# Patient Record
Sex: Female | Born: 1990 | Race: White | Hispanic: No | Marital: Single | Smoking: Never smoker
Health system: Southern US, Community
[De-identification: ages and names within clinical notes are randomized; demographics above are authoritative.]

## PROBLEM LIST (undated history)

## (undated) DIAGNOSIS — K219 Gastro-esophageal reflux disease without esophagitis: Secondary | ICD-10-CM

## (undated) DIAGNOSIS — R519 Headache, unspecified: Secondary | ICD-10-CM

## (undated) DIAGNOSIS — Z789 Other specified health status: Secondary | ICD-10-CM

## (undated) DIAGNOSIS — F32A Depression, unspecified: Secondary | ICD-10-CM

## (undated) DIAGNOSIS — F419 Anxiety disorder, unspecified: Secondary | ICD-10-CM

## (undated) DIAGNOSIS — M1990 Unspecified osteoarthritis, unspecified site: Secondary | ICD-10-CM

## (undated) HISTORY — PX: NO PAST SURGERIES: SHX2092

## (undated) HISTORY — PX: TUBAL LIGATION: SHX77

---

## 2013-09-23 ENCOUNTER — Telehealth: Payer: Self-pay | Admitting: Nurse Practitioner

## 2013-09-23 NOTE — Telephone Encounter (Signed)
appt made

## 2013-09-29 ENCOUNTER — Ambulatory Visit (INDEPENDENT_AMBULATORY_CARE_PROVIDER_SITE_OTHER): Payer: Self-pay | Admitting: Family Medicine

## 2013-09-29 NOTE — Progress Notes (Signed)
   Subjective:    Patient ID: Kristin SheenJillian Haueter, female    DOB: 1991/04/30, 23 y.o.   MRN: [id number]  HPI   Review of Systems     Objective:   Physical Exam      Assessment & Plan:

## 2014-02-16 ENCOUNTER — Telehealth: Payer: Self-pay | Admitting: Family Medicine

## 2014-02-16 NOTE — Telephone Encounter (Signed)
appt given for tomorrow with mmm 

## 2014-02-17 ENCOUNTER — Ambulatory Visit: Payer: Self-pay | Admitting: Nurse Practitioner

## 2014-02-19 ENCOUNTER — Encounter (INDEPENDENT_AMBULATORY_CARE_PROVIDER_SITE_OTHER): Payer: Self-pay

## 2014-02-19 ENCOUNTER — Ambulatory Visit: Payer: Medicaid Other | Admitting: Family

## 2014-04-08 ENCOUNTER — Encounter (INDEPENDENT_AMBULATORY_CARE_PROVIDER_SITE_OTHER): Payer: Self-pay

## 2014-04-08 ENCOUNTER — Ambulatory Visit (INDEPENDENT_AMBULATORY_CARE_PROVIDER_SITE_OTHER): Payer: Medicaid Other | Admitting: Family

## 2014-04-08 ENCOUNTER — Encounter: Payer: Self-pay | Admitting: Family

## 2014-04-08 VITALS — BP 106/66 | HR 79 | Temp 99.1°F | Ht 64.0 in | Wt 212.0 lb

## 2014-04-08 DIAGNOSIS — N6320 Unspecified lump in the left breast, unspecified quadrant: Principal | ICD-10-CM

## 2014-04-08 DIAGNOSIS — N6310 Unspecified lump in the right breast, unspecified quadrant: Secondary | ICD-10-CM

## 2014-04-08 DIAGNOSIS — N63 Unspecified lump in unspecified breast: Secondary | ICD-10-CM

## 2014-04-08 NOTE — Patient Instructions (Signed)

## 2014-04-08 NOTE — Progress Notes (Signed)
   Subjective:    Patient ID: Kristin Saunders, female    DOB: October 28, 1990, 23 y.o.   MRN: [id number]  HPI Pt presents to the office for bilateral lumps in breasts. Pt states she noticed them about a month ago. Pt states she gets a "werid feeling and then sharp intermittent pain" in her breast. Pt denies any known trauma to the area except a "really bad sunburn". Pt denies any family hx of breast cancer.    Review of Systems  Constitutional: Negative.   HENT: Negative.   Eyes: Negative.   Respiratory: Negative.  Negative for shortness of breath.   Cardiovascular: Negative.  Negative for palpitations.  Gastrointestinal: Negative.   Endocrine: Negative.   Genitourinary: Negative.   Musculoskeletal: Negative.   Neurological: Negative.  Negative for headaches.  Hematological: Negative.   Psychiatric/Behavioral: Negative.   All other systems reviewed and are negative.      Objective:   Physical Exam  Vitals reviewed. Constitutional: She is oriented to person, place, and time. She appears well-developed and well-nourished. No distress.  Cardiovascular: Normal rate, regular rhythm, normal heart sounds and intact distal pulses.   No murmur heard. Pulmonary/Chest: Effort normal and breath sounds normal. No respiratory distress. She has no wheezes. Right breast exhibits tenderness. Right breast exhibits no inverted nipple, no mass (small nodule about nippled), no nipple discharge and no skin change. Left breast exhibits tenderness. Left breast exhibits no inverted nipple, no mass (small nodule above nipple), no nipple discharge and no skin change. Breasts are symmetrical.  Abdominal: Soft. Bowel sounds are normal. She exhibits no distension. There is no tenderness.  Musculoskeletal: Normal range of motion. She exhibits no edema and no tenderness.  Neurological: She is alert and oriented to person, place, and time. She has normal reflexes. No cranial nerve deficit.  Skin: Skin is warm and dry.    Psychiatric: She has a normal mood and affect. Her behavior is normal. Judgment and thought content normal.    BP 106/66  Pulse 79  Temp(Src) 99.1 F (37.3 C) (Oral)  Ht 5\' 4"  (1.626 m)  Wt 212 lb (96.163 kg)  BMI 36.37 kg/m2  LMP 03/04/2014      Assessment & Plan:  1. Bilateral breast lump -Mammogram scheduled- I do not believe that it is cancerous, and is more hormonal, but will do mammogram to rule out anything -Self breast exams monthly -RTO prn  Jannifer Rodneyhristy Hawks, FNP

## 2014-05-18 ENCOUNTER — Telehealth: Payer: Self-pay | Admitting: Family

## 2014-05-18 NOTE — Telephone Encounter (Signed)
Patient advised to call her OBGYN

## 2014-08-03 LAB — US OB COMP + 14 WK

## 2014-09-28 LAB — US OB FOLLOW UP

## 2014-09-30 ENCOUNTER — Encounter (HOSPITAL_COMMUNITY): Payer: Self-pay | Admitting: Unknown Physician Specialty

## 2014-09-30 ENCOUNTER — Other Ambulatory Visit (HOSPITAL_COMMUNITY): Payer: Self-pay | Admitting: Unknown Physician Specialty

## 2014-09-30 DIAGNOSIS — Z3689 Encounter for other specified antenatal screening: Secondary | ICD-10-CM

## 2014-09-30 DIAGNOSIS — O35EXX1 Maternal care for other (suspected) fetal abnormality and damage, fetal genitourinary anomalies, fetus 1: Secondary | ICD-10-CM

## 2014-09-30 DIAGNOSIS — O358XX1 Maternal care for other (suspected) fetal abnormality and damage, fetus 1: Secondary | ICD-10-CM

## 2014-09-30 DIAGNOSIS — O283 Abnormal ultrasonic finding on antenatal screening of mother: Secondary | ICD-10-CM

## 2014-10-08 ENCOUNTER — Ambulatory Visit (HOSPITAL_COMMUNITY): Admission: RE | Admit: 2014-10-08 | Payer: Medicaid Other | Source: Ambulatory Visit

## 2014-10-15 ENCOUNTER — Ambulatory Visit (HOSPITAL_COMMUNITY): Admission: RE | Admit: 2014-10-15 | Payer: Medicaid Other | Source: Ambulatory Visit

## 2014-11-04 ENCOUNTER — Other Ambulatory Visit (HOSPITAL_COMMUNITY): Payer: Self-pay | Admitting: Unknown Physician Specialty

## 2014-11-04 DIAGNOSIS — IMO0002 Reserved for concepts with insufficient information to code with codable children: Secondary | ICD-10-CM

## 2014-11-04 DIAGNOSIS — Z0489 Encounter for examination and observation for other specified reasons: Secondary | ICD-10-CM

## 2014-11-10 ENCOUNTER — Ambulatory Visit (HOSPITAL_COMMUNITY)
Admission: RE | Admit: 2014-11-10 | Discharge: 2014-11-10 | Disposition: A | Discharge status: Home / Self Care | Payer: Medicaid Other | Source: Ambulatory Visit | Attending: Unknown Physician Specialty | Admitting: Unknown Physician Specialty

## 2014-11-10 ENCOUNTER — Encounter (HOSPITAL_COMMUNITY): Payer: Self-pay

## 2014-11-10 DIAGNOSIS — O359XX0 Maternal care for (suspected) fetal abnormality and damage, unspecified, not applicable or unspecified: Secondary | ICD-10-CM | POA: Insufficient documentation

## 2014-11-10 DIAGNOSIS — IMO0002 Reserved for concepts with insufficient information to code with codable children: Secondary | ICD-10-CM

## 2014-11-10 DIAGNOSIS — Z3A35 35 weeks gestation of pregnancy: Secondary | ICD-10-CM | POA: Diagnosis not present

## 2014-11-10 DIAGNOSIS — Z3689 Encounter for other specified antenatal screening: Secondary | ICD-10-CM | POA: Insufficient documentation

## 2014-11-10 DIAGNOSIS — O283 Abnormal ultrasonic finding on antenatal screening of mother: Secondary | ICD-10-CM | POA: Insufficient documentation

## 2014-11-10 DIAGNOSIS — Z0489 Encounter for examination and observation for other specified reasons: Secondary | ICD-10-CM

## 2014-11-10 DIAGNOSIS — O358XX0 Maternal care for other (suspected) fetal abnormality and damage, not applicable or unspecified: Secondary | ICD-10-CM | POA: Insufficient documentation

## 2014-11-10 DIAGNOSIS — Z36 Encounter for antenatal screening of mother: Secondary | ICD-10-CM | POA: Diagnosis not present

## 2014-11-10 DIAGNOSIS — O35EXX0 Maternal care for other (suspected) fetal abnormality and damage, fetal genitourinary anomalies, not applicable or unspecified: Secondary | ICD-10-CM | POA: Insufficient documentation

## 2014-11-10 HISTORY — DX: Other specified health status: Z78.9

## 2014-11-10 IMAGING — US US OB DETAIL+14 WK
1 series · 12 of 28 positions shown · non-contrast
Comparison: none

[Series 1: us ob detail+14 wk · 0.26mm/px · 79 acquisitions, 12 frames shown]
[im 3/79]
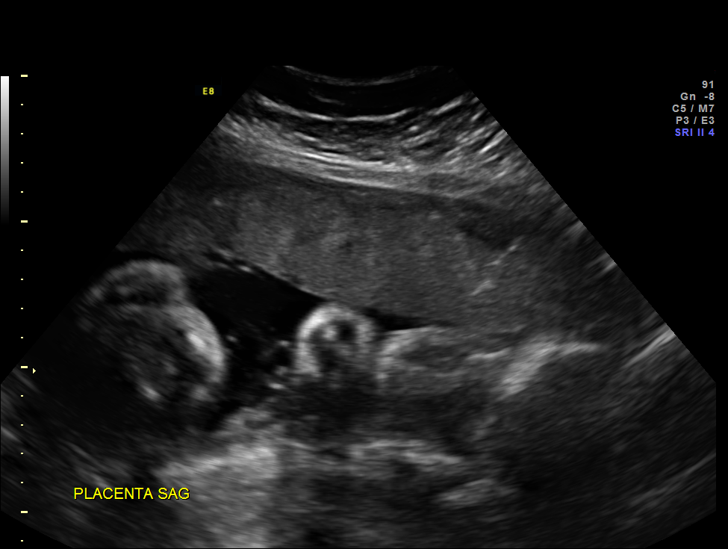
[im 9/79]
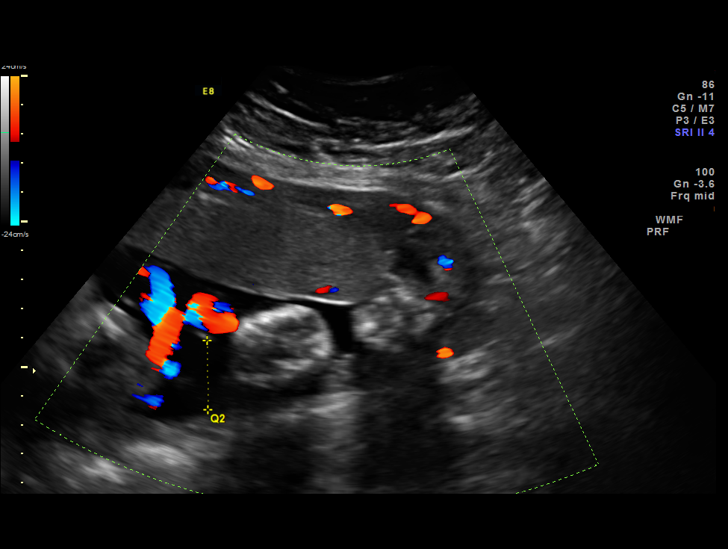
[im 15/79]
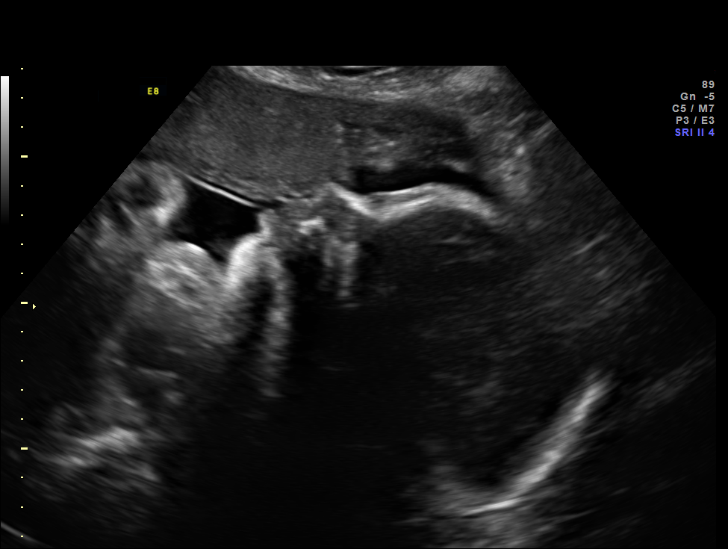
[im 24/79]
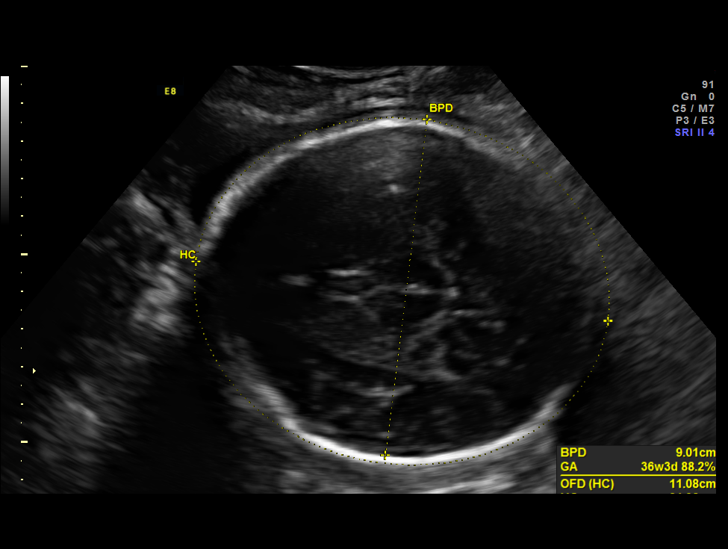
[im 29/79]
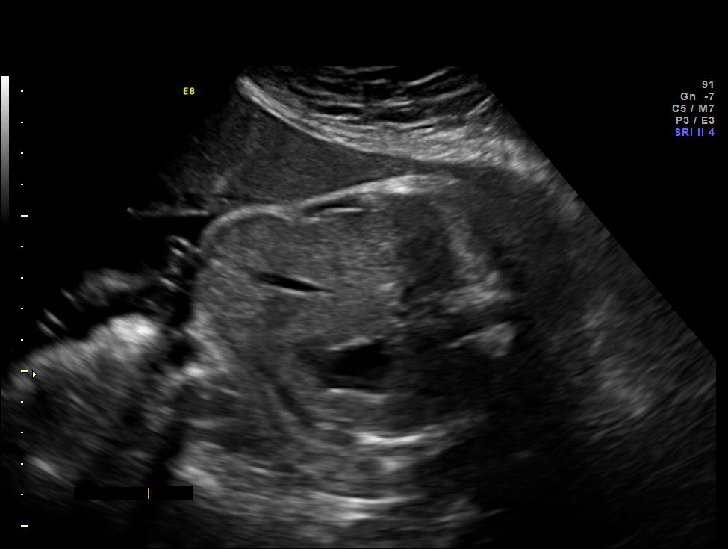
[im 35/79]
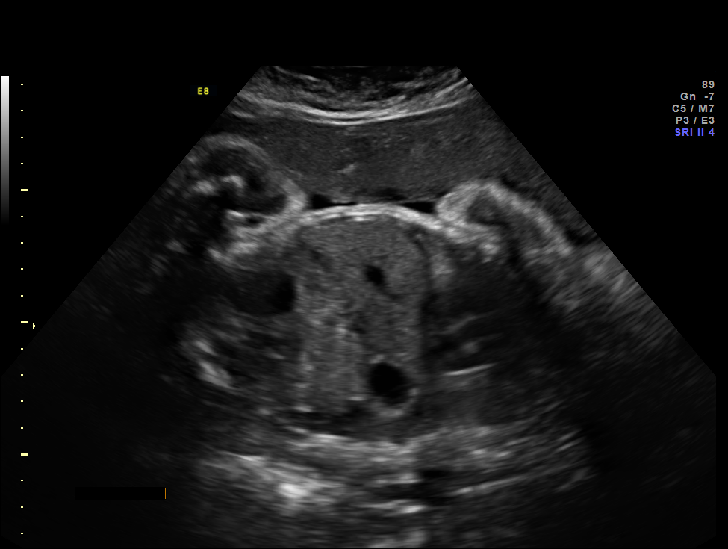
[im 44/79]
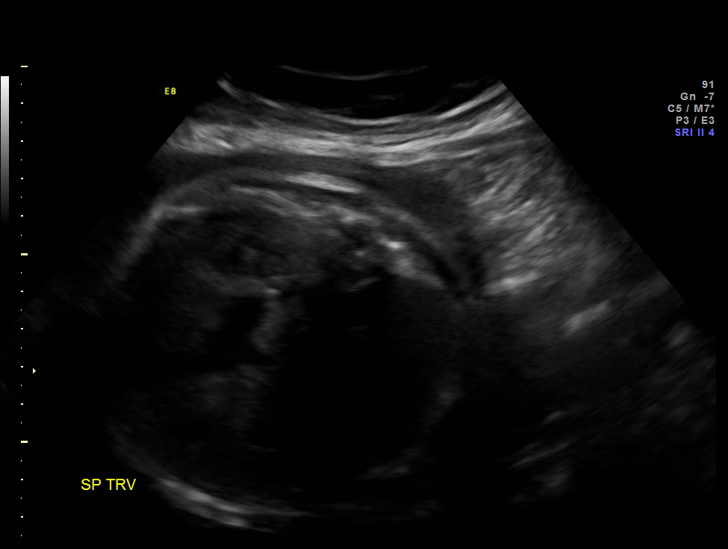
[im 50/79]
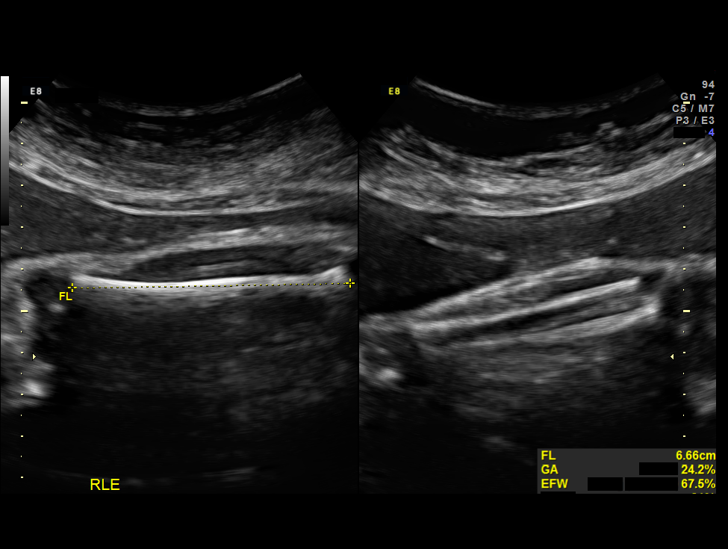
[im 55/79]
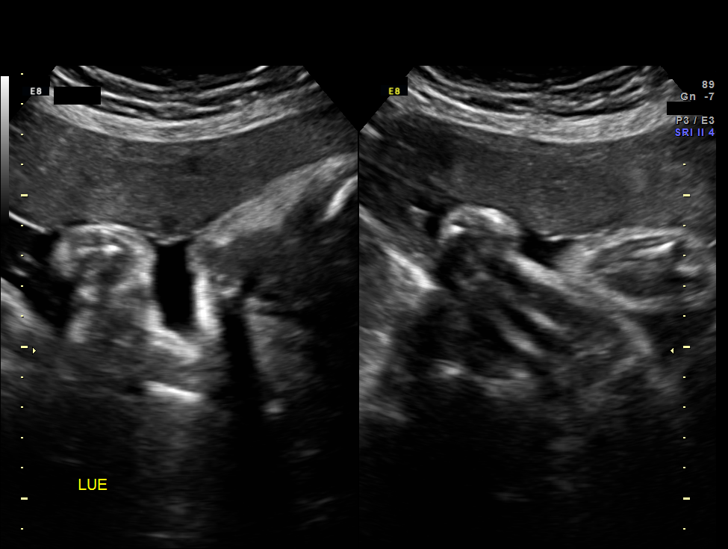
[im 64/79]
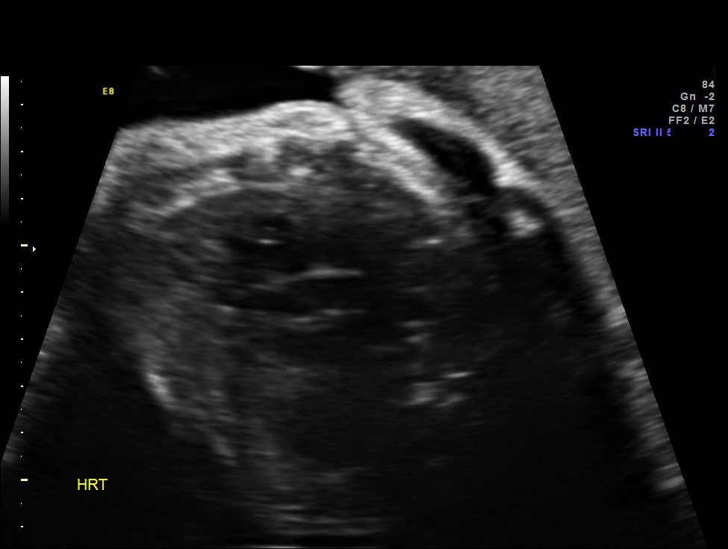
[im 70/79]
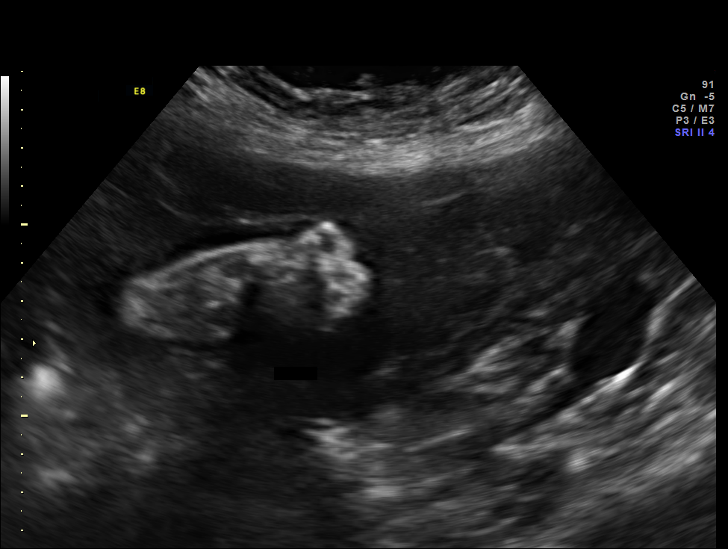
[im 76/79]
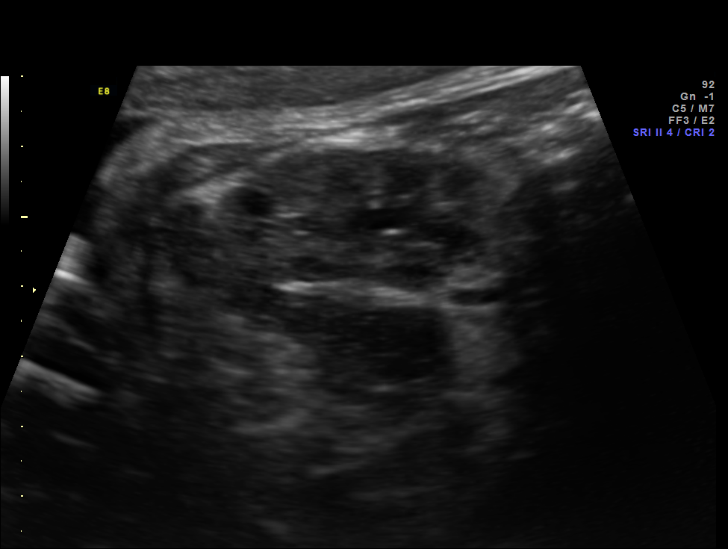

[12 of 28 positions shown; findings below may reference images not displayed]

OBSTETRICS REPORT
                      (Signed Final [DATE] [DATE])

Service(s) Provided

 US OB DETAIL + 14 WK                                  76811.0
Indications

 35 weeks gestation of pregnancy
 Detailed fetal anatomic survey                        Z36
 Pyelectasis of fetus on prenatal ultrasound           [IU]
Fetal Evaluation

 Num Of Fetuses:    1
 Fetal Heart Rate:  148                          bpm
 Cardiac Activity:  Observed
 Presentation:      Cephalic
 Placenta:          Anterior, above cervical os
 P. Cord            Visualized, central
 Insertion:

 Amniotic Fluid
 AFI FV:      Subjectively within normal limits
 AFI Sum:     11.9    cm       35  %Tile     Larg Pckt:    5.94  cm
 RUQ:   5.94    cm   LUQ:    2.38   cm    LLQ:   3.58    cm
Biometry

 BPD:     89.5  mm     G. Age:  36w 2d                CI:         83.0   70 - 86
 OFD:    107.8  mm                                    FL/HC:      21.1   20.1 -

 HC:     314.7  mm     G. Age:  35w 2d       24  %    HC/AC:      0.99   0.93 -

 AC:     318.6  mm     G. Age:  35w 5d       77  %    FL/BPD:     74.2   71 - 87
 FL:      66.4  mm     G. Age:  34w 1d       23  %    FL/AC:      20.8   20 - 24
 HUM:     58.7  mm     G. Age:  34w 0d       47  %
 CER:     45.2  mm     G. Age:  N/A          88  %

 Est. FW:    [IU]  gm    5 lb 14 oz      69  %
Gestational Age

 LMP:           36w 0d        Date:  [DATE]                 EDD:   [DATE]
 U/S Today:     35w 3d                                        EDD:   [DATE]
 Best:          35w 0d     Det. By:  Early Ultrasound         EDD:   [DATE]
                                     ([DATE])
Anatomy

 Cranium:          Appears normal         Aortic Arch:      Appears normal
 Fetal Cavum:      Appears normal         Ductal Arch:      Appears normal
                                                            (color)
 Ventricles:       Appears normal         Diaphragm:        Appears normal
 Choroid Plexus:   Appears normal         Stomach:          Appears normal, left
                                                            sided
 Cerebellum:       Appears normal         Abdomen:          Appears normal
 Posterior Fossa:  Appears normal         Abdominal Wall:   Appears nml (cord
                                                            insert, abd wall)
 Nuchal Fold:      Not applicable (>20    Cord Vessels:     Appears normal (3
                   wks GA)                                  vessel cord)
 Face:             Appears normal         Kidneys:          Appear normal
                   (orbits and profile)
 Lips:             Appears normal         Bladder:          Appears normal
 Heart:            Appears normal         Spine:            Appears normal
                   (4CH, axis, and
                   situs)
 RVOT:             Appears normal         Lower             Visualized
                                          Extremities:
 LVOT:             Appears normal         Upper             Visualized
                                          Extremities:

 Other:  Fetus appears to be a female. Nasal bone visualized. Technically
         difficult due to advanced GA and fetal position.
Targeted Anatomy

 Fetal Central Nervous System
 Cisterna Magna:
Cervix Uterus Adnexa

 Cervix:       Not visualized (advanced GA >[IU])

 Adnexa:     No abnormality visualized.
Impression

 SIUP at 35+0 weeks
 Normal detailed fetal anatomy; no pyelectasis identified
 Normal amniotic fluid volume
 Measurements consistent with early US; EFW at the 69th
 %tile
Recommendations

 Follow-up as clinically indicated

 questions or concerns.

## 2014-11-13 ENCOUNTER — Other Ambulatory Visit (HOSPITAL_COMMUNITY): Payer: Self-pay | Admitting: Unknown Physician Specialty

## 2015-04-06 ENCOUNTER — Encounter: Payer: Self-pay | Admitting: Family Medicine

## 2015-04-06 ENCOUNTER — Ambulatory Visit (INDEPENDENT_AMBULATORY_CARE_PROVIDER_SITE_OTHER): Payer: Medicaid Other

## 2015-04-06 ENCOUNTER — Ambulatory Visit (INDEPENDENT_AMBULATORY_CARE_PROVIDER_SITE_OTHER): Payer: Medicaid Other | Admitting: Family Medicine

## 2015-04-06 VITALS — BP 108/69 | HR 83 | Temp 97.6°F | Ht 64.0 in | Wt 206.6 lb

## 2015-04-06 DIAGNOSIS — M25572 Pain in left ankle and joints of left foot: Secondary | ICD-10-CM | POA: Diagnosis not present

## 2015-04-06 IMAGING — CR DG ANKLE COMPLETE 3+V*L*
3 series · 3 of 3 positions shown · non-contrast
Comparison: None.

CLINICAL DATA: Missed the last step twisting ankle with pain and
swelling

EXAM:
LEFT ANKLE COMPLETE - 3+ VIEW

[view not recorded (1 of 3)]
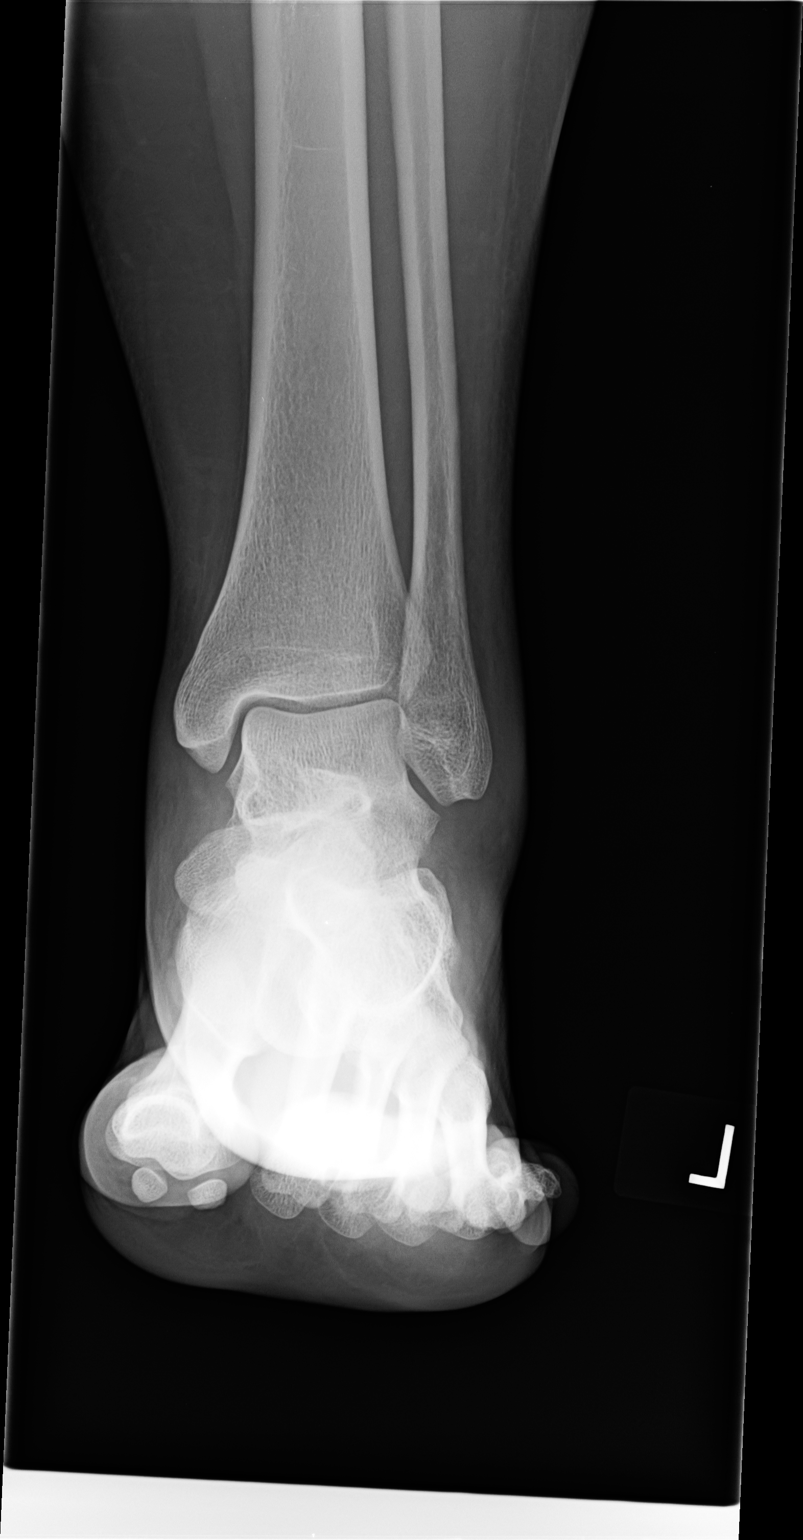

[view not recorded (2 of 3)]
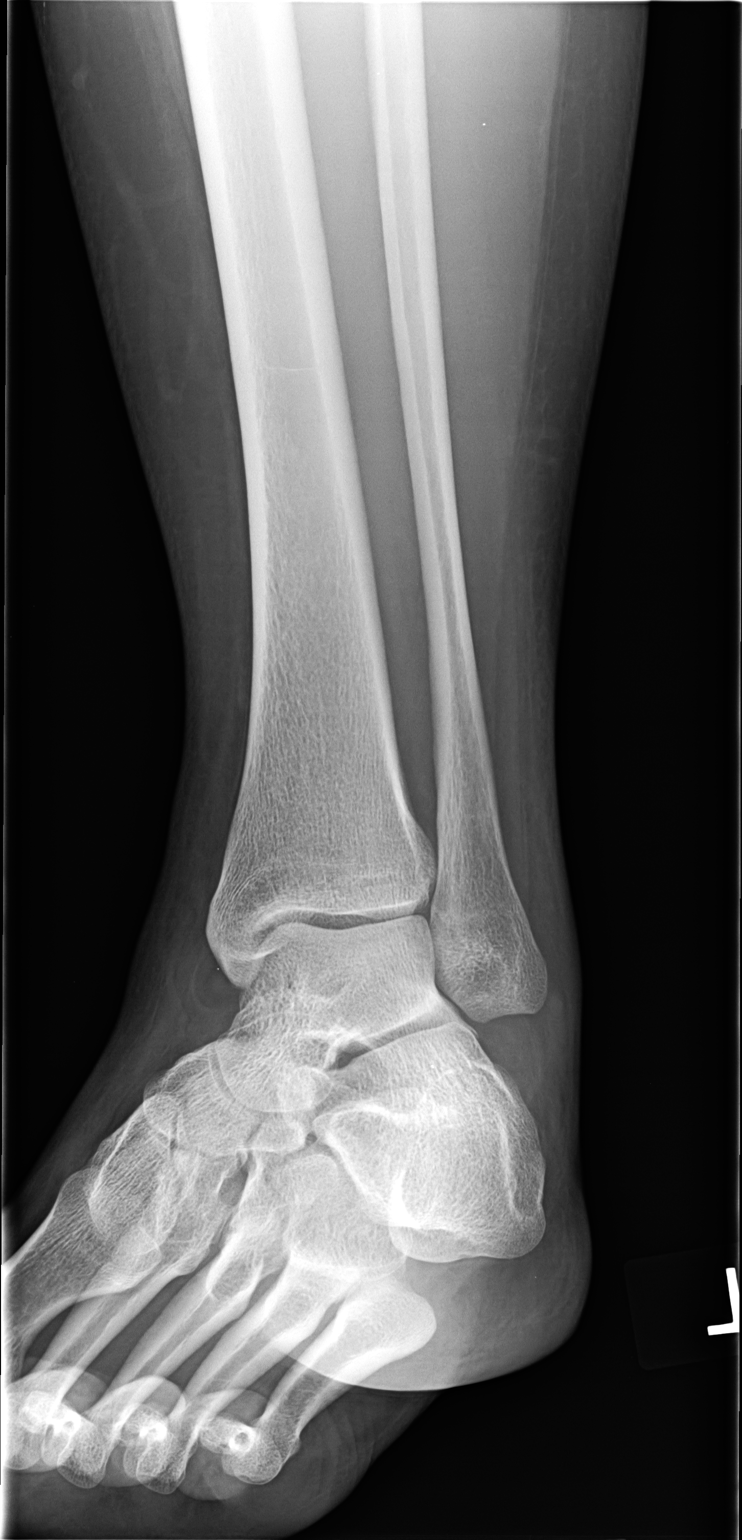

[view not recorded (3 of 3)]
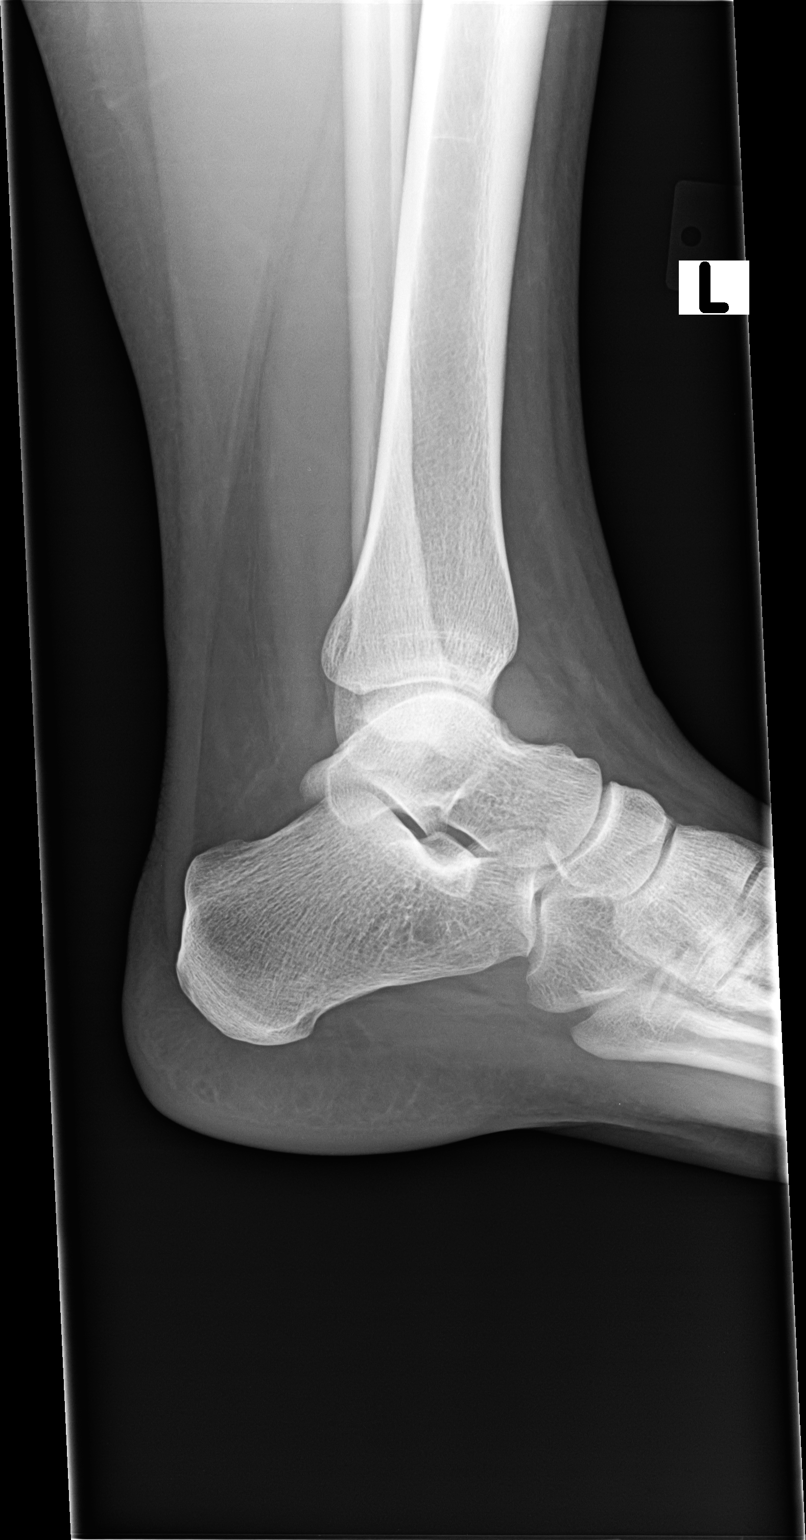

[3 of 3 positions shown; findings below may reference images not displayed]

FINDINGS: The ankle joint appears normal. No fracture is seen. There may be a
small ankle joint effusion on the lateral view. Alignment is normal.
IMPRESSION: No fracture.  Cannot exclude a small left ankle joint effusion.

## 2015-04-06 MED ORDER — ANKLE STIRRUP BRACE/LEFT MISC: Status: DC

## 2015-04-06 MED ORDER — DICLOFENAC SODIUM 75 MG PO TBEC: 75.0000 mg | DELAYED_RELEASE_TABLET | Freq: Two times a day (BID) | ORAL | Status: DC

## 2015-04-06 NOTE — Progress Notes (Signed)
Subjective:  Patient ID: Kristin Saunders, female    DOB: [birth year]  Age: 24 y.o. MRN: [id number]  CC: Ankle Pain   HPI [name] presents for patient to twisted her ankle ambulating on uneven ground about 10 days ago. Since that time it has been painful. She does not do anything in particular to take care of it. She has applied ice on several occasions. Pain is rated at 4/10. It just doesn't seem to be getting better like she thought it would.  History [name] has a past medical history of Medical history non-contributory.   She has past surgical history that includes No past surgeries.   Her family history includes Cancer in her father.She reports that she has never smoked. She does not have any smokeless tobacco history on file. She reports that she drinks alcohol. She reports that she does not use illicit drugs.  Outpatient Prescriptions Prior to Visit  Medication Sig Dispense Refill  . Prenatal Multivit-Min-Fe-FA (PRENATAL VITAMINS PO) Take by mouth.     No facility-administered medications prior to visit.    ROS Review of Systems  Constitutional: Negative for fever, chills, diaphoresis, appetite change and fatigue.  HENT: Negative for congestion, ear pain, hearing loss, postnasal drip, rhinorrhea, sore throat and trouble swallowing.   Respiratory: Negative for cough, chest tightness and shortness of breath.   Cardiovascular: Negative for chest pain and palpitations.  Gastrointestinal: Negative for abdominal pain.  Musculoskeletal: Negative for arthralgias (except as noted in istory of present illne).  Skin: Negative for rash.    Objective:  BP 108/69 mmHg  Pulse 83  Temp(Src) 97.6 F (36.4 C) (Oral)  Ht  (1.626 m)  Wt 206 lb 9.6 oz (93.713 kg)  BMI 35.45 kg/m2  LMP 02/23/2014 (Approximate)  Breastfeeding? Unknown  BP Readings from Last 3 Encounters:  04/06/15 108/69  11/10/14 99/61  04/08/14 106/66    Wt Readings from Last 3 Encounters:  04/06/15  206 lb 9.6 oz (93.713 kg)  11/10/14 214 lb (97.07 kg)  04/08/14 212 lb (96.163 kg)     Physical Exam  Constitutional: She appears well-developed and well-nourished. No distress.  HENT:  Head: Normocephalic.  Cardiovascular: Normal rate and regular rhythm.   No murmur heard. Pulmonary/Chest: Effort normal and breath sounds normal.  Musculoskeletal: She exhibits edema and tenderness (both at right ankle. The lateral malleolus has 2+ edema and moderate tenderness).    No results found for: HGBA1C  No results found for: WBC, HGB, HCT, PLT, GLUCOSE, CHOL, TRIG, HDL, LDLDIRECT, LDLCALC, ALT, AST, NA, K, CL, CREATININE, BUN, CO2, TSH, PSA, INR, GLUF, HGBA1C, MICROALBUR  US Ob Detail + 14 Wk  11/10/2014   OBSTETRICAL ULTRASOUND: This exam was performed within a [facility]. The OB US report was generated in the [facility] system, and faxed to the ordering physician.   This report is available in the [employer]. See the [facility] Obstetric US report via the Image Link.   Assessment & Plan:   Athea was seen today for ankle pain.  Diagnoses and all orders for this visit:  Left lateral ankle pain -     DG Ankle Complete Left; Future  Other orders -     Elastic Bandages & Supports (ANKLE STIRRUP BRACE/LEFT) MISC; Wear daily until pain, swelling and weakness resolve -     diclofenac (VOLTAREN) 75 MG EC tablet; Take 1 tablet (75 mg total) by mouth 2 (two) times daily.  I have discontinued [name]'s Prenatal Multivit-Min-Fe-FA (PRENATAL  VITAMINS PO). I am also having her start on Ankle Stirrup Brace/Left and diclofenac.  Meds ordered this encounter  Medications  . Elastic Bandages & Supports (ANKLE STIRRUP BRACE/LEFT) MISC    Sig: Wear daily until pain, swelling and weakness resolve    Dispense:  1 each    Refill:  0  . diclofenac (VOLTAREN) 75 MG EC tablet    Sig: Take 1 tablet (75 mg total) by mouth 2 (two) times daily.    Dispense:  60 tablet    Refill:  2     Preliminary reading x-ray shows no apparent fracture. Perhaps minimal soft tissue swelling. Follow-up: Return in about 2 weeks (around 04/20/2015).  [name], M.D.

## 2015-04-06 NOTE — Patient Instructions (Signed)
Ankle Sprain °An ankle sprain is an injury to the strong, fibrous tissues (ligaments) that hold the bones of your ankle joint together.  °CAUSES °An ankle sprain is usually caused by a fall or by twisting your ankle. Ankle sprains most commonly occur when you step on the outer edge of your foot, and your ankle turns inward. People who participate in sports are more prone to these types of injuries.  °SYMPTOMS  °· Pain in your ankle. The pain may be present at rest or only when you are trying to stand or walk. °· Swelling. °· Bruising. Bruising may develop immediately or within 1 to 2 days after your injury. °· Difficulty standing or walking, particularly when turning corners or changing directions. °DIAGNOSIS  °Your caregiver will ask you details about your injury and perform a physical exam of your ankle to determine if you have an ankle sprain. During the physical exam, your caregiver will press on and apply pressure to specific areas of your foot and ankle. Your caregiver will try to move your ankle in certain ways. An X-ray exam may be done to be sure a bone was not broken or a ligament did not separate from one of the bones in your ankle (avulsion fracture).  °TREATMENT  °Certain types of braces can help stabilize your ankle. Your caregiver can make a recommendation for this. Your caregiver may recommend the use of medicine for pain. If your sprain is severe, your caregiver may refer you to a surgeon who helps to restore function to parts of your skeletal system (orthopedist) or a physical therapist. °HOME CARE INSTRUCTIONS  °· Apply ice to your injury for 1-2 days or as directed by your caregiver. Applying ice helps to reduce inflammation and pain. °¨ Put ice in a plastic bag. °¨ Place a towel between your skin and the bag. °¨ Leave the ice on for 15-20 minutes at a time, every 2 hours while you are awake. °· Only take over-the-counter or prescription medicines for pain, discomfort, or fever as directed by  your caregiver. °· Elevate your injured ankle above the level of your heart as much as possible for 2-3 days. °· If your caregiver recommends crutches, use them as instructed. Gradually put weight on the affected ankle. Continue to use crutches or a cane until you can walk without feeling pain in your ankle. °· If you have a plaster splint, wear the splint as directed by your caregiver. Do not rest it on anything harder than a pillow for the first 24 hours. Do not put weight on it. Do not get it wet. You may take it off to take a shower or bath. °· You may have been given an elastic bandage to wear around your ankle to provide support. If the elastic bandage is too tight (you have numbness or tingling in your foot or your foot becomes cold and blue), adjust the bandage to make it comfortable. °· If you have an air splint, you may blow more air into it or let air out to make it more comfortable. You may take your splint off at night and before taking a shower or bath. Wiggle your toes in the splint several times per day to decrease swelling. °SEEK MEDICAL CARE IF:  °· You have rapidly increasing bruising or swelling. °· Your toes feel extremely cold or you lose feeling in your foot. °· Your pain is not relieved with medicine. °SEEK IMMEDIATE MEDICAL CARE IF: °· Your toes are numb or blue. °·   You have severe pain that is increasing. °MAKE SURE YOU:  °· Understand these instructions. °· Will watch your condition. °· Will get help right away if you are not doing well or get worse. °Document Released: 08/07/2005 Document Revised: 05/01/2012 Document Reviewed: 08/19/2011 °ExitCare® Patient Information ©2015 [employer]. This information is not intended to replace advice given to you by your health care provider. Make sure you discuss any questions you have with your health care provider. ° ° °Ankle Exercises for Rehabilitation °Following ankle injuries, it is as important to follow your caregiver's instructions for  regaining full use of your ankle as it was to follow the initial treatment plan following the injury. The following are some suggestions for exercises and treatment, which can be done to help you regain full use of your ankle as soon as possible. °· Follow all instructions regarding physical therapy. °· Before exercising, it may be helpful to use heat on the muscles or joint being exercised. This loosens up the muscles and tendons (cordlike structure) and decreases chances of injury during your exercises. If this is not possible, just begin your exercises slowly to gradually warm up. °· Stand on your toes several times per day to strengthen the calf muscles. These are the muscles in the back of your leg between the knee and the heel. The cord you can feel just above the heel is the Achilles tendon. Rise up on your toes several times repeating this three to four times per day. Do not exercise to the point of pain. If pain starts to develop, decrease the exercise until you are comfortable again. °· Do range of motion exercises. This means moving the ankle in all directions. Practice writing the alphabet with your toes in the air. Do not increase beyond a range that is comfortable. °· Increase the strength of the muscles in the front of your leg by raising your toes and foot straight up in the air. Repeat this exercise as you did the calf exercise with the same warnings. This also help to stretch your muscles. °· Stretch your calf muscles also by leaning against a wall with your hands in front of you. Put your feet a few feet from the wall and bend your knees until you feel the muscles in your calves become tight. °· After exercising it may be helpful to put ice on the ankle to prevent swelling and improve rehabilitation. This may be done for 15 to 20 minutes following your exercises. If exercising is being done in the workplace, this may not always be possible. °· Taping an ankle injury may be helpful to give added  support following an injury. It also may help prevent reinjury. This may be true if you are in training or in a conditioning program. You and your caregiver can decide on the best course of action to follow. °Document Released: 08/04/2000 Document Revised: 12/22/2013 Document Reviewed: 08/01/2008 °ExitCare® Patient Information ©2015 [employer]. This information is not intended to replace advice given to you by your health care provider. Make sure you discuss any questions you have with your health care provider. ° ° °

## 2015-04-08 ENCOUNTER — Encounter: Payer: Self-pay | Admitting: Family Medicine

## 2015-07-23 ENCOUNTER — Encounter: Payer: Self-pay | Admitting: Family

## 2015-07-23 ENCOUNTER — Ambulatory Visit (INDEPENDENT_AMBULATORY_CARE_PROVIDER_SITE_OTHER): Payer: Medicaid Other | Admitting: Family

## 2015-07-23 VITALS — BP 106/72 | HR 89 | Temp 97.9°F | Ht 64.0 in | Wt 203.6 lb

## 2015-07-23 DIAGNOSIS — M722 Plantar fascial fibromatosis: Secondary | ICD-10-CM | POA: Diagnosis not present

## 2015-07-23 DIAGNOSIS — Z23 Encounter for immunization: Secondary | ICD-10-CM | POA: Diagnosis not present

## 2015-07-23 MED ORDER — PREDNISONE 10 MG (21) PO TBPK: 10.0000 mg | ORAL_TABLET | Freq: Every day | ORAL | Status: DC

## 2015-07-23 MED ORDER — NAPROXEN 500 MG PO TABS: 500.0000 mg | ORAL_TABLET | Freq: Two times a day (BID) | ORAL | Status: DC

## 2015-07-23 NOTE — Patient Instructions (Signed)
Plantar Fasciitis Plantar fasciitis is a painful foot condition that affects the heel. It occurs when the band of tissue that connects the toes to the heel bone (plantar fascia) becomes irritated. This can happen after exercising too much or doing other repetitive activities (overuse injury). The pain from plantar fasciitis can range from mild irritation to severe pain that makes it difficult for you to walk or move. The pain is usually worse in the morning or after you have been sitting or lying down for a while. CAUSES This condition may be caused by:  Standing for long periods of time.  Wearing shoes that do not fit.  Doing high-impact activities, including running, aerobics, and ballet.  Being overweight.  Having an abnormal way of walking (gait).  Having tight calf muscles.  Having high arches in your feet.  Starting a new athletic activity. SYMPTOMS The main symptom of this condition is heel pain. Other symptoms include:  Pain that gets worse after activity or exercise.  Pain that is worse in the morning or after resting.  Pain that goes away after you walk for a few minutes. DIAGNOSIS This condition may be diagnosed based on your signs and symptoms. Your health care provider will also do a physical exam to check for:  A tender area on the bottom of your foot.  A high arch in your foot.  Pain when you move your foot.  Difficulty moving your foot. You may also need to have imaging studies to confirm the diagnosis. These can include:  X-rays.  Ultrasound.  MRI. TREATMENT  Treatment for plantar fasciitis depends on the severity of the condition. Your treatment may include:  Rest, ice, and over-the-counter pain medicines to manage your pain.  Exercises to stretch your calves and your plantar fascia.  A splint that holds your foot in a stretched, upward position while you sleep (night splint).  Physical therapy to relieve symptoms and prevent problems in the  future.  Cortisone injections to relieve severe pain.  Extracorporeal shock wave therapy (ESWT) to stimulate damaged plantar fascia with electrical impulses. It is often used as a last resort before surgery.  Surgery, if other treatments have not worked after 12 months. HOME CARE INSTRUCTIONS  Take medicines only as directed by your health care provider.  Avoid activities that cause pain.  Roll the bottom of your foot over a bag of ice or a bottle of cold water. Do this for 20 minutes, 3-4 times a day.  Perform simple stretches as directed by your health care provider.  Try wearing athletic shoes with air-sole or gel-sole cushions or soft shoe inserts.  Wear a night splint while sleeping, if directed by your health care provider.  Keep all follow-up appointments with your health care provider. PREVENTION   Do not perform exercises or activities that cause heel pain.  Consider finding low-impact activities if you continue to have problems.  Lose weight if you need to. The best way to prevent plantar fasciitis is to avoid the activities that aggravate your plantar fascia. SEEK MEDICAL CARE IF:  Your symptoms do not go away after treatment with home care measures.  Your pain gets worse.  Your pain affects your ability to move or do your daily activities.   This information is not intended to replace advice given to you by your health care provider. Make sure you discuss any questions you have with your health care provider.   Document Released: 05/02/2001 Document Revised: 04/28/2015 Document Reviewed: 06/17/2014 Elsevier   Interactive Patient Education 2016 [employer]  

## 2015-07-23 NOTE — Progress Notes (Signed)
   Subjective:    Patient ID: Kristin Saunders, female    DOB: 01-Aug-1991, 24 y.o.   MRN: [id number]  Foot Pain This is a new problem. The current episode started more than 1 month ago. The problem occurs intermittently. The problem has been waxing and waning. Pertinent negatives include no arthralgias, congestion, headaches, neck pain or swollen glands. The symptoms are aggravated by walking and standing. She has tried rest and acetaminophen for the symptoms. The treatment provided mild relief.      Review of Systems  Constitutional: Negative.   HENT: Negative.  Negative for congestion.   Eyes: Negative.   Respiratory: Negative.  Negative for shortness of breath.   Cardiovascular: Negative.  Negative for palpitations.  Gastrointestinal: Negative.   Endocrine: Negative.   Genitourinary: Negative.   Musculoskeletal: Negative.  Negative for arthralgias and neck pain.  Neurological: Negative.  Negative for headaches.  Hematological: Negative.   Psychiatric/Behavioral: Negative.   All other systems reviewed and are negative.      Objective:   Physical Exam  Constitutional: She is oriented to person, place, and time. She appears well-developed and well-nourished. No distress.  HENT:  Head: Normocephalic and atraumatic.  Eyes: Pupils are equal, round, and reactive to light.  Neck: Normal range of motion. Neck supple. No thyromegaly present.  Cardiovascular: Normal rate, regular rhythm, normal heart sounds and intact distal pulses.   No murmur heard. Pulmonary/Chest: Effort normal and breath sounds normal. No respiratory distress. She has no wheezes.  Abdominal: Soft. Bowel sounds are normal. She exhibits no distension. There is no tenderness.  Musculoskeletal: Normal range of motion. She exhibits no edema or tenderness.  Neurological: She is alert and oriented to person, place, and time. She has normal reflexes. No cranial nerve deficit.  Skin: Skin is warm and dry.  Psychiatric: She  has a normal mood and affect. Her behavior is normal. Judgment and thought content normal.  Vitals reviewed.   BP 106/72 mmHg  Pulse 89  Temp(Src) 97.9 F (36.6 C) (Oral)  Ht 5\' 4"  (1.626 m)  Wt 203 lb 9.6 oz (92.352 kg)  BMI 34.93 kg/m2  LMP 03/04/2014       Assessment & Plan:  1. Plantar fasciitis of left foot -Encouraged patient to lose weight -Take naproxen with food -Freeze a bottle of water and roll from heel to toe 20 min 2-3 times a day -Good support in shoes discussed -RTO prn  - naproxen (NAPROSYN) 500 MG tablet; Take 1 tablet (500 mg total) by mouth 2 (two) times daily with a meal.  Dispense: 30 tablet; Refill: 0 - predniSONE (STERAPRED UNI-PAK 21 TAB) 10 MG (21) TBPK tablet; Take 1 tablet (10 mg total) by mouth daily. As directed x 6 days  Dispense: 21 tablet; Refill: 0  Kristin Rodneyhristy Devine Klingel, FNP

## 2015-10-29 ENCOUNTER — Encounter: Payer: Self-pay | Admitting: Family

## 2015-10-29 ENCOUNTER — Ambulatory Visit (INDEPENDENT_AMBULATORY_CARE_PROVIDER_SITE_OTHER): Payer: Medicaid Other | Admitting: Family

## 2015-10-29 VITALS — BP 99/67 | HR 85 | Temp 99.4°F | Ht 64.0 in | Wt 202.0 lb

## 2015-10-29 DIAGNOSIS — L089 Local infection of the skin and subcutaneous tissue, unspecified: Secondary | ICD-10-CM

## 2015-10-29 DIAGNOSIS — S60451A Superficial foreign body of left index finger, initial encounter: Secondary | ICD-10-CM

## 2015-10-29 DIAGNOSIS — S60459A Superficial foreign body of unspecified finger, initial encounter: Principal | ICD-10-CM

## 2015-10-29 MED ORDER — SULFAMETHOXAZOLE-TRIMETHOPRIM 800-160 MG PO TABS: 1.0000 | ORAL_TABLET | Freq: Two times a day (BID) | ORAL | Status: DC

## 2015-10-29 MED ORDER — MUPIROCIN 2 % EX OINT: 1.0000 "application " | TOPICAL_OINTMENT | Freq: Two times a day (BID) | CUTANEOUS | Status: DC

## 2015-10-29 NOTE — Patient Instructions (Addendum)
Wound Care °Taking care of your wound properly can help to prevent pain and infection. It can also help your wound to heal more quickly.  °HOW TO CARE FOR YOUR WOUND  °· Take or apply over-the-counter and prescription medicines only as told by your health care provider. °· If you were prescribed antibiotic medicine, take or apply it as told by your health care provider. Do not stop using the antibiotic even if your condition improves. °· Clean the wound each day or as told by your health care provider. °¨ Wash the wound with mild soap and water. °¨ Rinse the wound with water to remove all soap. °¨ Pat the wound dry with a clean towel. Do not rub it. °· There are many different ways to close and cover a wound. For example, a wound can be covered with stitches (sutures), skin glue, or adhesive strips. Follow instructions from your health care provider about: °¨ How to take care of your wound. °¨ When and how you should change your bandage (dressing). °¨ When you should remove your dressing. °¨ Removing whatever was used to close your wound. °· Check your wound every day for signs of infection. Watch for: °¨ Redness, swelling, or pain. °¨ Fluid, blood, or pus. °· Keep the dressing dry until your health care provider says it can be removed. Do not take baths, swim, use a hot tub, or do anything that would put your wound underwater until your health care provider approves. °· Raise (elevate) the injured area above the level of your heart while you are sitting or lying down. °· Do not scratch or pick at the wound. °· Keep all follow-up visits as told by your health care provider. This is important. °SEEK MEDICAL CARE IF: °· You received a tetanus shot and you have swelling, severe pain, redness, or bleeding at the injection site. °· You have a fever. °· Your pain is not controlled with medicine. °· You have increased redness, swelling, or pain at the site of your wound. °· You have fluid, blood, or pus coming from your  wound. °· You notice a bad smell coming from your wound or your dressing. °SEEK IMMEDIATE MEDICAL CARE IF: °· You have a red streak going away from your wound. °  °This information is not intended to replace advice given to you by your health care provider. Make sure you discuss any questions you have with your health care provider. °  °Document Released: 05/16/2008 Document Revised: 12/22/2014 Document Reviewed: 08/03/2014 °Elsevier Interactive Patient Education ©2016 Else[employer] Inc[employer] ° °

## 2015-10-29 NOTE — Progress Notes (Signed)
   Subjective:    Patient ID: Kristin SheenJillian Saunders, female    DOB: 1991/01/25, 25 y.o.   MRN: [id number]  HPI PT presents to the officce today for a splinter in her right index finger. Pt states she was "redoing her wooden floors and hit my finger on a piece of wood". Pt states the area has since become red and swollen and is having constant pain of 10 out 10. PT states she has soaked it in ice water and peroxide with no relief.    Review of Systems  Constitutional: Negative.   HENT: Negative.   Eyes: Negative.   Respiratory: Negative.  Negative for shortness of breath.   Cardiovascular: Negative.  Negative for palpitations.  Gastrointestinal: Negative.   Endocrine: Negative.   Genitourinary: Negative.   Musculoskeletal: Negative.   Neurological: Negative.  Negative for headaches.  Hematological: Negative.   Psychiatric/Behavioral: Negative.   All other systems reviewed and are negative.      Objective:   Physical Exam  Constitutional: She is oriented to person, place, and time. She appears well-developed and well-nourished. No distress.  HENT:  Mouth/Throat: Oropharynx is clear and moist.  Cardiovascular: Normal rate, regular rhythm, normal heart sounds and intact distal pulses.   No murmur heard. Pulmonary/Chest: Effort normal and breath sounds normal. No respiratory distress. She has no wheezes.  Musculoskeletal: Normal range of motion. She exhibits no edema.  Neurological: She is alert and oriented to person, place, and time.  Skin: Skin is warm and dry.  Psychiatric: She has a normal mood and affect. Her behavior is normal. Judgment and thought content normal.  Vitals reviewed.   Removed small splinter of right index finger. Mupirocin ointment applied      Assessment & Plan:  1. Foreign body in finger-infected, initial encounter - sulfamethoxazole-trimethoprim (BACTRIM DS) 800-160 MG tablet; Take 1 tablet by mouth 2 (two) times daily.  Dispense: 14 tablet; Refill: 0 -  mupirocin ointment (BACTROBAN) 2 %; Place 1 application into the nose 2 (two) times daily.  Dispense: 22 g; Refill: 0  2. Infection of index finger - sulfamethoxazole-trimethoprim (BACTRIM DS) 800-160 MG tablet; Take 1 tablet by mouth 2 (two) times daily.  Dispense: 14 tablet; Refill: 0 - mupirocin ointment (BACTROBAN) 2 %; Place 1 application into the nose 2 (two) times daily.  Dispense: 22 g; Refill: 0  Keep clean and dry Do not squeeze or pick at RTO prn  Jannifer Rodneyhristy Catricia Scheerer, FNP

## 2016-10-23 DIAGNOSIS — R079 Chest pain, unspecified: Secondary | ICD-10-CM | POA: Diagnosis not present

## 2016-10-23 DIAGNOSIS — R0789 Other chest pain: Secondary | ICD-10-CM | POA: Diagnosis not present

## 2018-01-07 ENCOUNTER — Encounter: Payer: Self-pay | Admitting: Pediatrics

## 2018-01-07 ENCOUNTER — Ambulatory Visit (INDEPENDENT_AMBULATORY_CARE_PROVIDER_SITE_OTHER): Payer: Medicaid Other | Admitting: Pediatrics

## 2018-01-07 VITALS — BP 92/65 | HR 116 | Temp 99.5°F | Resp 22 | Ht 64.0 in | Wt 190.0 lb

## 2018-01-07 DIAGNOSIS — R0981 Nasal congestion: Secondary | ICD-10-CM

## 2018-01-07 DIAGNOSIS — H6501 Acute serous otitis media, right ear: Secondary | ICD-10-CM

## 2018-01-07 MED ORDER — CETIRIZINE HCL 10 MG PO TABS: 10.0000 mg | ORAL_TABLET | Freq: Every day | ORAL | 11 refills | Status: DC

## 2018-01-07 MED ORDER — AMOXICILLIN 875 MG PO TABS: 875.0000 mg | ORAL_TABLET | Freq: Two times a day (BID) | ORAL | 0 refills | Status: DC

## 2018-01-07 MED ORDER — FLUTICASONE PROPIONATE 50 MCG/ACT NA SUSP: 2.0000 | Freq: Every day | NASAL | 6 refills | Status: DC

## 2018-01-07 NOTE — Patient Instructions (Signed)

## 2018-01-07 NOTE — Progress Notes (Signed)
  Subjective:   Patient ID: Kristin Saunders, female    DOB: 12-25-1990, 27 y.o.   MRN: [id number] CC: Cough and Nasal Congestion  HPI: Kristin Saunders is a 27 y.o. female   Symptoms for past week. Taking mucinex in the day, nyquil at night.  Having some pressure in her forehead.  Some headache off and on.  No shortness of breath.  Feels hot and cold at home, does not have a temperature when she checks it.  Appetite slightly down.  Drinking plenty fluids.  Throat hurts when she coughs.  Feels stopped up.  Not any worse over the last few days but also without improvement.   Relevant past medical, surgical, family and social history reviewed. Allergies and medications reviewed and updated. Social History   Tobacco Use  Smoking Status Never Smoker  Smokeless Tobacco Never Used   ROS: Per HPI   Objective:    BP 92/65   Pulse (!) 116   Temp 99.5 F (37.5 C) (Oral)   Resp (!) 22   Ht  (1.626 m)   Wt 190 lb (86.2 kg)   SpO2 96%   BMI 32.61 kg/m   Wt Readings from Last 3 Encounters:  01/07/18 190 lb (86.2 kg)  10/29/15 202 lb (91.6 kg)  07/23/15 203 lb 9.6 oz (92.4 kg)    Gen: NAD, alert, cooperative with exam, NCAT, congested EYES: EOMI, no conjunctival injection, or no icterus ENT: Right TM red, with serous effusion, OP without erythema LYMPH: no cervical LAD CV: NRRR, normal S1/S2, no murmur, distal pulses 2+ b/l Resp: CTABL, no wheezes, normal WOB Abd: +BS, soft, NTND. Ext: No edema, warm Neuro: Alert and oriented Skin: No rash  Assessment & Plan:  Adi was seen today for cough and nasal congestion, likely due to acute URI.  Does have a right sided ear effusion.  Any worsening ear symptoms, any worsening in symptoms should go ahead and start antibiotic.  Discussed may continue to have nasal discharge over the next few days before she starts to improve.  Any worsening let me know.  Diagnoses and all orders for this visit:  Nasal congestion Discussed symptom  Medicare. -     cetirizine (ZYRTEC) 10 MG tablet; Take 1 tablet (10 mg total) by mouth daily. -     fluticasone (FLONASE) 50 MCG/ACT nasal spray; Place 2 sprays into both nostrils daily.  Right acute serous otitis media, recurrence not specified -     amoxicillin (AMOXIL) 875 MG tablet; Take 1 tablet (875 mg total) by mouth 2 (two) times daily.   Follow up plan: Return if symptoms worsen or fail to improve. Rex Kras, MD [facility]

## 2018-02-15 ENCOUNTER — Ambulatory Visit (INDEPENDENT_AMBULATORY_CARE_PROVIDER_SITE_OTHER): Payer: Medicaid Other | Admitting: Family

## 2018-02-15 ENCOUNTER — Encounter: Payer: Self-pay | Admitting: Family

## 2018-02-15 VITALS — BP 126/89 | HR 98 | Temp 98.7°F | Ht <= 58 in | Wt 188.4 lb

## 2018-02-15 DIAGNOSIS — J029 Acute pharyngitis, unspecified: Secondary | ICD-10-CM | POA: Diagnosis not present

## 2018-02-15 DIAGNOSIS — R0981 Nasal congestion: Secondary | ICD-10-CM

## 2018-02-15 DIAGNOSIS — J069 Acute upper respiratory infection, unspecified: Secondary | ICD-10-CM

## 2018-02-15 LAB — CULTURE, GROUP A STREP

## 2018-02-15 LAB — RAPID STREP SCREEN (MED CTR MEBANE ONLY): Strep Gp A Ag, IA W/Reflex: NEGATIVE

## 2018-02-15 MED ORDER — FLUTICASONE PROPIONATE 50 MCG/ACT NA SUSP: 2.0000 | Freq: Every day | NASAL | 6 refills | Status: DC

## 2018-02-15 NOTE — Patient Instructions (Signed)
Upper Respiratory Infection, Adult Most upper respiratory infections (URIs) are caused by a virus. A URI affects the nose, throat, and upper air passages. The most common type of URI is often called "the common cold." Follow these instructions at home:  Take medicines only as told by your doctor.  Gargle warm saltwater or take cough drops to comfort your throat as told by your doctor.  Use a warm mist humidifier or inhale steam from a shower to increase air moisture. This may make it easier to breathe.  Drink enough fluid to keep your pee (urine) clear or pale yellow.  Eat soups and other clear broths.  Have a healthy diet.  Rest as needed.  Go back to work when your fever is gone or your doctor says it is okay. ? You may need to stay home longer to avoid giving your URI to others. ? You can also wear a face mask and wash your hands often to prevent spread of the virus.  Use your inhaler more if you have asthma.  Do not use any tobacco products, including cigarettes, chewing tobacco, or electronic cigarettes. If you need help quitting, ask your doctor. Contact a doctor if:  You are getting worse, not better.  Your symptoms are not helped by medicine.  You have chills.  You are getting more short of breath.  You have brown or red mucus.  You have yellow or brown discharge from your nose.  You have pain in your face, especially when you bend forward.  You have a fever.  You have puffy (swollen) neck glands.  You have pain while swallowing.  You have white areas in the back of your throat. Get help right away if:  You have very bad or constant: ? Headache. ? Ear pain. ? Pain in your forehead, behind your eyes, and over your cheekbones (sinus pain). ? Chest pain.  You have long-lasting (chronic) lung disease and any of the following: ? Wheezing. ? Long-lasting cough. ? Coughing up blood. ? A change in your usual mucus.  You have a stiff neck.  You have  changes in your: ? Vision. ? Hearing. ? Thinking. ? Mood. This information is not intended to replace advice given to you by your health care provider. Make sure you discuss any questions you have with your health care provider. Document Released: 01/24/2008 Document Revised: 04/09/2016 Document Reviewed: 11/12/2013 Elsevier Interactive Patient Education  2018 Elsevier Inc.  

## 2018-02-15 NOTE — Progress Notes (Signed)
   Subjective:    Patient ID: Kristin Saunders, female    DOB: 30-May-1991, 27 y.o.   MRN: [id number]  Chief Complaint  Patient presents with  . Sore Throat  . Cough    Sore Throat   This is a new problem. The current episode started 1 to 4 weeks ago. The problem has been gradually worsening. There has been no fever. Associated symptoms include congestion, coughing, ear pain and swollen glands. Pertinent negatives include no headaches or shortness of breath.  Sinusitis  This is a new problem. The current episode started 1 to 4 weeks ago. The problem has been gradually worsening since onset. There has been no fever. The pain is mild. Associated symptoms include congestion, coughing, ear pain, sinus pressure, a sore throat and swollen glands. Pertinent negatives include no headaches, shortness of breath or sneezing. Treatments tried: zyrtec. The treatment provided mild relief.      Review of Systems  HENT: Positive for congestion, ear pain, sinus pressure and sore throat. Negative for sneezing.   Respiratory: Positive for cough. Negative for shortness of breath.   Neurological: Negative for headaches.  All other systems reviewed and are negative.      Objective:   Physical Exam  Constitutional: She is oriented to person, place, and time. She appears well-developed and well-nourished. No distress.  HENT:  Head: Normocephalic and atraumatic.  Right Ear: External ear normal.  Nose: Mucosal edema and rhinorrhea present.  Mouth/Throat: Posterior oropharyngeal erythema present.  Eyes: Pupils are equal, round, and reactive to light.  Neck: Normal range of motion. Neck supple. No thyromegaly present.  Cardiovascular: Normal rate, regular rhythm, normal heart sounds and intact distal pulses.  No murmur heard. Pulmonary/Chest: Effort normal and breath sounds normal. No respiratory distress. She has no wheezes.  Abdominal: Soft. Bowel sounds are normal. She exhibits no distension. There is no  tenderness.  Musculoskeletal: Normal range of motion. She exhibits no edema or tenderness.  Neurological: She is alert and oriented to person, place, and time. She has normal reflexes. No cranial nerve deficit.  Skin: Skin is warm and dry.  Psychiatric: She has a normal mood and affect. Her behavior is normal. Judgment and thought content normal.  Vitals reviewed.     BP 126/89   Pulse 98   Temp 98.7 F (37.1 C) (Oral)   Ht 1' (0.305 m)   Wt 188 lb 6.4 oz (85.5 kg)   HC 64" (162.6 cm)   BMI 919.86 kg/m      Assessment & Plan:  Kristin Saunders was seen today for sore throat and cough.  Diagnoses and all orders for this visit:  Sore throat -     Rapid Strep Screen ([facility] ONLY)  Nasal congestion -     fluticasone (FLONASE) 50 MCG/ACT nasal spray; Place 2 sprays into both nostrils daily.  Viral URI -     fluticasone (FLONASE) 50 MCG/ACT nasal spray; Place 2 sprays into both nostrils daily.   - Take meds as prescribed - Use a cool mist humidifier  -Use saline nose sprays frequently -Force fluids -For any cough or congestion  Use plain Mucinex- regular strength or max strength is fine -For fever or aces or pains- take tylenol or ibuprofen. -Throat lozenges if help -New toothbrush in 3 days   Jannifer Rodneyhristy Cybele Maule, FNP

## 2018-03-19 ENCOUNTER — Ambulatory Visit (INDEPENDENT_AMBULATORY_CARE_PROVIDER_SITE_OTHER): Payer: Medicaid Other | Admitting: Family

## 2018-03-19 ENCOUNTER — Encounter: Payer: Self-pay | Admitting: Family

## 2018-03-19 VITALS — BP 101/76 | HR 81 | Temp 98.6°F | Ht 63.0 in | Wt 192.0 lb

## 2018-03-19 DIAGNOSIS — E669 Obesity, unspecified: Secondary | ICD-10-CM | POA: Diagnosis not present

## 2018-03-19 DIAGNOSIS — F419 Anxiety disorder, unspecified: Secondary | ICD-10-CM | POA: Diagnosis not present

## 2018-03-19 DIAGNOSIS — F331 Major depressive disorder, recurrent, moderate: Secondary | ICD-10-CM | POA: Diagnosis not present

## 2018-03-19 MED ORDER — ESCITALOPRAM OXALATE 10 MG PO TABS: 10.0000 mg | ORAL_TABLET | Freq: Every day | ORAL | 3 refills | Status: DC

## 2018-03-19 NOTE — Patient Instructions (Signed)

## 2018-03-19 NOTE — Progress Notes (Signed)
   Subjective:    Patient ID: Kristin Saunders, female    DOB: October 07, 1990, 27 y.o.   MRN: [id number]  No chief complaint on file.   Depression         This is a new problem.  The current episode started more than 1 month ago.   The onset quality is gradual.   The problem occurs intermittently.  The problem has been waxing and waning since onset.  Associated symptoms include helplessness, hopelessness, irritable, restlessness, decreased interest and sad.     The symptoms are aggravated by family issues.  Past treatments include SSRIs - Selective serotonin reuptake inhibitors.     Review of Systems  Psychiatric/Behavioral: Positive for depression.  All other systems reviewed and are negative.      Objective:   Physical Exam  Constitutional: She is oriented to person, place, and time. She appears well-developed and well-nourished. She is irritable. No distress.  HENT:  Head: Normocephalic and atraumatic.  Right Ear: External ear normal.  Left Ear: External ear normal.  Mouth/Throat: Oropharynx is clear and moist.  Eyes: Pupils are equal, round, and reactive to light.  Neck: Normal range of motion. Neck supple. No thyromegaly present.  Cardiovascular: Normal rate, regular rhythm, normal heart sounds and intact distal pulses.  No murmur heard. Pulmonary/Chest: Effort normal and breath sounds normal. No respiratory distress. She has no wheezes.  Abdominal: Soft. Bowel sounds are normal. She exhibits no distension. There is no tenderness.  Musculoskeletal: Normal range of motion. She exhibits no edema or tenderness.  Neurological: She is alert and oriented to person, place, and time. She has normal reflexes. No cranial nerve deficit.  Skin: Skin is warm and dry.  Psychiatric: She has a normal mood and affect. Her behavior is normal. Judgment and thought content normal.  Vitals reviewed.     BP 101/76   Pulse 81   Temp 98.6 F (37 C) (Oral)   Ht '5\' 3"'$  (1.6 m)   Wt 192 lb (87.1 kg)    BMI 34.01 kg/m      Assessment & Plan:  Kristin Saunders comes in today with chief complaint of No chief complaint on file.   Diagnosis and orders addressed:  1. Moderate episode of recurrent major depressive disorder (HCC) Pt started Lexapro 10 mg  Stress management discussed  RTO in 6 weeks - escitalopram (LEXAPRO) 10 MG tablet; Take 1 tablet (10 mg total) by mouth daily.  Dispense: 90 tablet; Refill: 3 - CMP14+EGFR  2. Anxiety Pt started Lexapro 10 mg  Stress management discussed  RTO in 6 weeks - escitalopram (LEXAPRO) 10 MG tablet; Take 1 tablet (10 mg total) by mouth daily.  Dispense: 90 tablet; Refill: 3 - CMP14+EGFR  3. Obesity (BMI 30.0-34.9) - CMP14+EGFR   Health Maintenance reviewed Diet and exercise encouraged  Follow up plan: 6 weeks    Evelina Dun, FNP

## 2018-05-14 DIAGNOSIS — J0110 Acute frontal sinusitis, unspecified: Secondary | ICD-10-CM | POA: Diagnosis not present

## 2018-07-17 ENCOUNTER — Encounter: Payer: Self-pay | Admitting: Family Medicine

## 2018-07-17 ENCOUNTER — Ambulatory Visit (INDEPENDENT_AMBULATORY_CARE_PROVIDER_SITE_OTHER): Payer: Medicaid Other | Admitting: Family Medicine

## 2018-07-17 VITALS — BP 108/72 | HR 92 | Temp 98.1°F | Ht 63.0 in | Wt 192.8 lb

## 2018-07-17 DIAGNOSIS — J020 Streptococcal pharyngitis: Secondary | ICD-10-CM | POA: Diagnosis not present

## 2018-07-17 DIAGNOSIS — J029 Acute pharyngitis, unspecified: Secondary | ICD-10-CM | POA: Diagnosis not present

## 2018-07-17 LAB — RAPID STREP SCREEN (MED CTR MEBANE ONLY): Strep Gp A Ag, IA W/Reflex: POSITIVE — AB

## 2018-07-17 MED ORDER — AMOXICILLIN 500 MG PO CAPS: 500.0000 mg | ORAL_CAPSULE | Freq: Two times a day (BID) | ORAL | 0 refills | Status: DC

## 2018-07-17 NOTE — Progress Notes (Signed)
BP 108/72   Pulse 92   Temp 98.1 F (36.7 C) (Oral)   Ht 5\' 3"  (1.6 m)   Wt 192 lb 12.8 oz (87.5 kg)   BMI 34.15 kg/m    Subjective:    Patient ID: Kristin Saunders, female    DOB: Jan 15, 1991, 27 y.o.   MRN: [id number]  HPI: Kristin Saunders is a 27 y.o. female presenting on 07/17/2018 for Sore Throat (x 1 week)   HPI Sore throat and chills Patient comes in complaining of sore throat and chills is been going on over the past week.  She feels like it really got worse over the past couple days.  She has been not having fevers but has had chills almost every night and throughout this.  She denies any significant cough or congestion or sinus pressure ear pressure but says is mostly just a lot of pain in her sort throat.  She has been using lozenges which helps some and an allergy medication and Flonase which have not been helping much.  Relevant past medical, surgical, family and social history reviewed and updated as indicated. Interim medical history since our last visit reviewed. Allergies and medications reviewed and updated.  Review of Systems  Constitutional: Positive for chills. Negative for fever.  HENT: Positive for sore throat. Negative for congestion, ear discharge, ear pain, postnasal drip, rhinorrhea, sinus pressure and sneezing.   Eyes: Negative for visual disturbance.  Respiratory: Negative for cough, chest tightness and shortness of breath.   Cardiovascular: Negative for chest pain and leg swelling.  Genitourinary: Negative for difficulty urinating and dysuria.  Musculoskeletal: Negative for back pain and gait problem.  Skin: Negative for rash.  Neurological: Negative for light-headedness and headaches.  Psychiatric/Behavioral: Negative for agitation and behavioral problems.  All other systems reviewed and are negative.   Per HPI unless specifically indicated above   Allergies as of 07/17/2018   No Known Allergies     Medication List        Accurate as of  07/17/18  2:18 PM. Always use your most recent med list.          amoxicillin 500 MG capsule Commonly known as:  AMOXIL Take 1 capsule (500 mg total) by mouth 2 (two) times daily.   cetirizine 10 MG tablet Commonly known as:  ZYRTEC Take 1 tablet (10 mg total) by mouth daily.   escitalopram 10 MG tablet Commonly known as:  LEXAPRO Take 1 tablet (10 mg total) by mouth daily.   fluticasone 50 MCG/ACT nasal spray Commonly known as:  FLONASE Place 2 sprays into both nostrils daily.          Objective:    BP 108/72   Pulse 92   Temp 98.1 F (36.7 C) (Oral)   Ht 5\' 3"  (1.6 m)   Wt 192 lb 12.8 oz (87.5 kg)   BMI 34.15 kg/m   Wt Readings from Last 3 Encounters:  07/17/18 192 lb 12.8 oz (87.5 kg)  03/19/18 192 lb (87.1 kg)  02/15/18 188 lb 6.4 oz (85.5 kg)    Physical Exam  Constitutional: She is oriented to person, place, and time. She appears well-developed and well-nourished. No distress.  HENT:  Right Ear: Tympanic membrane, external ear and ear canal normal.  Left Ear: Tympanic membrane, external ear and ear canal normal.  Nose: Mucosal edema and rhinorrhea present. No epistaxis. Right sinus exhibits no maxillary sinus tenderness and no frontal sinus tenderness. Left sinus exhibits no maxillary sinus tenderness and  no frontal sinus tenderness.  Mouth/Throat: Uvula is midline and mucous membranes are normal. Posterior oropharyngeal edema and posterior oropharyngeal erythema present. No oropharyngeal exudate or tonsillar abscesses.  Eyes: Conjunctivae and EOM are normal.  Cardiovascular: Normal rate, regular rhythm, normal heart sounds and intact distal pulses.  No murmur heard. Pulmonary/Chest: Effort normal and breath sounds normal. No respiratory distress. She has no wheezes.  Musculoskeletal: Normal range of motion. She exhibits no edema or tenderness.  Neurological: She is alert and oriented to person, place, and time. Coordination normal.  Skin: Skin is warm and  dry. No rash noted. She is not diaphoretic.  Psychiatric: She has a normal mood and affect. Her behavior is normal.  Vitals reviewed.   Rapid strep positive    Assessment & Plan:   Problem List Items Addressed This Visit    None    Visit Diagnoses    Strep pharyngitis    -  Primary   Relevant Medications   amoxicillin (AMOXIL) 500 MG capsule   Other Relevant Orders   Rapid Strep Screen ([facility] ONLY)       Follow up plan: Return if symptoms worsen or fail to improve.  Counseling provided for all of the vaccine components Orders Placed This Encounter  Procedures  . Rapid Strep Screen ([facility] ONLY)    Arville CareJoshua Dettinger, MD [facility] 07/17/2018, 2:18 PM

## 2018-08-24 ENCOUNTER — Encounter: Payer: Self-pay | Admitting: Family Medicine

## 2018-08-24 ENCOUNTER — Ambulatory Visit: Payer: Medicaid Other | Admitting: Family Medicine

## 2018-08-24 ENCOUNTER — Encounter: Payer: Self-pay | Admitting: *Deleted

## 2018-08-24 VITALS — BP 117/86 | HR 79 | Temp 97.2°F | Ht 63.0 in | Wt 190.0 lb

## 2018-08-24 DIAGNOSIS — J020 Streptococcal pharyngitis: Secondary | ICD-10-CM | POA: Diagnosis not present

## 2018-08-24 DIAGNOSIS — J029 Acute pharyngitis, unspecified: Secondary | ICD-10-CM | POA: Diagnosis not present

## 2018-08-24 MED ORDER — AMOXICILLIN 500 MG PO CAPS: 500.0000 mg | ORAL_CAPSULE | Freq: Two times a day (BID) | ORAL | 0 refills | Status: DC

## 2018-08-24 NOTE — Progress Notes (Signed)
BP 117/86 (BP Location: Left Arm)   Pulse 79   Temp (!) 97.2 F (36.2 C) (Oral)   Ht 5\' 3"  (1.6 m)   Wt 190 lb (86.2 kg)   BMI 33.66 kg/m    Subjective:    Patient ID: Kristin Saunders, female    DOB: Jan 03, 1991, 28 y.o.   MRN: [id number]  HPI: Kristin Saunders is a 28 y.o. female presenting on 08/24/2018 for Sore Throat (no fever) and Cough (that started about 1 week ago )   HPI Patient is coming in with a little bit of cough but mainly a sore throat and congestion that started a week ago but has worsened in the sore throat department over the past couple days.  She has had strep before and her children have had strep recently so she wanted to get tested for the strep.  She denies any fevers or chills to this point but is trying to get in before it gets really bad.  She has not been using anything over-the-counter except Tylenol to this point.  The Tylenol has helped with the comfort but has not helped with overall illness.  Relevant past medical, surgical, family and social history reviewed and updated as indicated. Interim medical history since our last visit reviewed. Allergies and medications reviewed and updated.  Review of Systems  Constitutional: Negative for chills and fever.  HENT: Positive for congestion, postnasal drip and sore throat. Negative for ear discharge, ear pain, rhinorrhea, sinus pressure and sneezing.   Eyes: Negative for pain, redness and visual disturbance.  Respiratory: Positive for cough. Negative for chest tightness and shortness of breath.   Cardiovascular: Negative for chest pain and leg swelling.  Genitourinary: Negative for difficulty urinating and dysuria.  Musculoskeletal: Negative for back pain and gait problem.  Skin: Negative for rash.  Neurological: Negative for light-headedness and headaches.  Psychiatric/Behavioral: Negative for agitation and behavioral problems.  All other systems reviewed and are negative.   Per HPI unless specifically  indicated above   Allergies as of 08/24/2018   No Known Allergies     Medication List       Accurate as of August 24, 2018  8:22 AM. Always use your most recent med list.        amoxicillin 500 MG capsule Commonly known as:  AMOXIL Take 1 capsule (500 mg total) by mouth 2 (two) times daily.   cetirizine 10 MG tablet Commonly known as:  ZYRTEC Take 1 tablet (10 mg total) by mouth daily.   escitalopram 10 MG tablet Commonly known as:  LEXAPRO Take 1 tablet (10 mg total) by mouth daily.   fluticasone 50 MCG/ACT nasal spray Commonly known as:  FLONASE Place 2 sprays into both nostrils daily.          Objective:    BP 117/86 (BP Location: Left Arm)   Pulse 79   Temp (!) 97.2 F (36.2 C) (Oral)   Ht 5\' 3"  (1.6 m)   Wt 190 lb (86.2 kg)   BMI 33.66 kg/m   Wt Readings from Last 3 Encounters:  08/24/18 190 lb (86.2 kg)  07/17/18 192 lb 12.8 oz (87.5 kg)  03/19/18 192 lb (87.1 kg)    Physical Exam Vitals signs reviewed.  Constitutional:      General: She is not in acute distress.    Appearance: She is well-developed. She is not diaphoretic.  HENT:     Right Ear: Tympanic membrane, ear canal and external ear normal.  Left Ear: Tympanic membrane, ear canal and external ear normal.     Nose: Mucosal edema and rhinorrhea present.     Right Sinus: No maxillary sinus tenderness or frontal sinus tenderness.     Left Sinus: No maxillary sinus tenderness or frontal sinus tenderness.     Mouth/Throat:     Pharynx: Uvula midline. Posterior oropharyngeal erythema present. No oropharyngeal exudate.     Tonsils: No tonsillar abscesses.  Eyes:     Conjunctiva/sclera: Conjunctivae normal.  Cardiovascular:     Rate and Rhythm: Normal rate and regular rhythm.     Heart sounds: Normal heart sounds. No murmur.  Pulmonary:     Effort: Pulmonary effort is normal. No respiratory distress.     Breath sounds: Normal breath sounds. No wheezing.  Musculoskeletal: Normal range of  motion.        General: No tenderness.  Skin:    General: Skin is warm and dry.     Findings: No rash.  Neurological:     Mental Status: She is alert and oriented to person, place, and time.     Coordination: Coordination normal.  Psychiatric:        Behavior: Behavior normal.     Rapid strep: Positive    Assessment & Plan:   Problem List Items Addressed This Visit    None    Visit Diagnoses    Strep pharyngitis    -  Primary   Relevant Medications   amoxicillin (AMOXIL) 500 MG capsule   Other Relevant Orders   Rapid Strep Screen ([facility] ONLY)       Follow up plan: Return if symptoms worsen or fail to improve.  Counseling provided for all of the vaccine components Orders Placed This Encounter  Procedures  . Rapid Strep Screen ([facility] ONLY)    Arville Care, MD [facility] 08/24/2018, 8:22 AM

## 2018-08-26 LAB — RAPID STREP SCREEN (MED CTR MEBANE ONLY): Strep Gp A Ag, IA W/Reflex: POSITIVE — AB

## 2018-09-05 DIAGNOSIS — R05 Cough: Secondary | ICD-10-CM | POA: Diagnosis not present

## 2018-09-05 DIAGNOSIS — J020 Streptococcal pharyngitis: Secondary | ICD-10-CM | POA: Diagnosis not present

## 2018-09-05 DIAGNOSIS — N390 Urinary tract infection, site not specified: Secondary | ICD-10-CM | POA: Diagnosis not present

## 2018-09-05 DIAGNOSIS — R319 Hematuria, unspecified: Secondary | ICD-10-CM | POA: Diagnosis not present

## 2018-09-05 DIAGNOSIS — J0100 Acute maxillary sinusitis, unspecified: Secondary | ICD-10-CM | POA: Diagnosis not present

## 2018-09-05 DIAGNOSIS — J209 Acute bronchitis, unspecified: Secondary | ICD-10-CM | POA: Diagnosis not present

## 2018-09-13 ENCOUNTER — Ambulatory Visit: Payer: Medicaid Other | Admitting: Family Medicine

## 2018-09-17 ENCOUNTER — Ambulatory Visit: Payer: Medicaid Other | Admitting: Family

## 2018-09-17 ENCOUNTER — Encounter: Payer: Self-pay | Admitting: Family

## 2018-09-17 VITALS — BP 101/69 | HR 64 | Temp 97.2°F | Ht 63.0 in | Wt 189.2 lb

## 2018-09-17 DIAGNOSIS — J029 Acute pharyngitis, unspecified: Secondary | ICD-10-CM | POA: Diagnosis not present

## 2018-09-17 DIAGNOSIS — J020 Streptococcal pharyngitis: Secondary | ICD-10-CM

## 2018-09-17 DIAGNOSIS — R51 Headache: Secondary | ICD-10-CM

## 2018-09-17 DIAGNOSIS — R519 Headache, unspecified: Secondary | ICD-10-CM

## 2018-09-17 LAB — RAPID STREP SCREEN (MED CTR MEBANE ONLY): Strep Gp A Ag, IA W/Reflex: POSITIVE — AB

## 2018-09-17 MED ORDER — AZITHROMYCIN 250 MG PO TABS: ORAL_TABLET | ORAL | 0 refills | Status: DC

## 2018-09-17 NOTE — Progress Notes (Signed)
Subjective:    Patient ID: Judithann Sheen, female    DOB: Apr 29, 1991, 28 y.o.   MRN: [id number]  Chief Complaint  Patient presents with  . pain in neck and head    had three nose bleeds   PT presents to the office today with complaints of a headache and sinus pressure. She has been treated on 07/17/18, 08/24/18, 09/05/18 for strep throat. She completed her Amoxicillin 500 mg BID, but states she is still taking taking the Augmentin.  Headache   This is a new problem. The current episode started 1 to 4 weeks ago. The pain is located in the frontal region. The pain is at a severity of 8/10. Associated symptoms include coughing, drainage, rhinorrhea, sinus pressure and a sore throat. Pertinent negatives include no ear pain, muscle aches, nausea, phonophobia or photophobia. Exacerbated by: bending over. Treatments tried: Augmentin for strep throat. The treatment provided mild relief.      Review of Systems  HENT: Positive for rhinorrhea, sinus pressure and sore throat. Negative for ear pain.   Eyes: Negative for photophobia.  Respiratory: Positive for cough.   Gastrointestinal: Negative for nausea.  Neurological: Positive for headaches.       Objective:   Physical Exam Vitals signs reviewed.  Constitutional:      General: She is not in acute distress.    Appearance: She is well-developed.  HENT:     Head: Normocephalic and atraumatic.     Right Ear: Tympanic membrane is erythematous (mildly).     Nose:     Right Sinus: Maxillary sinus tenderness present.     Left Sinus: Maxillary sinus tenderness present.  Eyes:     Pupils: Pupils are equal, round, and reactive to light.  Neck:     Musculoskeletal: Normal range of motion and neck supple.     Thyroid: No thyromegaly.  Cardiovascular:     Rate and Rhythm: Normal rate and regular rhythm.     Heart sounds: Normal heart sounds. No murmur.  Pulmonary:     Effort: Pulmonary effort is normal. No respiratory distress.     Breath  sounds: Normal breath sounds. No wheezing.  Abdominal:     General: Bowel sounds are normal. There is no distension.     Palpations: Abdomen is soft.     Tenderness: There is no abdominal tenderness.  Musculoskeletal: Normal range of motion.        General: No tenderness.  Skin:    General: Skin is warm and dry.  Neurological:     Mental Status: She is alert and oriented to person, place, and time.     Cranial Nerves: No cranial nerve deficit.     Deep Tendon Reflexes: Reflexes are normal and symmetric.  Psychiatric:        Behavior: Behavior normal.        Thought Content: Thought content normal.        Judgment: Judgment normal.       BP 101/69   Pulse 64   Temp (!) 97.2 F (36.2 C) (Oral)   Ht 5\' 3"  (1.6 m)   Wt 189 lb 3.2 oz (85.8 kg)   BMI 33.52 kg/m      Assessment & Plan:  Caylani Weatherwax comes in today with chief complaint of pain in neck and head (had three nose bleeds)   Diagnosis and orders addressed:  1. Nonintractable headache, unspecified chronicity pattern, unspecified headache type - Rapid Strep Screen ([facility] ONLY)  2. Sore  throat - Rapid Strep Screen ([facility] ONLY)  3. Strep throat Recurrent strep, long discussion on the importance of washing all cups and getting new toothbrush If this occurs again may need referral - Take meds as prescribed - Use a cool mist humidifier  -Use saline nose sprays frequently -Force fluids -For any cough or congestion  Use plain Mucinex- regular strength or max strength is fine -For fever or aces or pains- take tylenol or ibuprofen. -Throat lozenges if help -RTO if symptoms worsens or do not improve  - azithromycin (ZITHROMAX) 250 MG tablet; Take 500 mg once, then 250 mg for four days  Dispense: 6 tablet; Refill: 0  [name], FNP

## 2018-09-17 NOTE — Patient Instructions (Signed)
Strep Throat    Strep throat is a bacterial infection of the throat. Your health care provider may call the infection tonsillitis or pharyngitis, depending on whether there is swelling in the tonsils or at the back of the throat. Strep throat is most common during the cold months of the year in children who are 5-27 years of age, but it can happen during any season in people of any age. This infection is spread from person to person (contagious) through coughing, sneezing, or close contact.  What are the causes?  Strep throat is caused by the bacteria called Streptococcus pyogenes.  What increases the risk?  This condition is more likely to develop in:  · People who spend time in crowded places where the infection can spread easily.  · People who have close contact with someone who has strep throat.  What are the signs or symptoms?  Symptoms of this condition include:  · Fever or chills.  · Redness, swelling, or pain in the tonsils or throat.  · Pain or difficulty when swallowing.  · White or yellow spots on the tonsils or throat.  · Swollen, tender glands in the neck or under the jaw.  · Red rash all over the body (rare).  How is this diagnosed?  This condition is diagnosed by performing a rapid strep test or by taking a swab of your throat (throat culture test). Results from a rapid strep test are usually ready in a few minutes, but throat culture test results are available after one or two days.  How is this treated?  This condition is treated with antibiotic medicine.  Follow these instructions at home:  Medicines  · Take over-the-counter and prescription medicines only as told by your health care provider.  · Take your antibiotic as told by your health care provider. Do not stop taking the antibiotic even if you start to feel better.  · Have family members who also have a sore throat or fever tested for strep throat. They may need antibiotics if they have the strep infection.  Eating and drinking  · Do not  share food, drinking cups, or personal items that could cause the infection to spread to other people.  · If swallowing is difficult, try eating soft foods until your sore throat feels better.  · Drink enough fluid to keep your urine clear or pale yellow.  General instructions  · Gargle with a salt-water mixture 3-4 times per day or as needed. To make a salt-water mixture, completely dissolve ½-1 tsp of salt in 1 cup of warm water.  · Make sure that all household members wash their hands well.  · Get plenty of rest.  · Stay home from school or work until you have been taking antibiotics for 24 hours.  · Keep all follow-up visits as told by your health care provider. This is important.  Contact a health care provider if:  · The glands in your neck continue to get bigger.  · You develop a rash, cough, or earache.  · You cough up a thick liquid that is green, yellow-brown, or bloody.  · You have pain or discomfort that does not get better with medicine.  · Your problems seem to be getting worse rather than better.  · You have a fever.  Get help right away if:  · You have new symptoms, such as vomiting, severe headache, stiff or painful neck, chest pain, or shortness of breath.  · You have severe throat   pain, drooling, or changes in your voice.  · You have swelling of the neck, or the skin on the neck becomes red and tender.  · You have signs of dehydration, such as fatigue, dry mouth, and decreased urination.  · You become increasingly sleepy, or you cannot wake up completely.  · Your joints become red or painful.  This information is not intended to replace advice given to you by your health care provider. Make sure you discuss any questions you have with your health care provider.  Document Released: 08/04/2000 Document Revised: 04/05/2016 Document Reviewed: 11/30/2014  Elsevier Interactive Patient Education © 2019 [employer]

## 2018-09-18 ENCOUNTER — Encounter: Payer: Self-pay | Admitting: Family

## 2018-10-08 DIAGNOSIS — R070 Pain in throat: Secondary | ICD-10-CM | POA: Diagnosis not present

## 2018-10-08 DIAGNOSIS — R05 Cough: Secondary | ICD-10-CM | POA: Diagnosis not present

## 2018-10-08 DIAGNOSIS — J209 Acute bronchitis, unspecified: Secondary | ICD-10-CM | POA: Diagnosis not present

## 2018-10-08 DIAGNOSIS — R6889 Other general symptoms and signs: Secondary | ICD-10-CM | POA: Diagnosis not present

## 2018-12-12 DIAGNOSIS — Z20828 Contact with and (suspected) exposure to other viral communicable diseases: Secondary | ICD-10-CM | POA: Diagnosis not present

## 2018-12-12 DIAGNOSIS — R05 Cough: Secondary | ICD-10-CM | POA: Diagnosis not present

## 2018-12-12 DIAGNOSIS — R112 Nausea with vomiting, unspecified: Secondary | ICD-10-CM | POA: Diagnosis not present

## 2018-12-12 DIAGNOSIS — B349 Viral infection, unspecified: Secondary | ICD-10-CM | POA: Diagnosis not present

## 2018-12-12 DIAGNOSIS — I10 Essential (primary) hypertension: Secondary | ICD-10-CM | POA: Diagnosis not present

## 2018-12-12 DIAGNOSIS — R531 Weakness: Secondary | ICD-10-CM | POA: Diagnosis not present

## 2018-12-12 DIAGNOSIS — Z79899 Other long term (current) drug therapy: Secondary | ICD-10-CM | POA: Diagnosis not present

## 2018-12-16 DIAGNOSIS — R0602 Shortness of breath: Secondary | ICD-10-CM | POA: Diagnosis not present

## 2018-12-16 DIAGNOSIS — Z20828 Contact with and (suspected) exposure to other viral communicable diseases: Secondary | ICD-10-CM | POA: Diagnosis not present

## 2018-12-16 DIAGNOSIS — J302 Other seasonal allergic rhinitis: Secondary | ICD-10-CM | POA: Diagnosis not present

## 2018-12-16 DIAGNOSIS — R05 Cough: Secondary | ICD-10-CM | POA: Diagnosis not present

## 2018-12-16 DIAGNOSIS — R51 Headache: Secondary | ICD-10-CM | POA: Diagnosis not present

## 2018-12-17 DIAGNOSIS — S0990XA Unspecified injury of head, initial encounter: Secondary | ICD-10-CM | POA: Diagnosis not present

## 2018-12-17 DIAGNOSIS — H538 Other visual disturbances: Secondary | ICD-10-CM | POA: Diagnosis not present

## 2018-12-17 DIAGNOSIS — R51 Headache: Secondary | ICD-10-CM | POA: Diagnosis not present

## 2018-12-17 DIAGNOSIS — S0101XA Laceration without foreign body of scalp, initial encounter: Secondary | ICD-10-CM | POA: Diagnosis not present

## 2018-12-27 DIAGNOSIS — Z4802 Encounter for removal of sutures: Secondary | ICD-10-CM | POA: Diagnosis not present

## 2019-02-03 DIAGNOSIS — M5431 Sciatica, right side: Secondary | ICD-10-CM | POA: Diagnosis not present

## 2019-02-03 DIAGNOSIS — M549 Dorsalgia, unspecified: Secondary | ICD-10-CM | POA: Diagnosis not present

## 2019-02-06 ENCOUNTER — Ambulatory Visit: Payer: Medicaid Other | Admitting: Family

## 2019-02-17 ENCOUNTER — Other Ambulatory Visit: Payer: Self-pay

## 2019-02-17 ENCOUNTER — Encounter: Payer: Self-pay | Admitting: Family Medicine

## 2019-02-17 ENCOUNTER — Ambulatory Visit: Payer: Medicaid Other | Admitting: Family Medicine

## 2019-02-17 VITALS — BP 99/64 | HR 87 | Temp 98.4°F | Ht 63.0 in | Wt 189.0 lb

## 2019-02-17 DIAGNOSIS — M5442 Lumbago with sciatica, left side: Secondary | ICD-10-CM | POA: Diagnosis not present

## 2019-02-17 MED ORDER — PREDNISONE 20 MG PO TABS: ORAL_TABLET | ORAL | 0 refills | Status: DC

## 2019-02-17 MED ORDER — KETOROLAC TROMETHAMINE 30 MG/ML IJ SOLN
30.0000 mg | Freq: Once | INTRAMUSCULAR | Status: AC
  Administered 2019-02-17: 30 mg via INTRAMUSCULAR

## 2019-02-17 MED ORDER — NAPROXEN 500 MG PO TABS: 500.0000 mg | ORAL_TABLET | Freq: Two times a day (BID) | ORAL | 1 refills | Status: DC

## 2019-02-17 NOTE — Patient Instructions (Signed)

## 2019-02-17 NOTE — Progress Notes (Signed)
Subjective: CC: back pain PCP: Junie SpencerHawks, Christy A, FNP HPI: Patient is a 28 y.o. female presenting to clinic today for back pain. Concerns today include:  1. Back Pain Patient reports that pain began 4 weeks ago.  No previous h/o back pain.  Pain is a 7/10.  It does radiate to the left leg.  Activity worsens pain.  Nothing improves pain.  Patient has been taking Advil for pain with no relief.  Patient denies trauma or injury.  Denies dysuria, hematuria, fevers, chills, nausea, vomiting, abdominal pain, renal stones.   Denies saddle anesthesia, urinary retention/incontinence, bowel incontinence, weakness, falls, or injury.  Does complain of left leg numbness No h/o back surgeries.   Current Outpatient Medications:  .  cetirizine (ZYRTEC) 10 MG tablet, Take 1 tablet (10 mg total) by mouth daily., Disp: 30 tablet, Rfl: 11 .  escitalopram (LEXAPRO) 10 MG tablet, Take 1 tablet (10 mg total) by mouth daily., Disp: 90 tablet, Rfl: 3 .  fluticasone (FLONASE) 50 MCG/ACT nasal spray, Place 2 sprays into both nostrils daily., Disp: 16 g, Rfl: 6 .  ipratropium (ATROVENT) 0.06 % nasal spray, 2 sprays by Each Nare route Three (3) times a day., Disp: , Rfl:  .  naproxen (NAPROSYN) 500 MG tablet, Take 1 tablet (500 mg total) by mouth 2 (two) times daily with a meal., Disp: 60 tablet, Rfl: 1 .  predniSONE (DELTASONE) 20 MG tablet, 2 po at sametime daily for 5 days, Disp: 10 tablet, Rfl: 0  Current Facility-Administered Medications:  .  ketorolac (TORADOL) 30 MG/ML injection 30 mg, 30 mg, Intramuscular, Once, Ary Rudnick, Doralee AlbinoLinda M, FNP No Known Allergies  Past Medical History:  Diagnosis Date  . Medical history non-contributory    Social History   Socioeconomic History  . Marital status: Single    Spouse name: Not on file  . Number of children: Not on file  . Years of education: Not on file  . Highest education level: Not on file  Occupational History  . Not on file  Social Needs  . Financial  resource strain: Not on file  . Food insecurity    Worry: Not on file    Inability: Not on file  . Transportation needs    Medical: Not on file    Non-medical: Not on file  Tobacco Use  . Smoking status: Never Smoker  . Smokeless tobacco: Never Used  Substance and Sexual Activity  . Alcohol use: Yes    Comment: rare  . Drug use: No  . Sexual activity: Not on file  Lifestyle  . Physical activity    Days per week: Not on file    Minutes per session: Not on file  . Stress: Not on file  Relationships  . Social Musicianconnections    Talks on phone: Not on file    Gets together: Not on file    Attends religious service: Not on file    Active member of club or organization: Not on file    Attends meetings of clubs or organizations: Not on file    Relationship status: Not on file  . Intimate partner violence    Fear of current or ex partner: Not on file    Emotionally abused: Not on file    Physically abused: Not on file    Forced sexual activity: Not on file  Other Topics Concern  . Not on file  Social History Narrative  . Not on file   Past Surgical History:  Procedure  Laterality Date  . NO PAST SURGERIES    . TUBAL LIGATION      Review of Systems  Constitutional: Negative for chills, diaphoresis, fever, malaise/fatigue and weight loss.  Respiratory: Negative for shortness of breath.   Cardiovascular: Negative for chest pain.  Musculoskeletal: Positive for back pain and myalgias. Negative for joint pain and neck pain.  Neurological: Negative for focal weakness and weakness.  All other systems reviewed and are negative.    Objective: Office vital signs reviewed. BP 99/64   Pulse 87   Temp 98.4 F (36.9 C) (Oral)   Ht 5\' 3"  (1.6 m)   Wt 189 lb (85.7 kg)   LMP 02/03/2019   BMI 33.48 kg/m   Physical Examination:  General: Awake, alert, oriented nourished, NAD Cardio: Regular rate and rhythm, S1S2 heard, no murmurs appreciated Pulm: Clear to auscultation  bilaterally, no wheezes, rhonchi or rales Extremities: Warm, well-perfused. No edema, cyanosis or clubbing; +2 pulses bilaterally MSK: Antalgic gait and station  Lumbar Spine:  Pain with AROM, midline tenderness to palpation, bilateral paraspinal tenderness to palpation.  No palpable bony deformities,  Positive straight leg test on left.  Neuro: 5/5 lower extremity strength; lower extremity light touch sensation grossly intact.  Assessment/ Plan: Kristin Saunders was seen today for back pain.  Diagnoses and all orders for this visit:  Acute midline low back pain with left-sided sciatica No red flags or indications for imaging. No injury. Ongoing pain with activity and lifting. Will trial conservative therapy for 4-6 weeks. Pt aware to report any new or worsening symptoms. May need referral to PT if above is not beneficial. Follow up in 4-6 weeks.  -     ketorolac (TORADOL) 30 MG/ML injection 30 mg -     predniSONE (DELTASONE) 20 MG tablet; 2 po at sametime daily for 5 days -     naproxen (NAPROSYN) 500 MG tablet; Take 1 tablet (500 mg total) by mouth 2 (two) times daily with a meal.   Return in about 4 weeks (around 03/17/2019), or if symptoms worsen or fail to improve, for low back pain.  The above assessment and management plan was discussed with the patient. The patient verbalized understanding of and has agreed to the management plan. Patient is aware to call the clinic if symptoms persist or worsen. Patient is aware when to return to the clinic for a follow-up visit. Patient educated on when it is appropriate to go to the emergency department.   Monia Pouch, FNP-C [facility] [address] [place], [place] [address] [phone]

## 2019-03-11 ENCOUNTER — Other Ambulatory Visit: Payer: Self-pay

## 2019-03-11 ENCOUNTER — Ambulatory Visit: Payer: Medicaid Other | Admitting: Family Medicine

## 2019-03-11 ENCOUNTER — Encounter: Payer: Self-pay | Admitting: Family Medicine

## 2019-03-11 ENCOUNTER — Ambulatory Visit (INDEPENDENT_AMBULATORY_CARE_PROVIDER_SITE_OTHER): Payer: Medicaid Other

## 2019-03-11 VITALS — BP 92/62 | HR 62 | Temp 97.8°F | Ht 63.0 in | Wt 192.0 lb

## 2019-03-11 DIAGNOSIS — M545 Low back pain: Secondary | ICD-10-CM | POA: Diagnosis not present

## 2019-03-11 DIAGNOSIS — M5442 Lumbago with sciatica, left side: Secondary | ICD-10-CM

## 2019-03-11 DIAGNOSIS — Z79899 Other long term (current) drug therapy: Secondary | ICD-10-CM | POA: Diagnosis not present

## 2019-03-11 DIAGNOSIS — G8929 Other chronic pain: Secondary | ICD-10-CM | POA: Diagnosis not present

## 2019-03-11 DIAGNOSIS — M4317 Spondylolisthesis, lumbosacral region: Secondary | ICD-10-CM

## 2019-03-11 DIAGNOSIS — M5441 Lumbago with sciatica, right side: Secondary | ICD-10-CM

## 2019-03-11 DIAGNOSIS — M4300 Spondylolysis, site unspecified: Secondary | ICD-10-CM | POA: Insufficient documentation

## 2019-03-11 LAB — BMP8+EGFR
BUN/Creatinine Ratio: 23 (ref 9–23)
BUN: 17 mg/dL (ref 6–20)
CO2: 21 mmol/L (ref 20–29)
Calcium: 8.9 mg/dL (ref 8.7–10.2)
Chloride: 104 mmol/L (ref 96–106)
Creatinine, Ser: 0.75 mg/dL (ref 0.57–1.00)
GFR calc Af Amer: 127 mL/min/{1.73_m2} (ref >59)
GFR calc non Af Amer: 110 mL/min/{1.73_m2} (ref >59)
Glucose: 85 mg/dL (ref 65–99)
Potassium: 4.4 mmol/L (ref 3.5–5.2)
Sodium: 140 mmol/L (ref 134–144)

## 2019-03-11 IMAGING — DX LUMBAR SPINE - 2-3 VIEW
2 series · 2 of 2 positions shown · non-contrast
Comparison: Abdomen series dated [DATE].

CLINICAL DATA: Low back pain for the past month. No known injury.

EXAM:
LUMBAR SPINE - 2-3 VIEW

[l-spine ap]
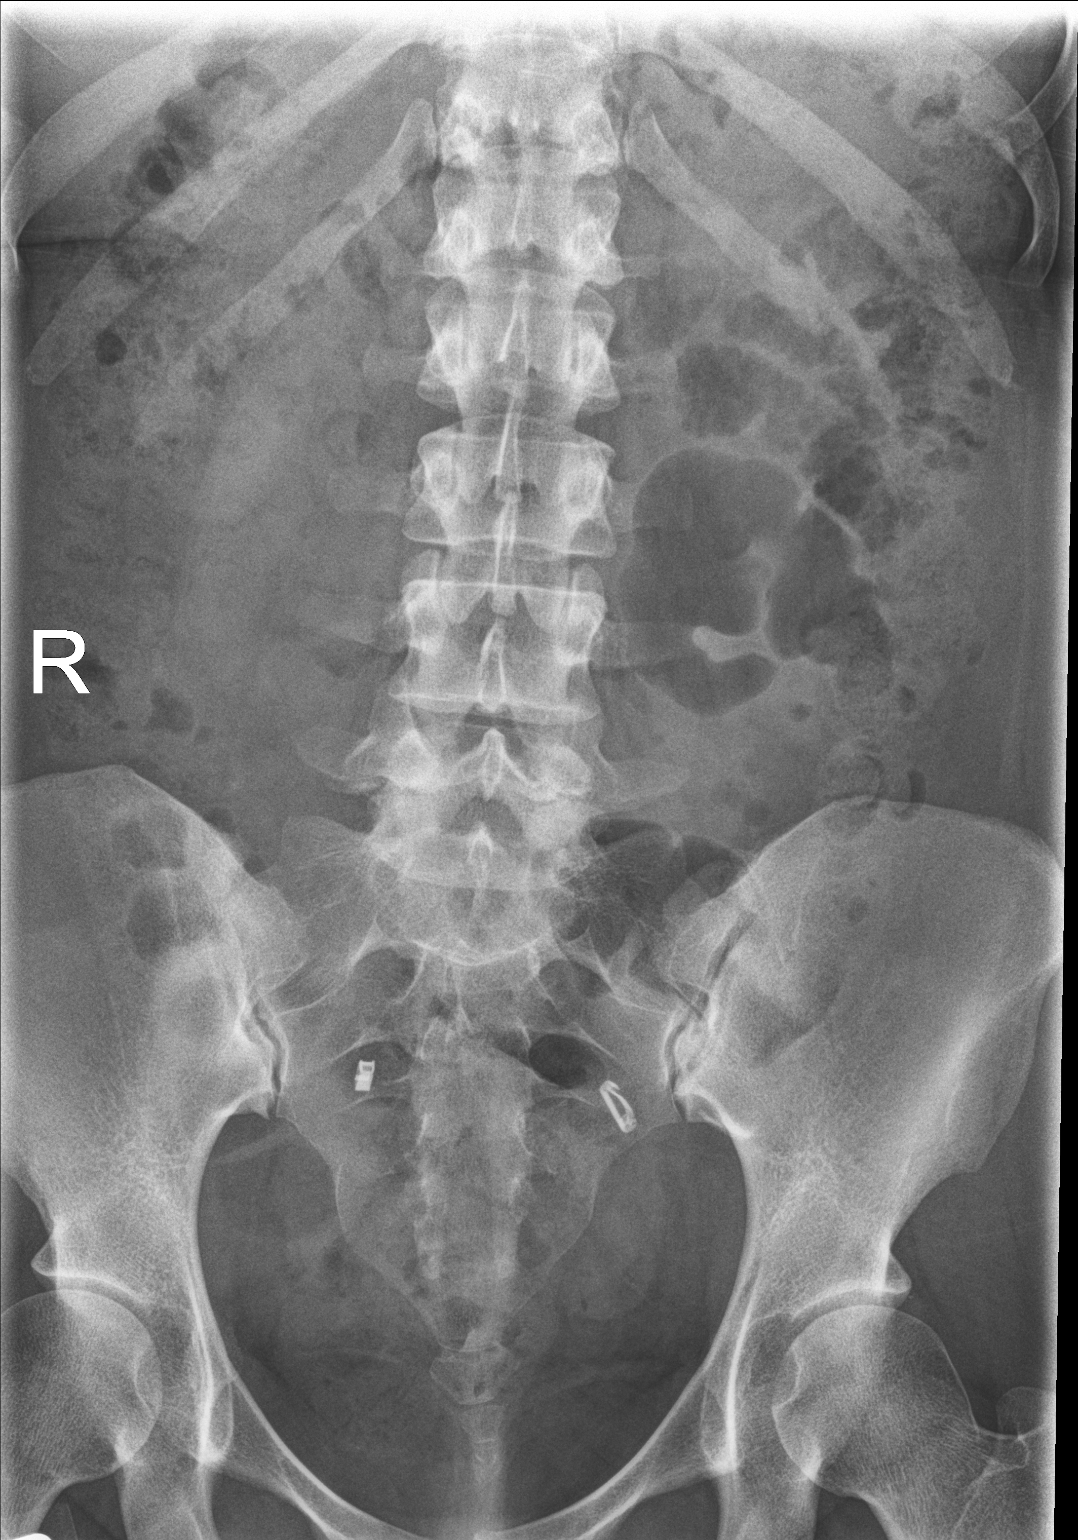

[l-spine lat]
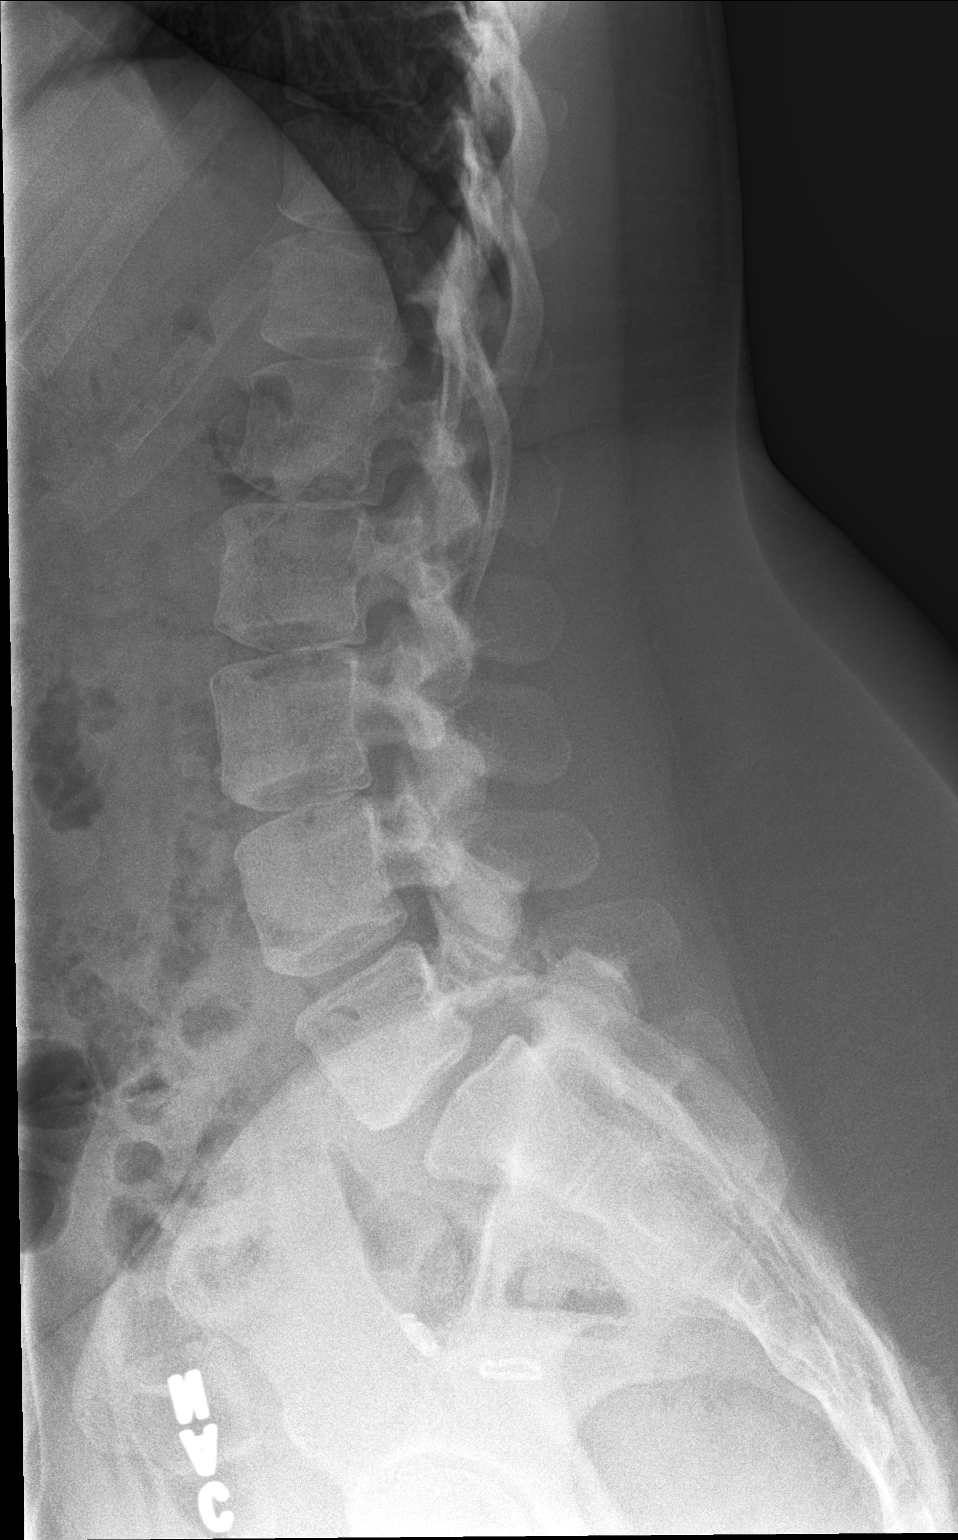

[2 of 2 positions shown; findings below may reference images not displayed]

FINDINGS: Five non-rib-bearing lumbar vertebrae. Minimal levoconvex lumbar
scoliosis without significant change. Bilateral L5 pars
interarticularis defects with associated grade 1 anterolisthesis at
the L5-S1 level. Mild anterior spur formation at the T11-12 level.
Interval bilateral tubal ligation clips.
IMPRESSION: Bilateral L5 spondylolysis with associated grade 1 spondylolisthesis
at the L5-S1 level.

## 2019-03-11 MED ORDER — NAPROXEN 500 MG PO TABS: 500.0000 mg | ORAL_TABLET | Freq: Two times a day (BID) | ORAL | 1 refills | Status: DC

## 2019-03-11 MED ORDER — PREDNISONE 20 MG PO TABS: ORAL_TABLET | ORAL | 0 refills | Status: DC

## 2019-03-11 NOTE — Patient Instructions (Addendum)

## 2019-03-11 NOTE — Progress Notes (Signed)
Subjective:  Patient ID: Kristin Saunders, female    DOB: 11/30/1990, 28 y.o.   MRN: [id number]  Patient Care Team: Sharion Balloon, FNP as PCP - General (Nurse Practitioner)   Chief Complaint:  Back Pain (3 week - rck)   HPI: Kristin Saunders is a 28 y.o. female presenting on 03/11/2019 for Back Pain (3 week - rck)   Pt presents today for follow up of lower back pain. Pt states she only took the naproxen for a few days. States she did not see a difference so she stopped taking them. No new injuries. Is now having bilateral lower extremity tingling. No saddle anesthesia.   Back Pain This is a recurrent problem. The current episode started more than 1 month ago. The problem occurs daily. The problem has been waxing and waning since onset. The pain is present in the lumbar spine. The quality of the pain is described as burning, aching and shooting. The pain radiates to the left thigh and right thigh. The pain is at a severity of 5/10. The pain is moderate. The pain is worse during the day. The symptoms are aggravated by bending, position and twisting. Associated symptoms include paresthesias and tingling. Pertinent negatives include no abdominal pain, bladder incontinence, bowel incontinence, chest pain, dysuria, fever, headaches, leg pain, numbness, paresis, pelvic pain, perianal numbness, weakness or weight loss. She has tried NSAIDs (steroids) for the symptoms. The treatment provided mild relief.    Relevant past medical, surgical, family, and social history reviewed and updated as indicated.  Allergies and medications reviewed and updated. Date reviewed: Chart in Epic.   Past Medical History:  Diagnosis Date  . Medical history non-contributory     Past Surgical History:  Procedure Laterality Date  . NO PAST SURGERIES    . TUBAL LIGATION      Social History   Socioeconomic History  . Marital status: Single    Spouse name: Not on file  . Number of children: Not on file  .  Years of education: Not on file  . Highest education level: Not on file  Occupational History  . Not on file  Social Needs  . Financial resource strain: Not on file  . Food insecurity    Worry: Not on file    Inability: Not on file  . Transportation needs    Medical: Not on file    Non-medical: Not on file  Tobacco Use  . Smoking status: Never Smoker  . Smokeless tobacco: Never Used  Substance and Sexual Activity  . Alcohol use: Yes    Comment: rare  . Drug use: No  . Sexual activity: Not on file  Lifestyle  . Physical activity    Days per week: Not on file    Minutes per session: Not on file  . Stress: Not on file  Relationships  . Social Herbalist on phone: Not on file    Gets together: Not on file    Attends religious service: Not on file    Active member of club or organization: Not on file    Attends meetings of clubs or organizations: Not on file    Relationship status: Not on file  . Intimate partner violence    Fear of current or ex partner: Not on file    Emotionally abused: Not on file    Physically abused: Not on file    Forced sexual activity: Not on file  Other Topics Concern  .  Not on file  Social History Narrative  . Not on file    Outpatient Encounter Medications as of 03/11/2019  Medication Sig  . cetirizine (ZYRTEC) 10 MG tablet Take 1 tablet (10 mg total) by mouth daily.  Marland Kitchen escitalopram (LEXAPRO) 10 MG tablet Take 1 tablet (10 mg total) by mouth daily.  . fluticasone (FLONASE) 50 MCG/ACT nasal spray Place 2 sprays into both nostrils daily.  . [DISCONTINUED] naproxen (NAPROSYN) 500 MG tablet Take 1 tablet (500 mg total) by mouth 2 (two) times daily with a meal.  . naproxen (NAPROSYN) 500 MG tablet Take 1 tablet (500 mg total) by mouth 2 (two) times daily with a meal.  . predniSONE (DELTASONE) 20 MG tablet 2 po at sametime daily for 5 days  . [DISCONTINUED] ipratropium (ATROVENT) 0.06 % nasal spray 2 sprays by Each Nare route Three (3)  times a day.  . [DISCONTINUED] predniSONE (DELTASONE) 20 MG tablet 2 po at sametime daily for 5 days   No facility-administered encounter medications on file as of 03/11/2019.     No Known Allergies  Review of Systems  Constitutional: Negative for activity change, appetite change, chills, diaphoresis, fatigue, fever, unexpected weight change and weight loss.  Respiratory: Negative for cough and shortness of breath.   Cardiovascular: Negative for chest pain, palpitations and leg swelling.  Gastrointestinal: Negative for abdominal pain and bowel incontinence.  Genitourinary: Negative for bladder incontinence, difficulty urinating, dysuria, pelvic pain and urgency.  Musculoskeletal: Positive for back pain. Negative for arthralgias, gait problem, joint swelling, myalgias, neck pain and neck stiffness.  Neurological: Positive for tingling and paresthesias. Negative for dizziness, tremors, seizures, syncope, facial asymmetry, speech difficulty, weakness, light-headedness, numbness and headaches.  Psychiatric/Behavioral: Negative for confusion.  All other systems reviewed and are negative.       Objective:  BP 92/62   Pulse 62   Temp 97.8 F (36.6 C)   Ht '5\' 3"'  (1.6 m)   Wt 192 lb (87.1 kg)   LMP 03/11/2019 (Exact Date)   BMI 34.01 kg/m    Wt Readings from Last 3 Encounters:  03/11/19 192 lb (87.1 kg)  02/17/19 189 lb (85.7 kg)  09/17/18 189 lb 3.2 oz (85.8 kg)    Physical Exam Vitals signs and nursing note reviewed.  Constitutional:      General: She is not in acute distress.    Appearance: Normal appearance. She is well-developed and well-groomed. She is not ill-appearing, toxic-appearing or diaphoretic.  HENT:     Head: Normocephalic and atraumatic.     Jaw: There is normal jaw occlusion.     Right Ear: Hearing normal.     Left Ear: Hearing normal.     Nose: Nose normal.     Mouth/Throat:     Lips: Pink.     Mouth: Mucous membranes are moist.     Pharynx: Oropharynx is  clear. Uvula midline.  Eyes:     General: Lids are normal.     Extraocular Movements: Extraocular movements intact.     Conjunctiva/sclera: Conjunctivae normal.     Pupils: Pupils are equal, round, and reactive to light.  Neck:     Musculoskeletal: Normal range of motion and neck supple.     Thyroid: No thyroid mass, thyromegaly or thyroid tenderness.     Vascular: No carotid bruit or JVD.     Trachea: Trachea and phonation normal.  Cardiovascular:     Rate and Rhythm: Normal rate and regular rhythm.     Chest Wall:  PMI is not displaced.     Pulses: Normal pulses.     Heart sounds: Normal heart sounds. No murmur. No friction rub. No gallop.   Pulmonary:     Effort: Pulmonary effort is normal. No respiratory distress.     Breath sounds: Normal breath sounds. No wheezing.  Abdominal:     General: Bowel sounds are normal. There is no distension or abdominal bruit.     Palpations: Abdomen is soft. There is no hepatomegaly or splenomegaly.     Tenderness: There is no abdominal tenderness. There is no right CVA tenderness or left CVA tenderness.     Hernia: No hernia is present.  Musculoskeletal: Normal range of motion.     Right shoulder: Normal.     Left shoulder: Normal.     Right hip: Normal.     Left hip: Normal.     Cervical back: Normal.     Thoracic back: Normal.     Lumbar back: She exhibits tenderness and pain. She exhibits normal range of motion, no bony tenderness, no swelling, no edema, no deformity, no laceration, no spasm and normal pulse.       Back:     Right upper leg: Normal.     Left upper leg: Normal.     Right lower leg: No edema.     Left lower leg: No edema.     Comments: Positive bilateral SLR test at 35 degrees  Lymphadenopathy:     Cervical: No cervical adenopathy.  Skin:    General: Skin is warm and dry.     Capillary Refill: Capillary refill takes less than 2 seconds.     Coloration: Skin is not cyanotic, jaundiced or pale.     Findings: No rash.   Neurological:     General: No focal deficit present.     Mental Status: She is alert and oriented to person, place, and time.     Cranial Nerves: Cranial nerves are intact.     Sensory: Sensation is intact.     Motor: Motor function is intact.     Coordination: Coordination is intact.     Gait: Gait is intact.     Deep Tendon Reflexes: Reflexes are normal and symmetric.  Psychiatric:        Attention and Perception: Attention and perception normal.        Mood and Affect: Mood and affect normal.        Speech: Speech normal.        Behavior: Behavior normal. Behavior is cooperative.        Thought Content: Thought content normal.        Cognition and Memory: Cognition and memory normal.        Judgment: Judgment normal.     Results for orders placed or performed in visit on 09/17/18  Rapid Strep Screen ([facility] ONLY)   Specimen: Other   OTHER  Result Value Ref Range   Strep Gp A Ag, IA W/Reflex Positive (A) Negative     X-Ray: Lumbar: No acute findings. Preliminary x-ray reading by Monia Pouch, FNP-C, [facility].   Pertinent labs & imaging results that were available during my care of the patient were reviewed by me and considered in my medical decision making.  Assessment & Plan:  Crystle was seen today for back pain.  Diagnoses and all orders for this visit:  Chronic bilateral low back pain with bilateral sciatica Xray in office unremarkable. Will notify pt if radiology  reading differs. No red flags present. Continue Naproxen. Will trial burst of steroids. Referral to PT. Report any new or worsening symptoms. Follow up after 4-6 weeks of PT for reevaluation.  -     DG Lumbar Spine 2-3 Views; Future -     Ambulatory referral to Physical Therapy -     predniSONE (DELTASONE) 20 MG tablet; 2 po at sametime daily for 5 days -     naproxen (NAPROSYN) 500 MG tablet; Take 1 tablet (500 mg total) by mouth 2 (two) times daily with a meal.  High risk medication use NSAID for  LBP. Will check kidney function today.  -     BMP8+EGFR     Continue all other maintenance medications.  Follow up plan: Return in about 6 weeks (around 04/22/2019), or if symptoms worsen or fail to improve, for back pain.  Educational handout given for low back pain  The above assessment and management plan was discussed with the patient. The patient verbalized understanding of and has agreed to the management plan. Patient is aware to call the clinic if symptoms persist or worsen. Patient is aware when to return to the clinic for a follow-up visit. Patient educated on when it is appropriate to go to the emergency department.   Monia Pouch, FNP-C [facility] [phone] 03/11/19

## 2019-03-18 ENCOUNTER — Encounter: Payer: Self-pay | Admitting: Physical Therapy

## 2019-03-18 ENCOUNTER — Other Ambulatory Visit: Payer: Self-pay

## 2019-03-18 ENCOUNTER — Ambulatory Visit: Payer: Medicaid Other | Attending: Family Medicine | Admitting: Physical Therapy

## 2019-03-18 DIAGNOSIS — M6281 Muscle weakness (generalized): Secondary | ICD-10-CM | POA: Diagnosis not present

## 2019-03-18 DIAGNOSIS — G8929 Other chronic pain: Secondary | ICD-10-CM | POA: Diagnosis not present

## 2019-03-18 DIAGNOSIS — M5442 Lumbago with sciatica, left side: Secondary | ICD-10-CM | POA: Diagnosis not present

## 2019-03-18 DIAGNOSIS — M5441 Lumbago with sciatica, right side: Secondary | ICD-10-CM | POA: Insufficient documentation

## 2019-03-18 NOTE — Therapy (Signed)
[facility] [address] [place], [place], [address] Phone: [phone]   Fax:  [phone]  Physical Therapy Evaluation  Patient Details  Name: Kristin SheenJillian Saunders MRN: [id number] Date of Birth: 05/31/1991 Referring Provider (PT): Gilford SilviusLinda Rakes, FNP   Encounter Date: 03/18/2019  PT End of Session - 03/18/19 1347    Visit Number  1    Number of Visits  12    Date for PT Re-Evaluation  04/29/19    Authorization Type  Medicaid    PT Start Time  1300    PT Stop Time  1333    PT Time Calculation (min)  33 min    Activity Tolerance  Patient tolerated treatment well    Behavior During Therapy  [employer] for tasks assessed/performed       Past Medical History:  Diagnosis Date  . Medical history non-contributory     Past Surgical History:  Procedure Laterality Date  . NO PAST SURGERIES    . TUBAL LIGATION      There were no vitals filed for this visit.   Subjective Assessment - 03/18/19 1428    Subjective  COVID-19 screening performed upon arrival.Patient arrives to physical therapy reports of bilateral low back pain and with numbness and tingling that runs down bilateral feet. Patient reports pain began a month ago.  Patient reports difficulties with ADLs, care giving activities, and work activities involving standing and walking. Patient reports her mother assist for ADLs and caregiving activities when necessary. Patient reports pain at worst as 10/10 and pain at best is 2/10. Patient's goals are to decrease pain, improve movement, improve strength, and improve ability to perform home, work and caregiving activities.    Limitations  Walking;House hold activities;Standing    How long can you sit comfortably?  unlimited    How long can you stand comfortably?  15 mins    How long can you walk comfortably?  15 mins    Currently in Pain?  Yes    Pain Score  6     Pain Location  Back    Pain Orientation  Lower    Pain Descriptors / Indicators   Sharp;Sore;Aching    Pain Type  Acute pain    Pain Radiating Towards  bilateral feet    Pain Onset  More than a month ago    Pain Frequency  Constant    Aggravating Factors   standing and walking too long    Pain Relieving Factors  sitting.    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   No    Is the patient reluctant to leave their home because of a fear of falling?   No  Home Environment   Living Environment  Private residence    Living Arrangements  Children;Parent      Prior Function   Level of Independence  Independent      Deep Tendon Reflexes   DTR Assessment Site  Achilles    Achilles DTR  1+      ROM / Strength   AROM / PROM / Strength  AROM;Strength      AROM   Overall AROM   Due to pain    AROM Assessment Site  Lumbar    Lumbar Flexion  finger tips to toes    Lumbar Extension  5 degree    Lumbar - Right Side Bend  19.5" finger tip to floor    Lumbar - Left Side Bend  19.5" finger tip to floor      Strength   Strength Assessment Site  Hip;Knee    Right/Left Hip  Right;Left    Right Hip Flexion  4/5    Right Hip Extension  3+/5    Right Hip ABduction  3+/5    Left Hip Flexion  4/5    Left Hip Extension  3+/5    Left Hip ABduction  3+/5    Right/Left Knee  Right;Left    Right Knee Flexion  4+/5    Right Knee Extension  4+/5    Left Knee Flexion  4+/5    Left Knee Extension  4+/5      Palpation   Palpation comment   Very tender to palpation bilaterally in lumbar paraspinals and bilateral QLs. increased muscle tone to QLs bilaterally.       Special Tests    Special Tests  Lumbar    Lumbar Tests  Slump Test      Slump test   Findings  Positive    Side  Right    Comment  reproduction of neurological symptoms bilaterally      Ambulation/Gait   Gait Pattern  Within Functional Limits                Objective measurements completed on examination: See above findings.              PT Education - 03/18/19 1351    Education Details  draw ins, hip abduction, hip adduction, glute squeeze, scapular retractions    Person(s) Educated  Patient    Methods  Explanation;Demonstration;Handout    Comprehension  Verbalized understanding;Returned demonstration       PT Short Term Goals - 03/18/19 1355      PT SHORT TERM GOAL #1   Title  Patient will be independent with HEP    Baseline  no knowledge of HEP    Time  3    Period  Weeks    Status  New      PT SHORT TERM GOAL #2   Title  Patient will demonstrate proper log rolling bed mobility to protect spine during transitional movements.    Baseline  Supine to long sit transition.    Time  3    Period  Weeks    Status  New      PT SHORT TERM GOAL #3   Title  Patient will report bilateral low back pain at worst as 8/10 with ADLs and caregiving activities.    Baseline  At worst 10/10 pain in bilateral low back    Time  3    Period  Weeks    Status  New  PT Long Term Goals - 03/18/19 1400      PT LONG TERM GOAL #1   Title  Patient will be independent with advanced HEP    Baseline  no knowledge of exercises    Time  6    Period  Weeks    Status  New      PT LONG TERM GOAL #2   Title  Patient will demonstrate 4/5 bilateral hip abduction and extension MMT to improve stablity during functional tasks    Baseline  3+/5 bilaterally in abduction and extension    Time  6    Period  Weeks    Status  New      PT LONG TERM  GOAL #3   Title  Patient will report ability to perform ADLs and care giving activities with low back pain less than or equal to 5/10.    Baseline  at worst 10/10 in low back with activities    Time  6    Period  Weeks    Status  New      PT LONG TERM GOAL #4   Title  Patient will report a centralization of neurological symptoms to low back to decreased bilateral nerve irritation    Baseline  neurological symptoms of numbness and tingling to bilateral feet.    Time  6    Period  Weeks    Status  New             Plan - 03/18/19 1352    Clinical Impression Statement  Patient is a 28 year old female who presents to physical therapy with acute low back pain with neurological symptoms down bilateral LEs to feet. Patient noted with diminished Achilles DTR bilaterally. Patient noted with decreased bilateral hip abduction and extension MMT. Patient very tender to touch to bilateral lumbar paraspinals and bilateral QLs. Patient and PT discussed HEP and educated on importance of performing to maximize PT benefits. Patient would benefit from skilled physical therapy to address deficits and address patient's goals.    Personal Factors and Comorbidities  Age    Examination-Activity Limitations  Bend;Caring for Others;Squat;Sleep;Stand;Stairs;Transfers    Examination-Participation Restrictions  Cleaning    Stability/Clinical Decision Making  Stable/Uncomplicated    Clinical Decision Making  Low    Rehab Potential  Good    PT Frequency  2x / week    PT Duration  6 weeks    PT Treatment/Interventions  ADLs/Self Care Home Management;Iontophoresis 4mg /ml Dexamethasone;Moist Heat;Ultrasound;Traction;Electrical Stimulation;Cryotherapy;Gait training;Stair training;Functional mobility training;Therapeutic activities;Therapeutic exercise;Passive range of motion;Dry needling;Neuromuscular re-education;Manual techniques;Taping;Patient/family education;Spinal Manipulations    PT Next Visit Plan  Nustep, core  strengthening and stabilization, LE strengthening and stretching, modalities PRN for pain relief    PT Home Exercise Plan  see patient education section    Consulted and Agree with Plan of Care  Patient       Patient will benefit from skilled therapeutic intervention in order to improve the following deficits and impairments:  Pain, Postural dysfunction, Difficulty walking, Decreased range of motion, Decreased mobility, Decreased activity tolerance, Decreased strength  Visit Diagnosis: 1. Chronic bilateral low back pain with bilateral sciatica   2. Muscle weakness (generalized)        Problem List Patient Active Problem List   Diagnosis Date Noted  . Spondylolisthesis at L5-S1 level 03/11/2019  . Spondylolysis 03/11/2019  . Chronic bilateral low back pain with bilateral sciatica 03/11/2019  . Obesity (BMI 30.0-34.9) 03/19/2018   Guss BundeKrystle Bambie Pizzolato, PT, DPT 03/18/2019, 3:35  PM  [facility], [place], [address] Phone: [phone]   Fax:  [phone]  Name: Kristin Saunders MRN: [id number] Date of Birth: [birth year]

## 2019-03-25 ENCOUNTER — Ambulatory Visit: Payer: Medicaid Other | Attending: Family Medicine | Admitting: *Deleted

## 2019-04-03 ENCOUNTER — Encounter: Payer: Self-pay | Admitting: Orthopaedic Surgery

## 2019-04-03 ENCOUNTER — Ambulatory Visit (INDEPENDENT_AMBULATORY_CARE_PROVIDER_SITE_OTHER): Payer: Medicaid Other | Admitting: Orthopaedic Surgery

## 2019-04-03 ENCOUNTER — Other Ambulatory Visit: Payer: Self-pay

## 2019-04-03 VITALS — BP 100/70 | HR 73 | Ht 66.0 in | Wt 189.0 lb

## 2019-04-03 DIAGNOSIS — G8929 Other chronic pain: Secondary | ICD-10-CM | POA: Diagnosis not present

## 2019-04-03 DIAGNOSIS — M4317 Spondylolisthesis, lumbosacral region: Secondary | ICD-10-CM

## 2019-04-03 DIAGNOSIS — M5442 Lumbago with sciatica, left side: Secondary | ICD-10-CM

## 2019-04-03 DIAGNOSIS — M5441 Lumbago with sciatica, right side: Secondary | ICD-10-CM

## 2019-04-03 DIAGNOSIS — M4300 Spondylolysis, site unspecified: Secondary | ICD-10-CM

## 2019-04-03 NOTE — Progress Notes (Signed)
Office Visit Note   Patient: Kristin Saunders           Date of Birth: 10-Jun-1991           MRN: [id number] Visit Date: 04/03/2019              Requested by: Baruch Gouty, [place],  [place] [address] PCP: Sharion Balloon, FNP   Assessment & Plan: Visit Diagnoses:  1. Spondylolisthesis at L5-S1 level   2. Spondylolysis   3. Chronic bilateral low back pain with bilateral sciatica     Plan: Patient is failed anti-inflammatories prednisone Dosepak and physical therapy.  She has spondylolisthesis with bilateral spondylolysis and L5 weakness on the right leg.  I recommend proceeding with an MRI scan office follow-up up after MRI scan lumbar for review.  Follow-Up Instructions: No follow-ups on file.   Orders:  Orders Placed This Encounter  Procedures  . MR Lumbar Spine w/o contrast   No orders of the defined types were placed in this encounter.     Procedures: No procedures performed   Clinical Data: No additional findings.   Subjective: Chief Complaint  Patient presents with  . Lower Back - Pain    HPI 28 year old female mother of 2 that works at [employer] had problems with 64-month history of progressive increased back pain leg pain with numbness in her feet.  She took some prednisone and is gotten some relief with this short-term has been taking Naprosyn.  She has been through physical therapy without relief and x-rays demonstrated L5 bilateral spondylolysis with grade 1 spondylolisthesis.  She states she has had problems with working when she is on her feet with numbness in her legs and increasing back pain.  Patient states her mother helps with the children.  Patient denies fever chills no bowel or bladder symptoms no past history of spine fractures.  Patient does not smoke.  Review of Systems positive for childbirth acid reflux.  No other surgeries non-smoker she does not drink.   Objective: Vital Signs: BP 100/70   Pulse 73   Ht 5\' 6"  (1.676  m)   Wt 189 lb (85.7 kg)   LMP 03/11/2019 (Exact Date)   BMI 30.51 kg/m   Physical Exam Constitutional:      Appearance: She is well-developed.  HENT:     Head: Normocephalic.     Right Ear: External ear normal.     Left Ear: External ear normal.  Eyes:     Pupils: Pupils are equal, round, and reactive to light.  Neck:     Thyroid: No thyromegaly.     Trachea: No tracheal deviation.  Cardiovascular:     Rate and Rhythm: Normal rate.  Pulmonary:     Effort: Pulmonary effort is normal.  Abdominal:     Palpations: Abdomen is soft.  Skin:    General: Skin is warm and dry.  Neurological:     Mental Status: She is alert and oriented to person, place, and time.  Psychiatric:        Behavior: Behavior normal.     Ortho Exam bilateral tight hamstrings positive straight leg raise and degrees right and left bilateral sciatic notch tenderness.  Knee and ankle jerk are intact.  She is able to toe walk but when she heel walks she has right foot slap and has some anterior tib weakness on the right with 1 grade EHL weakness on the right.  Left-sided takes normal resistance.  Specialty Comments:  No specialty comments available.  Imaging: No results found.   PMFS History: Patient Active Problem List   Diagnosis Date Noted  . Spondylolisthesis at L5-S1 level 03/11/2019  . Spondylolysis 03/11/2019  . Chronic bilateral low back pain with bilateral sciatica 03/11/2019  . Obesity (BMI 30.0-34.9) 03/19/2018   Past Medical History:  Diagnosis Date  . Medical history non-contributory     Family History  Problem Relation Age of Onset  . Cancer Father        COLON    Past Surgical History:  Procedure Laterality Date  . NO PAST SURGERIES    . TUBAL LIGATION     Social History   Occupational History  . Not on file  Tobacco Use  . Smoking status: Never Smoker  . Smokeless tobacco: Never Used  Substance and Sexual Activity  . Alcohol use: Yes    Comment: rare  . Drug use:  No  . Sexual activity: Not on file

## 2019-04-10 ENCOUNTER — Ambulatory Visit: Payer: Medicaid Other | Admitting: Orthopaedic Surgery

## 2019-04-11 ENCOUNTER — Telehealth: Payer: Self-pay | Admitting: Radiology

## 2019-04-11 NOTE — Telephone Encounter (Signed)
First Tx date for back was at [facility] clinic 6/15, so it has been 6 wks. And medicaid paid for that visit so they should know that. Let pt know and ROV in one month then resubmit.       OR can make copies of each visit previously and resubmit.   Thank you

## 2019-04-11 NOTE — Telephone Encounter (Signed)
Patient's MRI has been denied by insurance due to patient needing six weeks of conservative care.  Please advise on next step.

## 2019-04-11 NOTE — Telephone Encounter (Signed)
Please let me know what you want me to help you with on this?

## 2019-04-15 NOTE — Telephone Encounter (Signed)
Tried to reach patient x 2. Get wireless customer is not available. Will try again.

## 2019-04-16 NOTE — Telephone Encounter (Signed)
Tried to reach patient. Wireless customer not available.

## 2019-04-21 NOTE — Telephone Encounter (Signed)
Unable to reach patient. Will wait for return call. °

## 2019-04-24 DIAGNOSIS — M4306 Spondylolysis, lumbar region: Secondary | ICD-10-CM | POA: Diagnosis not present

## 2019-04-24 DIAGNOSIS — G8929 Other chronic pain: Secondary | ICD-10-CM | POA: Diagnosis not present

## 2019-04-24 DIAGNOSIS — M545 Low back pain: Secondary | ICD-10-CM | POA: Diagnosis not present

## 2019-05-07 ENCOUNTER — Other Ambulatory Visit: Payer: Self-pay

## 2019-05-07 ENCOUNTER — Ambulatory Visit: Payer: Medicaid Other | Attending: Family Medicine | Admitting: Physical Therapy

## 2019-05-07 DIAGNOSIS — G8929 Other chronic pain: Secondary | ICD-10-CM | POA: Diagnosis not present

## 2019-05-07 DIAGNOSIS — M6281 Muscle weakness (generalized): Secondary | ICD-10-CM

## 2019-05-07 DIAGNOSIS — M5442 Lumbago with sciatica, left side: Secondary | ICD-10-CM | POA: Insufficient documentation

## 2019-05-07 DIAGNOSIS — M5441 Lumbago with sciatica, right side: Secondary | ICD-10-CM | POA: Insufficient documentation

## 2019-05-07 NOTE — Therapy (Addendum)
[facility] [place], [place], [address] Phone: [phone]   Fax:  [phone]  Physical Therapy Treatment PHYSICAL THERAPY DISCHARGE SUMMARY  Visits from Start of Care: 2  Current functional level related to goals / functional outcomes: See below   Remaining deficits: See goals   Education / Equipment: HEP Plan: Patient agrees to discharge.  Patient goals were not met. Patient is being discharged due to not returning since the last visit.  ?????    Gabriela Eves, PT, DPT  Patient Details  Name: Kristin Saunders MRN: [id number] Date of Birth: 24-Nov-1990 Referring Provider (PT): Darla Lesches, FNP   Encounter Date: 05/07/2019  PT End of Session - 05/07/19 1230    Visit Number  2    Number of Visits  12    Date for PT Re-Evaluation  04/29/19    Authorization Type  Medicaid    PT Start Time  1202    PT Stop Time  1242    PT Time Calculation (min)  40 min    Activity Tolerance  Patient tolerated treatment well    Behavior During Therapy  [facility] for tasks assessed/performed       Past Medical History:  Diagnosis Date  . Medical history non-contributory     Past Surgical History:  Procedure Laterality Date  . NO PAST SURGERIES    . TUBAL LIGATION      There were no vitals filed for this visit.  Subjective Assessment - 05/07/19 1206    Subjective  COVID-19 screening performed upon arrival. Patient arrived with increased discomfort for unknown reason.    Limitations  Walking;House hold activities;Standing    How long can you sit comfortably?  unlimited    How long can you stand comfortably?  15 mins    How long can you walk comfortably?  15 mins    Currently in Pain?  Yes    Pain Score  7     Pain Location  Back    Pain Orientation  Lower    Pain Descriptors / Indicators  Discomfort;Sore;Sharp    Pain Type  Acute pain    Pain Onset  More than a month ago    Pain Frequency  Constant    Aggravating  Factors   prolong standing or activity    Pain Relieving Factors  at rest                       [facility] Adult PT Treatment/Exercise - 05/07/19 0001      Self-Care   Self-Care  Lifting;ADL's;Posture;Other Self-Care Comments    Other Self-Care Comments   HEP provided       Exercises   Exercises  Lumbar;Knee/Hip      Lumbar Exercises: Supine   Ab Set  20 reps;3 seconds    Glut Set  20 reps;3 seconds    Clam  20 reps   red t-band   Bent Knee Raise  3 seconds   2x20 core activation focus   Bridge  Limitations   unable   Straight Leg Raise  3 seconds;10 reps   only left LE/ RT caused discomfort   Other Supine Lumbar Exercises  ball [id number]      Modalities   Modalities  Moist Heat;Electrical Stimulation      Moist Heat Therapy   Number Minutes Moist Heat  15 Minutes    Moist Heat Location  Lumbar Spine      Electrical Stimulation  Electrical Stimulation Location  low back    Electrical Stimulation Action  IFC    Electrical Stimulation Parameters  80-_0  x80mn    Electrical Stimulation Goals  Pain             PT Education - 05/07/19 1229    Education Details  posture awareness techniques    Person(s) Educated  Patient    Methods  Explanation;Handout    Comprehension  Verbalized understanding;Returned demonstration       PT Short Term Goals - 05/07/19 1235      PT SHORT TERM GOAL #1   Title  Patient will be independent with HEP    Baseline  no knowledge of HEP    Time  3    Period  Weeks    Status  Achieved   understands HEP 05/07/19     PT SHORT TERM GOAL #2   Title  Patient will demonstrate proper log rolling bed mobility to protect spine during transitional movements.    Baseline  Supine to long sit transition.    Time  3    Period  Weeks    Status  Achieved   able to perform with good technique 05/07/19     PT SHORT TERM GOAL #3   Title  Patient will report bilateral low back pain at worst as 8/10 with ADLs and caregiving  activities.    Baseline  At worst 10/10 pain in bilateral low back    Time  3    Period  Weeks    Status  On-going        PT Long Term Goals - 03/18/19 1400      PT LONG TERM GOAL #1   Title  Patient will be independent with advanced HEP    Baseline  no knowledge of exercises    Time  6    Period  Weeks    Status  New      PT LONG TERM GOAL #2   Title  Patient will demonstrate 4/5 bilateral hip abduction and extension MMT to improve stablity during functional tasks    Baseline  3+/5 bilaterally in abduction and extension    Time  6    Period  Weeks    Status  New      PT LONG TERM GOAL #3   Title  Patient will report ability to perform ADLs and care giving activities with low back pain less than or equal to 5/10.    Baseline  at worst 10/10 in low back with activities    Time  6    Period  Weeks    Status  New      PT LONG TERM GOAL #4   Title  Patient will report a centralization of neurological symptoms to low back to decreased bilateral nerve irritation    Baseline  neurological symptoms of numbness and tingling to bilateral feet.    Time  6    Period  Weeks    Status  New            Plan - 05/07/19 1233    Clinical Impression Statement  Patient tolerated treatment fair due to increased pain in low back. Today focused on posture awareness techniques with HEP provided and gentle pain free core activation progression. Patient did well with exercises and logroll technique. Patient had a normal response to modalities upon removal and some decreased pain post treatment. STG #1 and #2 met, other songoing at this time.  Personal Factors and Comorbidities  Age    Examination-Activity Limitations  Bend;Caring for Others;Squat;Sleep;Stand;Stairs;Transfers    Examination-Participation Restrictions  Cleaning    Stability/Clinical Decision Making  Stable/Uncomplicated    Rehab Potential  Good    PT Frequency  2x / week    PT Duration  6 weeks    PT  Treatment/Interventions  ADLs/Self Care Home Management;Iontophoresis 33m/ml Dexamethasone;Moist Heat;Ultrasound;Traction;Electrical Stimulation;Cryotherapy;Gait training;Stair training;Functional mobility training;Therapeutic activities;Therapeutic exercise;Passive range of motion;Dry needling;Neuromuscular re-education;Manual techniques;Taping;Patient/family education;Spinal Manipulations    PT Next Visit Plan  Nustep, core strengthening and stabilization, LE strengthening and stretching, modalities PRN for pain relief    Consulted and Agree with Plan of Care  Patient       Patient will benefit from skilled therapeutic intervention in order to improve the following deficits and impairments:  Pain, Postural dysfunction, Difficulty walking, Decreased range of motion, Decreased mobility, Decreased activity tolerance, Decreased strength  Visit Diagnosis: Muscle weakness (generalized)  Chronic bilateral low back pain with bilateral sciatica     Problem List Patient Active Problem List   Diagnosis Date Noted  . Spondylolisthesis at L5-S1 level 03/11/2019  . Spondylolysis 03/11/2019  . Chronic bilateral low back pain with bilateral sciatica 03/11/2019  . Obesity (BMI 30.0-34.9) 03/19/2018    Janda Cargo P, PTA 05/07/2019, 12:45 PM  [facility] [place] [address]Phone: [phone]  Fax:  [phone] Name: JShaima SardinasMRN: [id number] of Birth: [birth year]

## 2019-05-07 NOTE — Patient Instructions (Signed)
Pelvic Tilt: Posterior - Legs Bent (Supine)  Tighten stomach and flatten back by rolling pelvis down. Hold _10___ seconds. Relax. Repeat _10-30___ times per set. Do __2__ sets per session. Do _2___ sessions per day.   Bent Leg Lift (Hook-Lying)  Tighten stomach and slowly raise right leg _5___ inches from floor. Keep trunk rigid. Hold _3___ seconds. Repeat _10___ times per set. Do ___2-3_ sets per session. Do __2__ sessions per day.  Brushing Teeth    Place one foot on ledge and one hand on counter. Bend other knee slightly to keep back straight.  Copyright  VHI. All rights reserved.  Refrigerator   Squat with knees apart to reach lower shelves and drawers.   Copyright  VHI. All rights reserved.  Laundry Basket   Squat down and hold basket close to stand. Use leg muscles to do the work.   Copyright  VHI. All rights reserved.  Housework - Vacuuming   Hold the vacuum with arm held at side. Step back and forth to move it, keeping head up. Avoid twisting.   Copyright  VHI. All rights reserved.  Housework - Wiping   Position yourself as close as possible to reach work surface. Avoid straining your back.   Copyright  VHI. All rights reserved.  Gardening - Mowing   Keep arms close to sides and walk with lawn mower.   Copyright  VHI. All rights reserved.  Sleeping on Side   Place pillow between knees. Use cervical support under neck and a roll around waist as needed.   Copyright  VHI. All rights reserved.  Log Roll   Lying on back, bend left knee and place left arm across chest. Roll all in one movement to the right. Reverse to roll to the left. Always move as one unit.   Copyright  VHI. All rights reserved.  Stand to Sit / Sit to Stand   To sit: Bend knees to lower self onto front edge of chair, then scoot back on seat. To stand: Reverse sequence by placing one foot forward, and scoot to front of seat. Use rocking motion to stand  up.  Copyright  VHI. All rights reserved.  Posture - Standing   Good posture is important. Avoid slouching and forward head thrust. Maintain curve in low back and align ears over shoul- ders, hips over ankles.   Copyright  VHI. All rights reserved.  Posture - Sitting   Sit upright, head facing forward. Try using a roll to support lower back. Keep shoulders relaxed, and avoid rounded back. Keep hips level with knees. Avoid crossing legs for long periods.   Copyright  VHI. All rights reserved.  Computer Work   Position work to face forward. Use proper work and seat height. Keep shoulders back and down, wrists straight, and elbows at right angles. Use chair that provides full back support. Add footrest and lumbar roll as needed.   Copyright  VHI. All rights reserved.                

## 2019-06-16 ENCOUNTER — Other Ambulatory Visit: Payer: Self-pay

## 2019-06-17 ENCOUNTER — Ambulatory Visit (INDEPENDENT_AMBULATORY_CARE_PROVIDER_SITE_OTHER): Payer: Medicaid Other | Admitting: Family Medicine

## 2019-06-17 ENCOUNTER — Encounter: Payer: Self-pay | Admitting: Family Medicine

## 2019-06-17 ENCOUNTER — Ambulatory Visit: Payer: Medicaid Other | Admitting: Family

## 2019-06-17 DIAGNOSIS — M5441 Lumbago with sciatica, right side: Secondary | ICD-10-CM | POA: Diagnosis not present

## 2019-06-17 DIAGNOSIS — M5442 Lumbago with sciatica, left side: Secondary | ICD-10-CM

## 2019-06-17 DIAGNOSIS — M4317 Spondylolisthesis, lumbosacral region: Secondary | ICD-10-CM

## 2019-06-17 DIAGNOSIS — G8929 Other chronic pain: Secondary | ICD-10-CM

## 2019-06-17 NOTE — Progress Notes (Signed)
Subjective:    Patient ID: Kristin Saunders, female    DOB: 09/13/1990, 28 y.o.   MRN: [id number]   HPI: Kristin Saunders is a 28 y.o. female presenting for back pain. It hurts. Like someone is going to take her leg off.Going down her legs. Right is worse than the left. None of the previous medications given and the exercise program  Helped.Onset 7 months ago. Wants to see ortho. Now has transportation.   Depression screen [facility] 2/9 03/11/2019 02/17/2019 09/17/2018 08/24/2018 07/17/2018  Decreased Interest 0 0 0 0 0  Down, Depressed, Hopeless 0 0 0 0 1  PHQ - 2 Score 0 0 0 0 1  Altered sleeping - - - - -  Tired, decreased energy - - - - -  Change in appetite - - - - -  Feeling bad or failure about yourself  - - - - -  Trouble concentrating - - - - -  Moving slowly or fidgety/restless - - - - -  Suicidal thoughts - - - - -  PHQ-9 Score - - - - -     Relevant past medical, surgical, family and social history reviewed and updated as indicated.  Interim medical history since our last visit reviewed. Allergies and medications reviewed and updated.  ROS:  Review of Systems  Constitutional: Positive for activity change. Negative for appetite change.  HENT: Negative.   Respiratory: Negative.   Gastrointestinal: Negative.   Musculoskeletal: Positive for arthralgias, back pain, gait problem and myalgias.  Neurological: Negative for weakness.  Psychiatric/Behavioral: Negative.      Social History   Tobacco Use  Smoking Status Never Smoker  Smokeless Tobacco Never Used       Objective:     Wt Readings from Last 3 Encounters:  04/03/19 189 lb (85.7 kg)  03/11/19 192 lb (87.1 kg)  02/17/19 189 lb (85.7 kg)     Exam deferred. Pt. Harboring due to COVID 19. Phone visit performed.   Assessment & Plan:   1. Spondylolisthesis at L5-S1 level   2. Chronic bilateral low back pain with bilateral sciatica     No orders of the defined types were placed in this encounter.   Orders  Placed This Encounter  Procedures  . Ambulatory referral to Orthopedics    Referral Priority:   Routine    Referral Type:   Consultation    Number of Visits Requested:   1      Diagnoses and all orders for this visit:  Spondylolisthesis at L5-S1 level -     Ambulatory referral to Orthopedics  Chronic bilateral low back pain with bilateral sciatica -     Ambulatory referral to Orthopedics    Virtual Visit via telephone Note  I discussed the limitations, risks, security and privacy concerns of performing an evaluation and management service by telephone and the availability of in person appointments. The patient was identified with two identifiers. Pt.expressed understanding and agreed to proceed. Pt. Is at home. Dr. Livia Snellen is in his office.  Follow Up Instructions:   I discussed the assessment and treatment plan with the patient. The patient was provided an opportunity to ask questions and all were answered. The patient agreed with the plan and demonstrated an understanding of the instructions.   The patient was advised to call back or seek an in-person evaluation if the symptoms worsen or if the condition fails to improve as anticipated.   Total minutes including chart review and phone contact time: 15  Follow up plan: Return if symptoms worsen or fail to improve.  Claretta Fraise, MD [place]

## 2019-06-27 ENCOUNTER — Other Ambulatory Visit: Payer: Self-pay

## 2019-06-27 ENCOUNTER — Encounter: Payer: Self-pay | Admitting: Orthopaedic Surgery

## 2019-06-27 ENCOUNTER — Ambulatory Visit (INDEPENDENT_AMBULATORY_CARE_PROVIDER_SITE_OTHER): Payer: Medicaid Other | Admitting: Orthopaedic Surgery

## 2019-06-27 VITALS — BP 112/79 | HR 70 | Ht 66.0 in | Wt 189.0 lb

## 2019-06-27 DIAGNOSIS — M5441 Lumbago with sciatica, right side: Secondary | ICD-10-CM

## 2019-06-27 DIAGNOSIS — M4300 Spondylolysis, site unspecified: Secondary | ICD-10-CM

## 2019-06-27 DIAGNOSIS — M5442 Lumbago with sciatica, left side: Secondary | ICD-10-CM

## 2019-06-27 DIAGNOSIS — G8929 Other chronic pain: Secondary | ICD-10-CM | POA: Diagnosis not present

## 2019-06-27 DIAGNOSIS — M4317 Spondylolisthesis, lumbosacral region: Secondary | ICD-10-CM

## 2019-06-27 NOTE — Progress Notes (Signed)
Office Visit Note   Patient: Kristin Saunders           Date of Birth: April 21, 1991           MRN: [id number] Visit Date: 06/27/2019              Requested by: Sharion Balloon, [address] [place],  [place] [address] PCP: Sharion Balloon, FNP   Assessment & Plan: Visit Diagnoses:  1. Spondylolisthesis at L5-S1 level   2. Spondylolysis   3. Chronic bilateral low back pain with bilateral sciatica     Plan: Resubmit request for MRI scan.  Patient's main maximum effort to try and improve her condition switching jobs taking anti-inflammatories she has been on prednisone Dosepak and has been through physical therapy.  Patient has spondylolysis L5 bilateral with spondylolisthesis and nerve root tension signs.  She is having trouble walking take care of her 2 children.  Office follow-up after MRI scan.  Follow-Up Instructions: No follow-ups on file.   Orders:  Orders Placed This Encounter  Procedures  . MR Lumbar Spine w/o contrast   No orders of the defined types were placed in this encounter.     Procedures: No procedures performed   Clinical Data: No additional findings.   Subjective: Chief Complaint  Patient presents with  . 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.  She is to work at [employer] but had to do more walking and had more weakness pain in her back.  Patient's had persistent pain with standing and with walking.  Current job occasionally they let her sit down shortly but she is starting to have gradual increase pain making it difficult for her to work and she is concerned she may lose her job.  Patient has been on anti-inflammatories taking prednisone Dosepak has been through physical therapy without relief.  Bilateral lateral foot plantar foot numbness and radiographs show bilateral L5 spondylolysis with spondylolisthesis.  Patient no associated bowel bladder symptoms.  She is having  increased problems walking.  Patient's weight has been stable at BMI of 30.  Review of Systems 14 point review of system positive previous childbirth x2 history of acid reflux chronic low back pain.  Non-smoker she does not drink.   Objective: Vital Signs: BP 112/79   Pulse 70   Ht 5\' 6"  (1.676 m)   Wt 189 lb (85.7 kg)   BMI 30.51 kg/m   Physical Exam Constitutional:      Appearance: She is well-developed.  HENT:     Head: Normocephalic.     Right Ear: External ear normal.     Left Ear: External ear normal.  Eyes:     Pupils: Pupils are equal, round, and reactive to light.  Neck:     Thyroid: No thyromegaly.     Trachea: No tracheal deviation.  Cardiovascular:     Rate and Rhythm: Normal rate.  Pulmonary:     Effort: Pulmonary effort is normal.  Abdominal:     Palpations: Abdomen is soft.  Skin:    General: Skin is warm and dry.  Neurological:     Mental Status: She is alert and oriented to person, place, and time.  Psychiatric:        Behavior: Behavior normal.     Ortho Exam patient slow getting from sitting to standing position.  She can ambulate has some difficulty with toe walking but  can heel walk.  Bilateral tight hamstrings fingertips to mid tibial level.  No pain with hip range of motion knee and ankle jerk are intact anterior tib gastrocsoleus is intact no calf atrophy.  Peroneals are strong.  Sciatic notch tenderness both right and left.  Positive straight leg raising 60 degrees both right and left.  Specialty Comments:  No specialty comments available.  Imaging: No results found.   PMFS History: Patient Active Problem List   Diagnosis Date Noted  . Spondylolisthesis at L5-S1 level 03/11/2019  . Spondylolysis 03/11/2019  . Chronic bilateral low back pain with bilateral sciatica 03/11/2019  . Obesity (BMI 30.0-34.9) 03/19/2018   Past Medical History:  Diagnosis Date  . Medical history non-contributory     Family History  Problem Relation Age of  Onset  . Cancer Father        COLON    Past Surgical History:  Procedure Laterality Date  . NO PAST SURGERIES    . TUBAL LIGATION     Social History   Occupational History  . Not on file  Tobacco Use  . Smoking status: Never Smoker  . Smokeless tobacco: Never Used  Substance and Sexual Activity  . Alcohol use: Yes    Comment: rare  . Drug use: No  . Sexual activity: Not on file

## 2019-06-30 ENCOUNTER — Encounter: Payer: Self-pay | Admitting: Orthopaedic Surgery

## 2019-07-02 DIAGNOSIS — R05 Cough: Secondary | ICD-10-CM | POA: Diagnosis not present

## 2019-07-02 DIAGNOSIS — R509 Fever, unspecified: Secondary | ICD-10-CM | POA: Diagnosis not present

## 2019-07-23 ENCOUNTER — Ambulatory Visit (HOSPITAL_COMMUNITY): Payer: Medicaid Other

## 2019-07-25 DIAGNOSIS — G4489 Other headache syndrome: Secondary | ICD-10-CM | POA: Diagnosis not present

## 2019-07-29 ENCOUNTER — Ambulatory Visit (INDEPENDENT_AMBULATORY_CARE_PROVIDER_SITE_OTHER): Payer: Medicaid Other | Admitting: Family

## 2019-07-29 ENCOUNTER — Ambulatory Visit: Payer: Medicaid Other | Admitting: Orthopaedic Surgery

## 2019-07-29 ENCOUNTER — Encounter: Payer: Self-pay | Admitting: Family

## 2019-07-29 DIAGNOSIS — Z20828 Contact with and (suspected) exposure to other viral communicable diseases: Secondary | ICD-10-CM | POA: Diagnosis not present

## 2019-07-29 DIAGNOSIS — Z20822 Contact with and (suspected) exposure to covid-19: Secondary | ICD-10-CM

## 2019-07-29 DIAGNOSIS — R079 Chest pain, unspecified: Secondary | ICD-10-CM | POA: Diagnosis not present

## 2019-07-29 NOTE — Progress Notes (Signed)
   Virtual Visit via telephone Note Due to COVID-19 pandemic this visit was conducted virtually. This visit type was conducted due to national recommendations for restrictions regarding the COVID-19 Pandemic (e.g. social distancing, sheltering in place) in an effort to limit this patient's exposure and mitigate transmission in our community. All issues noted in this document were discussed and addressed.  A physical exam was not performed with this format.  I connected with Kristin Saunders on 07/29/19 at 11:43 AM by telephone and verified that I am speaking with the correct person using two identifiers. Kristin Saunders is currently located at home and no one is currently with her during visit. The provider, Evelina Dun, FNP is located in their office at time of visit.  I discussed the limitations, risks, security and privacy concerns of performing an evaluation and management service by telephone and the availability of in person appointments. I also discussed with the patient that there may be a patient responsible charge related to this service. The patient expressed understanding and agreed to proceed.   History and Present Illness:  URI  This is a new problem. The current episode started in the past 7 days (2 days ago). The problem has been gradually worsening. There has been no fever. Associated symptoms include ear pain, headaches, rhinorrhea, sinus pain and a sore throat. Pertinent negatives include no congestion, coughing, nausea or sneezing. She has tried increased fluids and acetaminophen for the symptoms. The treatment provided mild relief.      Review of Systems  HENT: Positive for ear pain, rhinorrhea, sinus pain and sore throat. Negative for congestion and sneezing.   Respiratory: Negative for cough.   Gastrointestinal: Negative for nausea.  Neurological: Positive for headaches.  All other systems reviewed and are negative.    Observations/Objective: No SOB or distress  Noted    Assessment and Plan: 1. Encounter by telehealth for suspected COVID-19 Self isolate PT will go get COVID tested today Rest Force Fluids Tylenol as needed Call if symptoms worsen or do not improve     I discussed the assessment and treatment plan with the patient. The patient was provided an opportunity to ask questions and all were answered. The patient agreed with the plan and demonstrated an understanding of the instructions.   The patient was advised to call back or seek an in-person evaluation if the symptoms worsen or if the condition fails to improve as anticipated.  The above assessment and management plan was discussed with the patient. The patient verbalized understanding of and has agreed to the management plan. Patient is aware to call the clinic if symptoms persist or worsen. Patient is aware when to return to the clinic for a follow-up visit. Patient educated on when it is appropriate to go to the emergency department.   Time call ended:  10:50 AM  I provided 7 minutes of non-face-to-face time during this encounter.    Evelina Dun, FNP

## 2019-07-30 ENCOUNTER — Other Ambulatory Visit: Payer: Self-pay

## 2019-07-30 ENCOUNTER — Emergency Department (HOSPITAL_COMMUNITY)
Admission: EM | Admit: 2019-07-30 | Discharge: 2019-07-30 | Disposition: A | Discharge status: Home / Self Care | Payer: Medicaid Other | Attending: Emergency Medicine | Admitting: Emergency Medicine

## 2019-07-30 ENCOUNTER — Encounter (HOSPITAL_COMMUNITY): Payer: Self-pay | Admitting: *Deleted

## 2019-07-30 DIAGNOSIS — R0789 Other chest pain: Secondary | ICD-10-CM | POA: Diagnosis not present

## 2019-07-30 DIAGNOSIS — R319 Hematuria, unspecified: Secondary | ICD-10-CM | POA: Diagnosis not present

## 2019-07-30 DIAGNOSIS — R040 Epistaxis: Secondary | ICD-10-CM | POA: Diagnosis not present

## 2019-07-30 DIAGNOSIS — Z5321 Procedure and treatment not carried out due to patient leaving prior to being seen by health care provider: Secondary | ICD-10-CM | POA: Diagnosis not present

## 2019-07-30 LAB — COMPREHENSIVE METABOLIC PANEL
ALT: 16 U/L (ref 0–44)
AST: 18 U/L (ref 15–41)
Albumin: 4.1 g/dL (ref 3.5–5.0)
Alkaline Phosphatase: 90 U/L (ref 38–126)
Anion gap: 10 (ref 5–15)
BUN: 10 mg/dL (ref 6–20)
CO2: 25 mmol/L (ref 22–32)
Calcium: 9.3 mg/dL (ref 8.9–10.3)
Chloride: 101 mmol/L (ref 98–111)
Creatinine, Ser: 0.61 mg/dL (ref 0.44–1.00)
GFR calc Af Amer: >60 mL/min (ref >60)
GFR calc non Af Amer: >60 mL/min (ref >60)
Glucose, Bld: 87 mg/dL (ref 70–99)
Potassium: 4.4 mmol/L (ref 3.5–5.1)
Sodium: 136 mmol/L (ref 135–145)
Total Bilirubin: 0.6 mg/dL (ref 0.3–1.2)
Total Protein: 7.5 g/dL (ref 6.5–8.1)

## 2019-07-30 LAB — CBC
HCT: 45.7 % (ref 36.0–46.0)
Hemoglobin: 14.9 g/dL (ref 12.0–15.0)
MCH: 31.4 pg (ref 26.0–34.0)
MCHC: 32.6 g/dL (ref 30.0–36.0)
MCV: 96.4 fL (ref 80.0–100.0)
Platelets: 246 10*3/uL (ref 150–400)
RBC: 4.74 MIL/uL (ref 3.87–5.11)
RDW: 13 % (ref 11.5–15.5)
WBC: 5.3 10*3/uL (ref 4.0–10.5)
nRBC: 0 % (ref 0.0–0.2)

## 2019-07-30 LAB — LIPASE, BLOOD: Lipase: 24 U/L (ref 11–51)

## 2019-07-30 NOTE — ED Notes (Signed)
Registration notified triage RN that pt had left.

## 2019-07-30 NOTE — ED Triage Notes (Signed)
Pt c/o hematuria, epistaxis, chest pain, RLQ abdominal pain that started last night. Denies n/v/d.

## 2019-07-31 ENCOUNTER — Ambulatory Visit (HOSPITAL_COMMUNITY): Payer: Medicaid Other

## 2019-07-31 ENCOUNTER — Encounter: Payer: Self-pay | Admitting: Family Medicine

## 2019-07-31 ENCOUNTER — Encounter: Payer: Medicaid Other | Admitting: Family Medicine

## 2019-07-31 NOTE — Progress Notes (Signed)
This encounter was created in error - please disregard.

## 2019-09-11 ENCOUNTER — Other Ambulatory Visit: Payer: Self-pay

## 2019-09-11 ENCOUNTER — Ambulatory Visit (HOSPITAL_COMMUNITY)
Admission: RE | Admit: 2019-09-11 | Discharge: 2019-09-11 | Disposition: A | Discharge status: Home / Self Care | Payer: Medicaid Other | Source: Ambulatory Visit | Attending: Orthopaedic Surgery | Admitting: Orthopaedic Surgery

## 2019-09-11 DIAGNOSIS — M4317 Spondylolisthesis, lumbosacral region: Secondary | ICD-10-CM | POA: Diagnosis not present

## 2019-09-11 DIAGNOSIS — M545 Low back pain: Secondary | ICD-10-CM | POA: Diagnosis not present

## 2019-09-11 IMAGING — MR MR LUMBAR SPINE W/O CM
4 of 6 series · 15 of 48 positions shown · non-contrast
Comparison: Lumbar radiographs dated [DATE]

CLINICAL DATA: Low back pain and bilateral lower extremity pain and
weakness and numbness for 1 year.

EXAM:
MRI LUMBAR SPINE WITHOUT CONTRAST
TECHNIQUE: Multiplanar, multisequence MR imaging of the lumbar spine was
performed. No intravenous contrast was administered.

[Series 3: T2 · sagittal · 4.0mm · 0.69mm/px · 4 of 13 slices shown (1 of 3)]
[im 1/13]
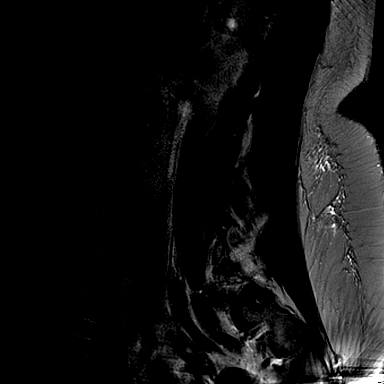
[im 5/13]
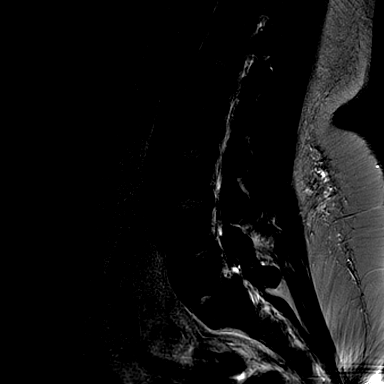
[im 9/13]
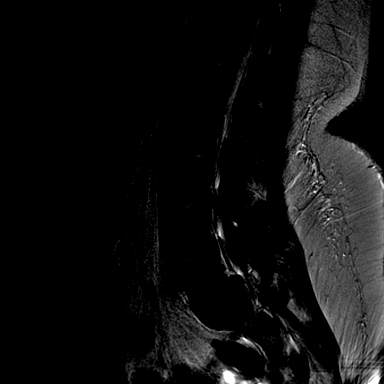
[im 13/13]
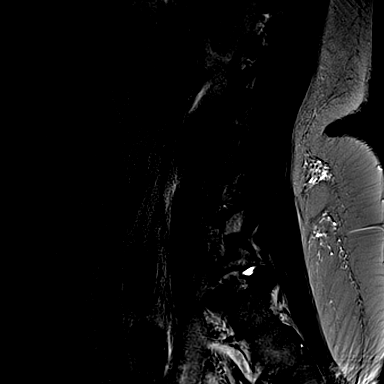

[Series 4: T1 · sagittal · 4.0mm · 0.41mm/px · 3 of 13 slices shown]
[im 1/13]
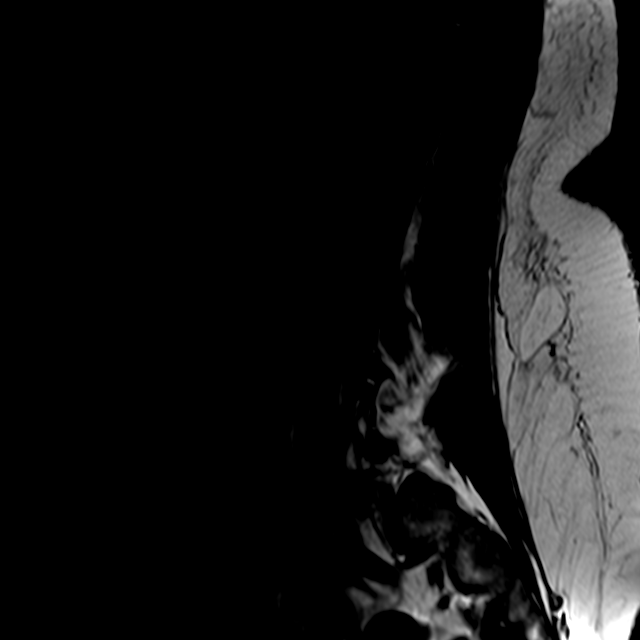
[im 9/13]
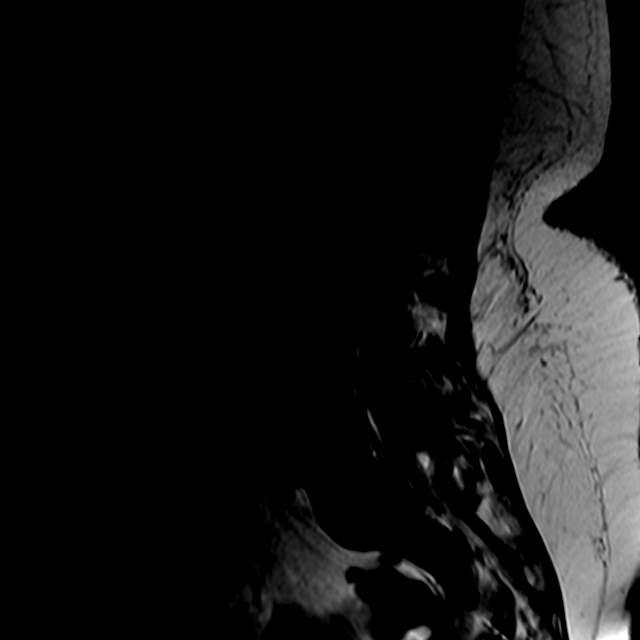
[im 13/13]
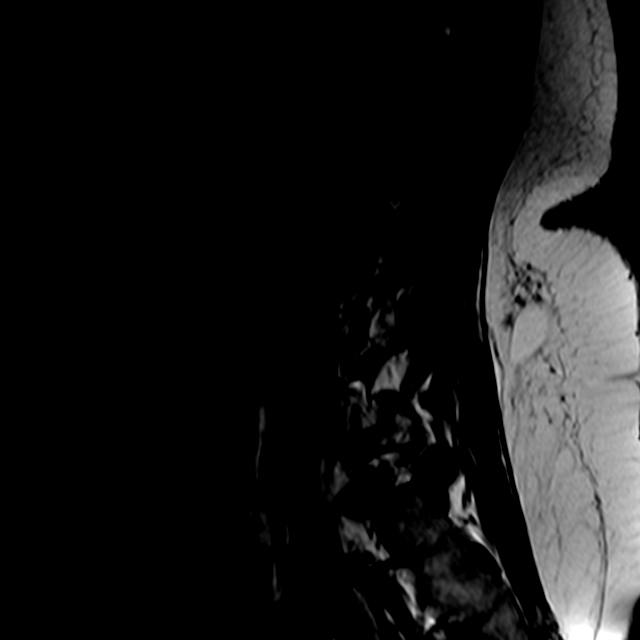

[Series 6: T2 · axial · 4.0mm · 0.24mm/px · z∈[-135,+35]mm · 5 of 40 slices shown (2 of 3)]
[im 1/40]
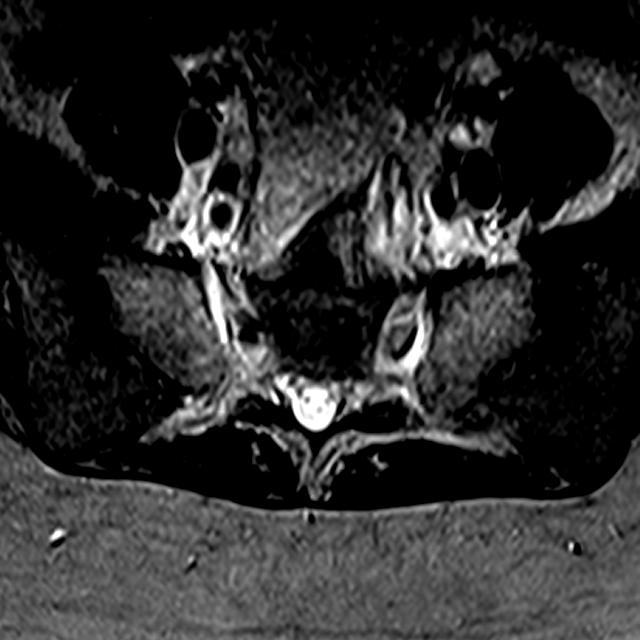
[im 6/40]
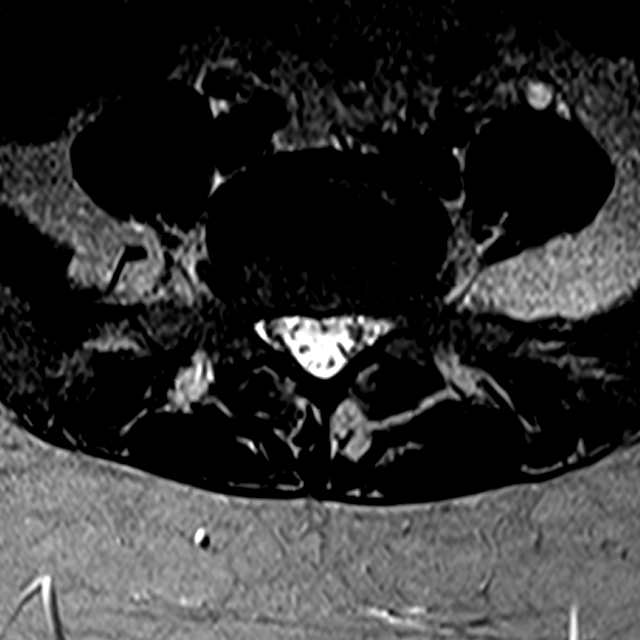
[im 12/40]
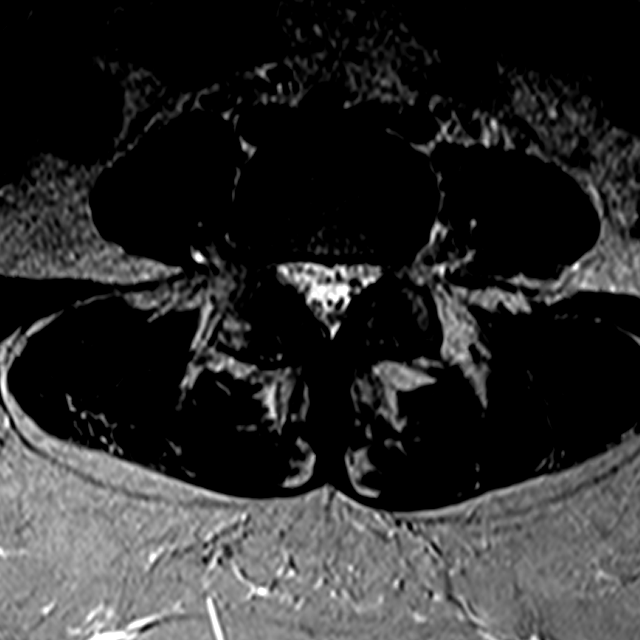
[im 20/40]
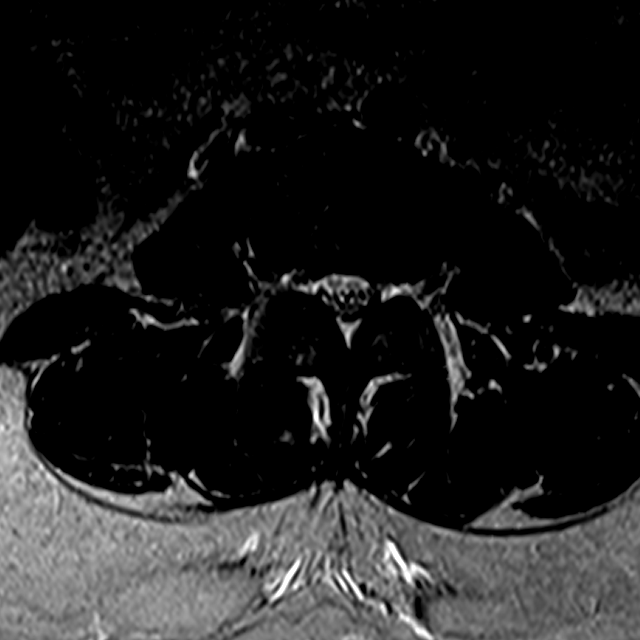
[im 34/40]
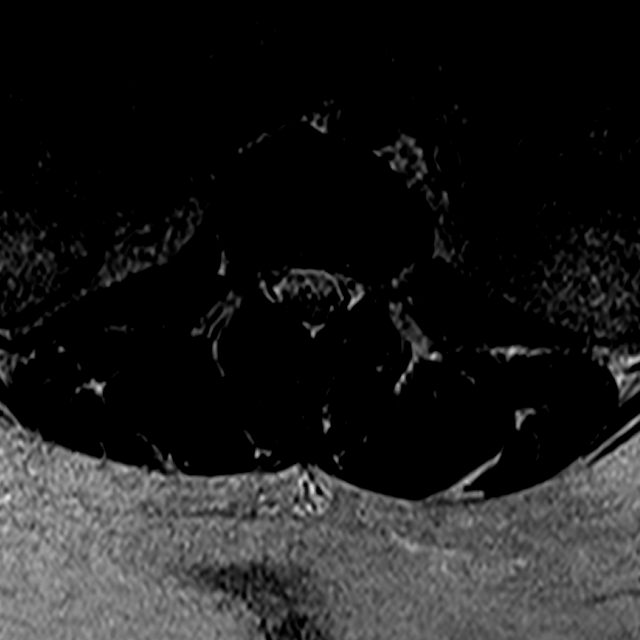

[Series 8: T2 · sagittal · 4.0mm · 0.41mm/px · 3 of 13 slices shown (3 of 3)]
[im 1/13]
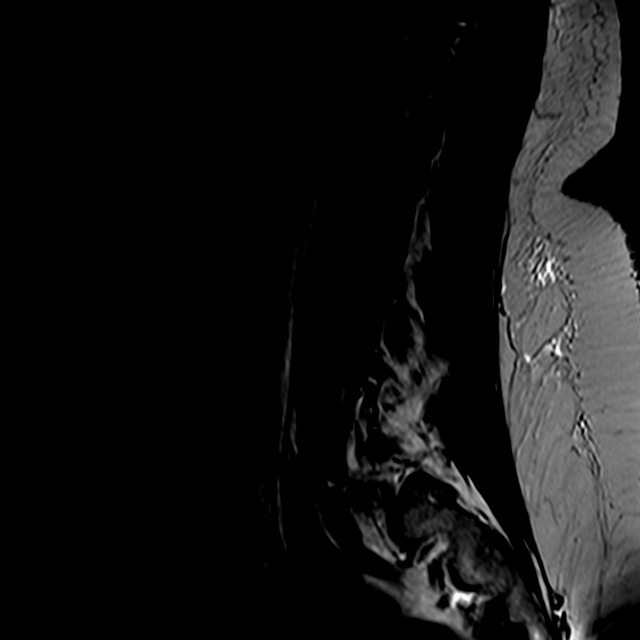
[im 7/13]
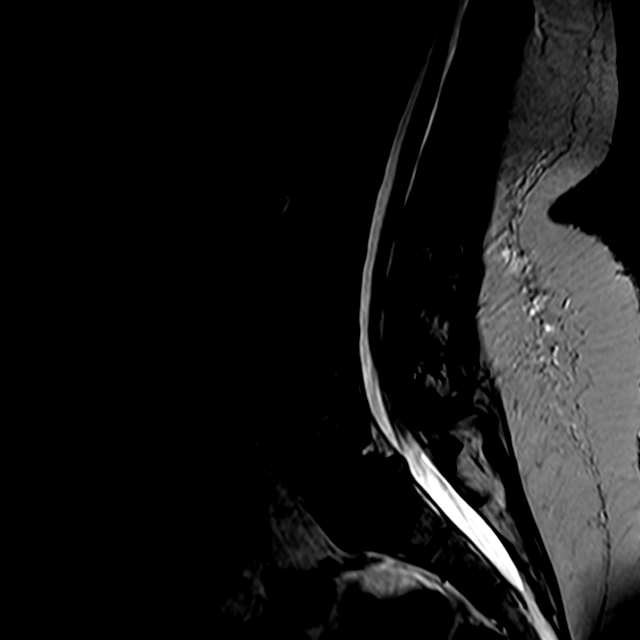
[im 13/13]
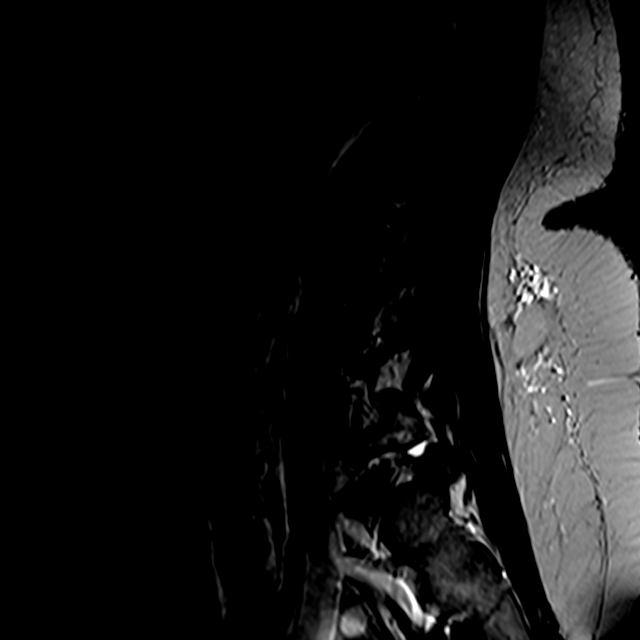

[15 of 48 positions shown; findings below may reference images not displayed]

FINDINGS: Segmentation:  Standard.

Alignment: 5 mm spondylolisthesis of L5 on S1 due to bilateral pars
defects. Otherwise normal.

Vertebrae:  Bilateral pars defects at L5. Otherwise normal.

Conus medullaris and cauda equina: Conus extends to the L1-2 level.
Conus and cauda equina appear normal.

Paraspinal and other soft tissues: Negative.

Disc levels:

T11-12 through L4-5: The discs are normal. No foraminal or spinal
stenosis. No facet arthritis.

L5-S1: Broad-based soft disc protrusion slightly asymmetric to the
right extending into both neural foramina. 5 mm spondylolisthesis
with bilateral pars defects. Both L5 nerves are compressed in the
neural foramina best demonstrated on images 3, 9 and 10 of series 8.
No impingement upon the thecal sac.
IMPRESSION: 1. Bilateral pars defects at L5 with 5 mm spondylolisthesis and a
broad-based soft disc protrusion which compresses both L5 nerves in
the neural foramina.
2. Otherwise, normal exam.

## 2019-09-15 ENCOUNTER — Telehealth: Payer: Self-pay | Admitting: Family

## 2019-09-15 NOTE — Telephone Encounter (Signed)
I am unsure why Dr.Yates would tell her to call us. The MRI is ordered under his name. Has she seen him in the office?

## 2019-09-15 NOTE — Telephone Encounter (Signed)
Patient states that the last time she was in his office he ordered the MRI at [facility].  Advised patient she will need to contact his office for results

## 2019-09-16 ENCOUNTER — Ambulatory Visit (INDEPENDENT_AMBULATORY_CARE_PROVIDER_SITE_OTHER): Payer: Medicaid Other | Admitting: Orthopaedic Surgery

## 2019-09-16 ENCOUNTER — Other Ambulatory Visit: Payer: Self-pay

## 2019-09-16 ENCOUNTER — Encounter: Payer: Self-pay | Admitting: Orthopaedic Surgery

## 2019-09-16 VITALS — BP 111/71 | HR 70 | Ht 66.0 in | Wt 187.0 lb

## 2019-09-16 DIAGNOSIS — G8929 Other chronic pain: Secondary | ICD-10-CM | POA: Diagnosis not present

## 2019-09-16 DIAGNOSIS — M4317 Spondylolisthesis, lumbosacral region: Secondary | ICD-10-CM

## 2019-09-16 DIAGNOSIS — M5441 Lumbago with sciatica, right side: Secondary | ICD-10-CM

## 2019-09-16 DIAGNOSIS — M5442 Lumbago with sciatica, left side: Secondary | ICD-10-CM

## 2019-09-16 DIAGNOSIS — M4300 Spondylolysis, site unspecified: Secondary | ICD-10-CM

## 2019-09-16 NOTE — Progress Notes (Addendum)
Office Visit Note   Patient: Kristin Saunders           Date of Birth: 10/28/1990           MRN: [id number] Visit Date: 09/16/2019              Requested by: Sharion Balloon, [place],  [place] [address] PCP: Sharion Balloon, FNP   Assessment & Plan: Visit Diagnoses:  1. Spondylolysis   2. Spondylolisthesis at L5-S1 level   3. Chronic bilateral low back pain with bilateral sciatica     Plan: Patient has single level problem with spondylolisthesis severe foraminal stenosis right greater than left with some L5 weakness on the right and positive nerve root tension signs.  She has been through medication without relief.  Her father is here today we reviewed the images and report.  The need to make arrangements for transportation so we can go through physical therapy.  We will set her up for lumbar epidural injection x1 and check her back again in 8 weeks.  She is going to work on some weight loss we discussed possibly she may require lumbar  fusion for spondylolisthesis with severe foraminal stenosis and radiculopathy with right L5 nerve root weakness.  Follow-Up Instructions: Return in about 8 weeks (around 11/11/2019).   Orders:  Orders Placed This Encounter  Procedures  . Ambulatory referral to Physical Medicine Rehab  . Ambulatory referral to Physical Therapy   No orders of the defined types were placed in this encounter.     Procedures: No procedures performed   Clinical Data: No additional findings.   Subjective: Chief Complaint  Patient presents with  . Lower Back - Pain, Follow-up    MRI Lumbar Review    HPI 29 year old female here with chronic back pain right greater than left leg pain.  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  She has children she is here with her father who has some problems with what  sounds like spinal stenosis and has a pending epidural.  Patient said prednisone pack without relief.  She had physical therapy ordered in the past but had trouble with transportation since she did not have a car.  She has children her mother helps with childcare.  She has back pain worse with standing will she gets some relief with supine position.  Increased pain with turning and twisting.  MRI scan showed bilateral L5 spondylolysis with grade 1 spondylolisthesis and bilateral foraminal stenosis single level only.  All other levels showed normal hydrated disc without compression.  Patient denies chills or fever no associated bowel or bladder problems.  Non-smoker.  Review of Systems Positive for childbirth, acid reflux non-smoker.  Chronic back pain greater than a year.  Right greater than left leg weakness.  Objective: Vital Signs: BP 111/71   Pulse 70   Ht 5\' 6"  (1.676 m)   Wt 187 lb (84.8 kg)   BMI 30.18 kg/m   Physical Exam Constitutional:      Appearance: She is well-developed.  HENT:     Head: Normocephalic.     Right Ear: External ear normal.     Left Ear: External ear normal.  Eyes:     Pupils: Pupils are equal, round, and reactive to light.  Neck:     Thyroid: No thyromegaly.  Trachea: No tracheal deviation.  Cardiovascular:     Rate and Rhythm: Normal rate.  Pulmonary:     Effort: Pulmonary effort is normal.  Abdominal:     Palpations: Abdomen is soft.  Skin:    General: Skin is warm and dry.  Neurological:     Mental Status: She is alert and oriented to person, place, and time.  Psychiatric:        Behavior: Behavior normal.     Ortho Exam patient has positive straight leg raising 60 degrees right and left.  Forward flexion limited with tight hamstrings fingertips to mid tibia almost ankle.  She has some anterior tib weakness on the right one half grade.  Normal on the left.  Specialty Comments:  No specialty comments available.  Imaging:CLINICAL DATA:   Low back pain and bilateral lower extremity pain and weakness and numbness for 1 year.  EXAM: MRI LUMBAR SPINE WITHOUT CONTRAST  TECHNIQUE: Multiplanar, multisequence MR imaging of the lumbar spine was performed. No intravenous contrast was administered.  COMPARISON:  Lumbar radiographs dated 03/11/2019  FINDINGS: Segmentation:  Standard.  Alignment: 5 mm spondylolisthesis of L5 on S1 due to bilateral pars defects. Otherwise normal.  Vertebrae:  Bilateral pars defects at L5. Otherwise normal.  Conus medullaris and cauda equina: Conus extends to the L1-2 level. Conus and cauda equina appear normal.  Paraspinal and other soft tissues: Negative.  Disc levels:  T11-12 through L4-5: The discs are normal. No foraminal or spinal stenosis. No facet arthritis.  L5-S1: Broad-based soft disc protrusion slightly asymmetric to the right extending into both neural foramina. 5 mm spondylolisthesis with bilateral pars defects. Both L5 nerves are compressed in the neural foramina best demonstrated on images 3, 9 and 10 of series 8. No impingement upon the thecal sac.  IMPRESSION: 1. Bilateral pars defects at L5 with 5 mm spondylolisthesis and a broad-based soft disc protrusion which compresses both L5 nerves in the neural foramina. 2. Otherwise, normal exam.   Electronically Signed   By: Francene Boyers M.D.   On: 09/11/2019 16:06     PMFS History: Patient Active Problem List   Diagnosis Date Noted  . Spondylolisthesis at L5-S1 level 03/11/2019  . Spondylolysis 03/11/2019  . Chronic bilateral low back pain with bilateral sciatica 03/11/2019  . Obesity (BMI 30.0-34.9) 03/19/2018   Past Medical History:  Diagnosis Date  . Medical history non-contributory     Family History  Problem Relation Age of Onset  . Cancer Father        COLON    Past Surgical History:  Procedure Laterality Date  . NO PAST SURGERIES    . TUBAL LIGATION     Social History    Occupational History  . Not on file  Tobacco Use  . Smoking status: Never Smoker  . Smokeless tobacco: Never Used  Substance and Sexual Activity  . Alcohol use: Yes    Comment: rare  . Drug use: No  . Sexual activity: Not on file

## 2019-09-24 ENCOUNTER — Ambulatory Visit: Payer: Medicaid Other | Attending: Orthopaedic Surgery | Admitting: Physical Therapy

## 2019-09-24 ENCOUNTER — Other Ambulatory Visit: Payer: Self-pay

## 2019-09-24 ENCOUNTER — Encounter: Payer: Self-pay | Admitting: Physical Therapy

## 2019-09-24 DIAGNOSIS — R293 Abnormal posture: Secondary | ICD-10-CM | POA: Diagnosis not present

## 2019-09-24 DIAGNOSIS — M5442 Lumbago with sciatica, left side: Secondary | ICD-10-CM | POA: Insufficient documentation

## 2019-09-24 DIAGNOSIS — M5441 Lumbago with sciatica, right side: Secondary | ICD-10-CM | POA: Insufficient documentation

## 2019-09-24 DIAGNOSIS — M6281 Muscle weakness (generalized): Secondary | ICD-10-CM | POA: Insufficient documentation

## 2019-09-24 DIAGNOSIS — G8929 Other chronic pain: Secondary | ICD-10-CM | POA: Diagnosis not present

## 2019-09-24 NOTE — Therapy (Signed)
[facility] [address] [place], [place], [address] Phone: [phone]   Fax:  [phone]  Physical Therapy Evaluation  Patient Details  Name: Kristin Saunders MRN: [id number] Date of Birth: 1990-12-01 Referring Provider (PT): Annell Greening   Encounter Date: 09/24/2019  PT End of Session - 09/24/19 1505    Visit Number  1    Number of Visits  6    Date for PT Re-Evaluation  10/22/19    Authorization Type  Medicaid    PT Start Time  1300    PT Stop Time  1333    PT Time Calculation (min)  33 min    Activity Tolerance  Patient limited by pain    Behavior During Therapy  [facility] for tasks assessed/performed       Past Medical History:  Diagnosis Date  . Medical history non-contributory     Past Surgical History:  Procedure Laterality Date  . NO PAST SURGERIES    . TUBAL LIGATION      There were no vitals filed for this visit.   Subjective Assessment - 09/24/19 1456    Subjective  COVID-19 screening performed upon arrival.Patient arrives to physical therapy with reports of ongoing low back pain and bilateral neurological symptoms to bilateral feet that limits her ability to perform ADLs and home activities. Patient reports pain has progressively worsened in the past year. Patient reports difficulties with standing and walking for greater than 5 minutes. Patient requires seated rest breaks while preparing and cooking meals. Patient must sit and bathe instead of shower due to pain with standing. Patient reports getting assistance from her mother with ADLs and home activities as needed. Patient report pain at worst as 10/10 with lifting and bending forward. Patient reports pain at best as 5-6/10. Patient's goals are to decrease pain, improve movement, improve strength, and improve ability to perform ADLs and home tasks.    Pertinent History  Bilateral pars defects at L5 with 5 mm spondylolisthesis and abroad-based soft disc protrusion which  compresses both L5 nerves inthe neural foramina    Limitations  Walking;House hold activities;Standing    How long can you sit comfortably?  unlimited    How long can you stand comfortably?  ~5 mins    How long can you walk comfortably?  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    Currently in Pain?  Yes    Pain Score  5     Pain Location  Back    Pain Orientation  Lower    Pain Descriptors / Indicators  Numbness;Throbbing;Sore;Sharp    Pain Type  Chronic pain    Pain Radiating Towards  bilateral feet    Pain Onset  More than a month ago    Pain Frequency  Constant    Aggravating Factors   "moving around and lifting"    Pain Relieving Factors  "sitting down"    Effect of Pain on Daily Activities  "It takes me forever to cook and clean"         [facility] PT Assessment - 09/24/19 0001      Assessment   Medical Diagnosis  L5 Spondylolysis    Referring Provider (PT)  Annell Greening    Onset Date/Surgical Date  --   ~ one year   Next MD Visit  to be made  Prior Therapy  yes      Precautions   Precautions  None      Restrictions   Weight Bearing Restrictions  No      Balance Screen   Has the patient fallen in the past 6 months  No    Has the patient had a decrease in activity level because of a fear of falling?   No    Is the patient reluctant to leave their home because of a fear of falling?   No      Home Environment   Living Environment  Private residence    Living Arrangements  Parent      Prior Function   Level of Independence  Independent with basic ADLs      Posture/Postural Control   Posture/Postural Control  Postural limitations    Postural Limitations  Rounded Shoulders;Forward head;Increased lumbar lordosis;Flexed trunk      Deep Tendon Reflexes   DTR Assessment Site  Patella;Achilles    Patella DTR  2+    Achilles  DTR  2+      ROM / Strength   AROM / PROM / Strength  AROM;Strength      AROM   Overall AROM   Deficits;Due to pain    AROM Assessment Site  Lumbar    Lumbar Flexion  6" finger tip to floor    Lumbar - Right Side Bend  23" finger tip to floor    Lumbar - Left Side Bend  21" finger tip to floor      Strength   Strength Assessment Site  Hip;Knee    Right/Left Hip  Right;Left    Right Hip Flexion  4+/5    Right Hip Extension  4/5    Right Hip ABduction  4/5    Left Hip Flexion  4+/5    Left Hip Extension  4/5    Left Hip ABduction  4/5    Right/Left Knee  Right;Left    Right Knee Flexion  4+/5    Right Knee Extension  4+/5    Left Knee Flexion  4+/5    Left Knee Extension  4+/5      Palpation   Palpation comment  Very tender to palpation bilaterally in lumbar paraspinals and bilateral QLs, tenderness to SIJ bilaterally      Special Tests    Special Tests  Lumbar    Lumbar Tests  Straight Leg Raise      Slump test   Findings  Positive    Side  Left   bilaterally   Comment  reproduction of neurological symptoms bilaterally      Transfers   Transfers  Independent with all Transfers      Ambulation/Gait   Gait Pattern  Step-through pattern;Trunk flexed                Objective measurements completed on examination: See above findings.              PT Education - 09/24/19 1505    Education Details  draw ins, scapular retractions, single knee to chest, marching in supine, LTR    Person(s) Educated  Patient    Methods  Explanation;Demonstration;Handout    Comprehension  Returned demonstration;Verbalized understanding       PT Short Term Goals - 09/24/19 1506      PT SHORT TERM GOAL #1   Title  Patient will be independent with HEP    Baseline  no knowledge of HEP  Time  3    Period  Weeks    Status  New      PT SHORT TERM GOAL #2   Title  Patient will demonstrate proper log rolling bed mobility to protect spine during transitional  movements.    Baseline  Supine to long sit transition.    Time  3    Period  Weeks    Status  New      PT SHORT TERM GOAL #3   Title  ---        PT Long Term Goals - 09/24/19 1506      PT LONG TERM GOAL #1   Title  Patient will be independent with advanced HEP    Baseline  no knowledge of exercises    Time  4    Period  Weeks    Status  New      PT LONG TERM GOAL #2   Title  Patient will demonstrate 4+/5 bilateral hip abduction and extension MMT to improve stablity during functional tasks    Baseline  4/5 bilaterally in abduction and extension    Time  4    Period  Weeks    Status  New      PT LONG TERM GOAL #3   Title  Patient will report ability to perform ADLs with low back pain less than or equal to 5/10.    Baseline  at worst 10/10 in low back with activities    Time  4    Period  Weeks    Status  New      PT LONG TERM GOAL #4   Title  Patient will report a centralization of neurological symptoms to bilateral knees or higher to decrease bilateral nerve irritation    Baseline  neurological symptoms of numbness and tingling to bilateral feet.    Time  4    Period  Weeks    Status  New             Plan - 09/24/19 1518    Clinical Impression Statement  Patient is a 29 year old female who presents to physical therapy with chronic low back pain and bilateral neurological symptoms that run to bilateral feet that has progressed in the past year. Patient noted with decreased bilateral hip abduction and extension MMT. Patient noted with normal patella reflexes. Patient very tender to palpation to bilateral lumbar paraspinals, upper gluteals, bilateral QLs and bilateral SIJ. Patient (+) for sacral compression test. Patient and PT discussed HEP as well as importance of performing to maximize PT benefit. Patient reported understanding. Patient would benefit from skilled physical therapy to address deficits and address patient's goals.    Personal Factors and Comorbidities   Age    Examination-Activity Limitations  Bend;Squat;Sleep;Stand;Stairs;Transfers;Dressing    Examination-Participation Restrictions  Cleaning;Meal Prep    Stability/Clinical Decision Making  Stable/Uncomplicated    Clinical Decision Making  Low    Rehab Potential  Fair    PT Frequency  2x / week    PT Duration  4 weeks    PT Treatment/Interventions  ADLs/Self Care Home Management;Iontophoresis 4mg /ml Dexamethasone;Moist Heat;Ultrasound;Traction;Electrical Stimulation;Cryotherapy;Gait training;Stair training;Functional mobility training;Therapeutic activities;Therapeutic exercise;Passive range of motion;Dry needling;Neuromuscular re-education;Manual techniques;Taping;Patient/family education;Spinal Manipulations    PT Next Visit Plan  Nustep, core strengthening and stabilization, LE strengthening and stretching, modalities PRN for pain relief    PT Home Exercise Plan  see patient education section    Consulted and Agree with Plan of Care  Patient  Patient will benefit from skilled therapeutic intervention in order to improve the following deficits and impairments:  Pain, Postural dysfunction, Difficulty walking, Decreased range of motion, Decreased mobility, Decreased activity tolerance, Decreased strength  Visit Diagnosis: Chronic bilateral low back pain with bilateral sciatica - Plan: PT plan of care cert/re-cert  Muscle weakness (generalized) - Plan: PT plan of care cert/re-cert  Abnormal posture - Plan: PT plan of care cert/re-cert     Problem List Patient Active Problem List   Diagnosis Date Noted  . Spondylolisthesis at L5-S1 level 03/11/2019  . Spondylolysis 03/11/2019  . Chronic bilateral low back pain with bilateral sciatica 03/11/2019  . Obesity (BMI 30.0-34.9) 03/19/2018    Guss Bunde, PT, DPT 09/24/2019, 3:22 PM  [facility] [address] [place], [place], [address] Phone: [phone]   Fax:   [phone]  Name: Kristin Saunders MRN: [id number] Date of Birth: 13-Aug-1991

## 2019-09-27 DIAGNOSIS — R52 Pain, unspecified: Secondary | ICD-10-CM | POA: Diagnosis not present

## 2019-09-27 DIAGNOSIS — R1111 Vomiting without nausea: Secondary | ICD-10-CM | POA: Diagnosis not present

## 2019-09-27 DIAGNOSIS — W19XXXA Unspecified fall, initial encounter: Secondary | ICD-10-CM | POA: Diagnosis not present

## 2019-09-27 DIAGNOSIS — G4489 Other headache syndrome: Secondary | ICD-10-CM | POA: Diagnosis not present

## 2019-09-27 DIAGNOSIS — R110 Nausea: Secondary | ICD-10-CM | POA: Diagnosis not present

## 2019-10-03 ENCOUNTER — Ambulatory Visit: Payer: Medicaid Other | Admitting: Physical Therapy

## 2019-10-06 ENCOUNTER — Encounter: Payer: Self-pay | Admitting: Physical Therapy

## 2019-10-06 ENCOUNTER — Ambulatory Visit: Payer: Medicaid Other | Admitting: Physical Therapy

## 2019-10-06 ENCOUNTER — Other Ambulatory Visit: Payer: Self-pay

## 2019-10-06 DIAGNOSIS — M5441 Lumbago with sciatica, right side: Secondary | ICD-10-CM | POA: Diagnosis not present

## 2019-10-06 DIAGNOSIS — R293 Abnormal posture: Secondary | ICD-10-CM

## 2019-10-06 DIAGNOSIS — G8929 Other chronic pain: Secondary | ICD-10-CM | POA: Diagnosis not present

## 2019-10-06 DIAGNOSIS — M6281 Muscle weakness (generalized): Secondary | ICD-10-CM | POA: Diagnosis not present

## 2019-10-06 DIAGNOSIS — M5442 Lumbago with sciatica, left side: Secondary | ICD-10-CM | POA: Diagnosis not present

## 2019-10-06 NOTE — Therapy (Signed)
[facility] [address] [place], [place], [address] Phone: [phone]   Fax:  [phone]  Physical Therapy Treatment  Patient Details  Name: Kristin Saunders MRN: [id number] Date of Birth: February 21, 1991 Referring Provider (PT): Annell Greening   Encounter Date: 10/06/2019  PT End of Session - 10/06/19 1433    Visit Number  2    Number of Visits  6    Date for PT Re-Evaluation  10/22/19    Authorization Type  Medicaid    PT Start Time  1430    PT Stop Time  1516    PT Time Calculation (min)  46 min    Activity Tolerance  Patient limited by pain    Behavior During Therapy  [facility] for tasks assessed/performed       Past Medical History:  Diagnosis Date  . Medical history non-contributory     Past Surgical History:  Procedure Laterality Date  . NO PAST SURGERIES    . TUBAL LIGATION      There were no vitals filed for this visit.  Subjective Assessment - 10/06/19 1432    Subjective  COVID 19 screening performed on patient upon arrival. Patient reports her LBP is pretty bad today. Reports BLE radicular pain when in standing.    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  unlimited    How long can you stand comfortably?  ~5 mins    How long can you walk comfortably?  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    Currently in Pain?  Yes    Pain Score  8     Pain Location  Back    Pain Orientation  Lower    Pain Descriptors / Indicators  Discomfort    Pain Type  Chronic pain    Pain Onset  More than a month ago    Pain Frequency  Constant         [facility] PT Assessment - 10/06/19  0001      Assessment   Medical Diagnosis  L5 Spondylolysis    Referring Provider (PT)  Annell Greening    Next MD Visit  10/19/2019    Prior Therapy  yes      Precautions   Precautions  None      Restrictions   Weight Bearing Restrictions  No                   [facility] Adult PT Treatment/Exercise - 10/06/19 0001      Exercises   Exercises  Lumbar      Lumbar Exercises: Aerobic   Nustep  L3 x15 min      Lumbar Exercises: Supine   Ab Set  20 reps;5 seconds    Clam  20 reps   red theraband   Bent Knee Raise  20 reps    Bridge  Other (comment)   Attempted but stopped due to LBP     Modalities   Modalities  Electrical Stimulation;Moist Heat      Moist Heat Therapy   Number Minutes Moist Heat  15 Minutes    Moist Heat Location  Lumbar Spine  Emergency planning/management officer  B low back    Electrical Stimulation Action  IFC    Electrical Stimulation Parameters  80-150 hz x15 min    Electrical Stimulation Goals  Pain               PT Short Term Goals - 09/24/19 1506      PT SHORT TERM GOAL #1   Title  Patient will be independent with HEP    Baseline  no knowledge of HEP    Time  3    Period  Weeks    Status  New      PT SHORT TERM GOAL #2   Title  Patient will demonstrate proper log rolling bed mobility to protect spine during transitional movements.    Baseline  Supine to long sit transition.    Time  3    Period  Weeks    Status  New      PT SHORT TERM GOAL #3   Title  ---        PT Long Term Goals - 09/24/19 1506      PT LONG TERM GOAL #1   Title  Patient will be independent with advanced HEP    Baseline  no knowledge of exercises    Time  4    Period  Weeks    Status  New      PT LONG TERM GOAL #2   Title  Patient will demonstrate 4+/5 bilateral hip abduction and extension MMT to improve stablity during functional tasks    Baseline  4/5 bilaterally in abduction and extension    Time  4    Period  Weeks     Status  New      PT LONG TERM GOAL #3   Title  Patient will report ability to perform ADLs with low back pain less than or equal to 5/10.    Baseline  at worst 10/10 in low back with activities    Time  4    Period  Weeks    Status  New      PT LONG TERM GOAL #4   Title  Patient will report a centralization of neurological symptoms to bilateral knees or higher to decrease bilateral nerve irritation    Baseline  neurological symptoms of numbness and tingling to bilateral feet.    Time  4    Period  Weeks    Status  New            Plan - 10/06/19 1512    Clinical Impression Statement  Patient presented in clinic with reports of LBP that is exaggerated by activity or standing. Patient guided through light core exercises with reports of 8/10 LBP throughout therex session. Therex session stopped after bridge in which she continued to report increased pain. Normal modalities response noted following removal of the modalities. Patient instructed in proper core activation technique but may required more education in future visits.    Personal Factors and Comorbidities  Age    Examination-Activity Limitations  Bend;Squat;Sleep;Stand;Stairs;Transfers;Dressing    Examination-Participation Restrictions  Cleaning;Meal Prep    Stability/Clinical Decision Making  Stable/Uncomplicated    Rehab Potential  Fair    PT Frequency  2x / week    PT Duration  4 weeks    PT Treatment/Interventions  ADLs/Self Care Home Management;Iontophoresis 4mg /ml Dexamethasone;Moist Heat;Ultrasound;Traction;Electrical Stimulation;Cryotherapy;Gait training;Stair training;Functional mobility training;Therapeutic activities;Therapeutic exercise;Passive range of motion;Dry needling;Neuromuscular re-education;Manual techniques;Taping;Patient/family education;Spinal Manipulations  PT Next Visit Plan  Nustep, core strengthening and stabilization, LE strengthening and stretching, modalities PRN for pain relief    PT Home  Exercise Plan  see patient education section    Consulted and Agree with Plan of Care  Patient       Patient will benefit from skilled therapeutic intervention in order to improve the following deficits and impairments:  Pain, Postural dysfunction, Difficulty walking, Decreased range of motion, Decreased mobility, Decreased activity tolerance, Decreased strength  Visit Diagnosis: Chronic bilateral low back pain with bilateral sciatica  Muscle weakness (generalized)  Abnormal posture     Problem List Patient Active Problem List   Diagnosis Date Noted  . Spondylolisthesis at L5-S1 level 03/11/2019  . Spondylolysis 03/11/2019  . Chronic bilateral low back pain with bilateral sciatica 03/11/2019  . Obesity (BMI 30.0-34.9) 03/19/2018    Standley Brooking, PTA 10/06/2019, 3:19 PM  [facility] [address] [place], [place], [address] Phone: [phone]   Fax:  [phone]  Name: Kristin Saunders MRN: [id number] Date of Birth: 1991-03-15

## 2019-10-09 ENCOUNTER — Ambulatory Visit: Payer: Medicaid Other | Admitting: Physical Therapy

## 2019-10-13 ENCOUNTER — Ambulatory Visit: Payer: Medicaid Other | Admitting: Physical Therapy

## 2019-10-14 ENCOUNTER — Telehealth: Payer: Self-pay | Admitting: *Deleted

## 2019-10-16 ENCOUNTER — Ambulatory Visit: Payer: Medicaid Other | Admitting: Physical Therapy

## 2019-10-16 ENCOUNTER — Other Ambulatory Visit: Payer: Self-pay

## 2019-10-16 DIAGNOSIS — M5442 Lumbago with sciatica, left side: Secondary | ICD-10-CM

## 2019-10-16 DIAGNOSIS — G8929 Other chronic pain: Secondary | ICD-10-CM

## 2019-10-16 DIAGNOSIS — R293 Abnormal posture: Secondary | ICD-10-CM

## 2019-10-16 DIAGNOSIS — M6281 Muscle weakness (generalized): Secondary | ICD-10-CM

## 2019-10-16 DIAGNOSIS — M5441 Lumbago with sciatica, right side: Secondary | ICD-10-CM | POA: Diagnosis not present

## 2019-10-16 NOTE — Therapy (Signed)
[facility] [address] [place], [place], [address] Phone: [phone]   Fax:  [phone]  Physical Therapy Treatment  Patient Details  Name: Kristin Saunders MRN: [id number] Date of Birth: 06-13-1991 Referring Provider (PT): Annell Greening   Encounter Date: 10/16/2019  PT End of Session - 10/16/19 1152    Visit Number  3    Number of Visits  6    Date for PT Re-Evaluation  10/22/19    Authorization Type  Medicaid    PT Start Time  1115    PT Stop Time  1205    PT Time Calculation (min)  50 min    Activity Tolerance  Patient limited by pain    Behavior During Therapy  [facility] for tasks assessed/performed       Past Medical History:  Diagnosis Date  . Medical history non-contributory     Past Surgical History:  Procedure Laterality Date  . NO PAST SURGERIES    . TUBAL LIGATION      There were no vitals filed for this visit.                    [facility] Adult PT Treatment/Exercise - 10/16/19 0001      Exercises   Exercises  Lumbar      Lumbar Exercises: Stretches   Single Knee to Chest Stretch  3 reps;20 seconds      Lumbar Exercises: Aerobic   Nustep  L4 x 10 minutes      Lumbar Exercises: Supine   Bent Knee Raise  20 reps    Bridge  15 reps;3 seconds    Bridge Limitations  with bolster squeezes     Straight Leg Raise  10 reps;3 seconds;Limitations    Straight Leg Raises Limitations  2 sets      Modalities   Modalities  Electrical Stimulation;Moist Heat      Moist Heat Therapy   Number Minutes Moist Heat  15 Minutes    Moist Heat Location  Lumbar Spine      Electrical Stimulation   Electrical Stimulation Location  paraspinals    Electrical Stimulation Action  IFC    Electrical Stimulation Parameters  80-150 Hz x 15 minutes    Electrical Stimulation Goals  Pain      Manual Therapy   Manual Therapy  Soft tissue mobilization    Manual therapy comments  15 minutes    Joint Mobilization  gentle AP  mobs to L2-L4 grade 2-3.     Soft tissue mobilization  IASTM to lumbar paraspinals and upper glutes               PT Short Term Goals - 09/24/19 1506      PT SHORT TERM GOAL #1   Title  Patient will be independent with HEP    Baseline  no knowledge of HEP    Time  3    Period  Weeks    Status  New      PT SHORT TERM GOAL #2   Title  Patient will demonstrate proper log rolling bed mobility to protect spine during transitional movements.    Baseline  Supine to long sit transition.    Time  3    Period  Weeks    Status  New      PT SHORT TERM GOAL #3   Title  ---        PT Long Term Goals - 10/16/19 1157  PT LONG TERM GOAL #1   Title  Patient will be independent with advanced HEP    Baseline  pt reports doing stretches at home    Time  4    Status  On-going      PT LONG TERM GOAL #2   Title  Patient will demonstrate 4+/5 bilateral hip abduction and extension MMT to improve stablity during functional tasks    Baseline  4/5 bilaterally in abduction and extension    Time  4    Period  Weeks    Status  On-going      PT LONG TERM GOAL #3   Title  Patient will report ability to perform ADLs with low back pain less than or equal to 5/10.    Baseline  at worst 10/10 in low back with activities    Time  4    Period  Weeks    Status  On-going      PT LONG TERM GOAL #4   Title  Patient will report a centralization of neurological symptoms to bilateral knees or higher to decrease bilateral nerve irritation    Baseline  neurological symptoms of numbness and tingling to bilateral feet.    Time  4    Period  Weeks    Status  On-going            Plan - 10/16/19 1155    Clinical Impression Statement  Pt arriving to clinic reporting 5/10 low back pain. Pt reporting it can run up her back toward her neck at times. Pt toleraing core strengthening exercises well. IASTM performed to lumbar paraspinals and upper gluteals. Pt reporting increased soreness along SI  joint. 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    Personal Factors and Comorbidities  Age    Examination-Activity Limitations  Bend;Squat;Sleep;Stand;Stairs;Transfers;Dressing    Examination-Participation Restrictions  Cleaning;Meal Prep    Stability/Clinical Decision Making  Stable/Uncomplicated    Rehab Potential  Fair    PT Frequency  2x / week    PT Duration  4 weeks    PT Treatment/Interventions  ADLs/Self Care Home Management;Iontophoresis 4mg /ml Dexamethasone;Moist Heat;Ultrasound;Traction;Electrical Stimulation;Cryotherapy;Gait training;Stair training;Functional mobility training;Therapeutic activities;Therapeutic exercise;Passive range of motion;Dry needling;Neuromuscular re-education;Manual techniques;Taping;Patient/family education;Spinal Manipulations    PT Next Visit Plan  Nustep, core strengthening and stabilization, LE strengthening and stretching, modalities PRN for pain relief    PT Home Exercise Plan  see patient education section    Consulted and Agree with Plan of Care  Patient       Patient will benefit from skilled therapeutic intervention in order to improve the following deficits and impairments:  Pain, Postural dysfunction, Difficulty walking, Decreased range of motion, Decreased mobility, Decreased activity tolerance, Decreased strength  Visit Diagnosis: Chronic bilateral low back pain with bilateral sciatica  Abnormal posture  Muscle weakness (generalized)     Problem List Patient Active Problem List   Diagnosis Date Noted  . Spondylolisthesis at L5-S1 level 03/11/2019  . Spondylolysis 03/11/2019  . Chronic bilateral low back pain with bilateral sciatica 03/11/2019  . Obesity (BMI 30.0-34.9) 03/19/2018    Oretha Caprice, PT 10/16/2019, 12:04 PM  [facility] [place], [place], [address] Phone: [phone]   Fax:  [phone]  Name: Neera Teng MRN: [id number] Date of Birth: 1991-07-24

## 2019-10-17 ENCOUNTER — Other Ambulatory Visit: Payer: Self-pay | Admitting: Physical Medicine and Rehabilitation

## 2019-10-17 DIAGNOSIS — F411 Generalized anxiety disorder: Secondary | ICD-10-CM

## 2019-10-17 MED ORDER — DIAZEPAM 5 MG PO TABS: ORAL_TABLET | ORAL | 0 refills | Status: DC

## 2019-10-17 NOTE — Telephone Encounter (Signed)
Called pt and advised.  

## 2019-10-17 NOTE — Telephone Encounter (Signed)
done

## 2019-10-17 NOTE — Progress Notes (Signed)
Pre-procedure diazepam ordered for pre-operative anxiety.  

## 2019-10-20 ENCOUNTER — Ambulatory Visit: Payer: Medicaid Other | Attending: Orthopaedic Surgery | Admitting: Physical Therapy

## 2019-10-20 ENCOUNTER — Other Ambulatory Visit: Payer: Self-pay

## 2019-10-20 DIAGNOSIS — M6281 Muscle weakness (generalized): Secondary | ICD-10-CM | POA: Diagnosis not present

## 2019-10-20 DIAGNOSIS — R293 Abnormal posture: Secondary | ICD-10-CM

## 2019-10-20 DIAGNOSIS — G8929 Other chronic pain: Secondary | ICD-10-CM | POA: Diagnosis not present

## 2019-10-20 DIAGNOSIS — M5442 Lumbago with sciatica, left side: Secondary | ICD-10-CM | POA: Insufficient documentation

## 2019-10-20 DIAGNOSIS — M5441 Lumbago with sciatica, right side: Secondary | ICD-10-CM | POA: Diagnosis not present

## 2019-10-20 NOTE — Therapy (Signed)
[facility] [place], [place], [address] Phone: [phone]   Fax:  [phone]  Physical Therapy Treatment  Patient Details  Name: Kristin Saunders MRN: [id number] Date of Birth: [birth year] Referring Provider (PT): Rodell Perna   Encounter Date: 10/20/2019  PT End of Session - 10/20/19 1333    Visit Number  4    Number of Visits  6    Date for PT Re-Evaluation  10/22/19    Authorization Type  Medicaid    PT Start Time  1300    PT Stop Time  7001    PT Time Calculation (min)  48 min    Activity Tolerance  Patient tolerated treatment well    Behavior During Therapy  [facility] for tasks assessed/performed       Past Medical History:  Diagnosis Date  . Medical history non-contributory     Past Surgical History:  Procedure Laterality Date  . NO PAST SURGERIES    . TUBAL LIGATION      There were no vitals filed for this visit.  Subjective Assessment - 10/20/19 1327    Subjective  COVID 19 screening performed on patient upon arrival. Reports getting an injection on Thursday. States she feels like she's making progress.    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  unlimited    How long can you stand comfortably?  ~5 mins    How long can you walk comfortably?  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    Currently in Pain?  Yes    Pain Score  5     Pain Location  Back    Pain Orientation  Lower    Pain Descriptors / Indicators  Discomfort    Pain Type  Chronic pain    Pain Onset  More than a month ago    Pain Frequency  Constant         [facility] PT Assessment -  10/20/19 0001      Assessment   Medical Diagnosis  L5 Spondylolysis    Referring Provider (PT)  Rodell Perna    Next MD Visit  10/23/2019    Prior Therapy  yes      Precautions   Precautions  None      Restrictions   Weight Bearing Restrictions  No                   [facility] Adult PT Treatment/Exercise - 10/20/19 0001      Exercises   Exercises  Lumbar      Lumbar Exercises: Aerobic   Nustep  L3 x 15 minutes      Lumbar Exercises: Supine   Ab Set  20 reps;5 seconds    Clam  20 reps;5 seconds    Bent Knee Raise  20 reps;5 seconds    Bridge  Other (comment)   attempted but terminated due to pain   Other Supine Lumbar Exercises  ball squeeze 5" hold x20      Modalities   Modalities  Electrical Stimulation;Moist Heat      Moist Heat Therapy   Number Minutes Moist Heat  15 Minutes  Moist Heat Location  Lumbar Spine      Electrical Stimulation   Electrical Stimulation Location  B low back    Electrical Stimulation Action  IFC    Electrical Stimulation Parameters  80-150 hz x15 mins    Electrical Stimulation Goals  Pain               PT Short Term Goals - 10/20/19 1337      PT SHORT TERM GOAL #1   Title  Patient will be independent with HEP    Baseline  no knowledge of HEP    Time  3    Period  Weeks    Status  Achieved      PT SHORT TERM GOAL #2   Title  Patient will demonstrate proper log rolling bed mobility to protect spine during transitional movements.    Baseline  Supine to long sit transition.    Time  3    Period  Weeks    Status  Partially Met        PT Long Term Goals - 10/16/19 1157      PT LONG TERM GOAL #1   Title  Patient will be independent with advanced HEP    Baseline  pt reports doing stretches at home    Time  4    Status  On-going      PT LONG TERM GOAL #2   Title  Patient will demonstrate 4+/5 bilateral hip abduction and extension MMT to improve stablity during functional tasks    Baseline  4/5 bilaterally in  abduction and extension    Time  4    Period  Weeks    Status  On-going      PT LONG TERM GOAL #3   Title  Patient will report ability to perform ADLs with low back pain less than or equal to 5/10.    Baseline  at worst 10/10 in low back with activities    Time  4    Period  Weeks    Status  On-going      PT LONG TERM GOAL #4   Title  Patient will report a centralization of neurological symptoms to bilateral knees or higher to decrease bilateral nerve irritation    Baseline  neurological symptoms of numbness and tingling to bilateral feet.    Time  4    Period  Weeks    Status  On-going            Plan - 10/20/19 1334    Clinical Impression Statement  Patient responded fairly well to therapy session with minimal complaints of pain with exception of bridging. Patient reported ongoing low back pain with bridges therefore exercise was terminated. STG have been partially met. Patient shows log roll technique intermittently. Patient educated on proper log roll transfer technique to protect spine. No adverse affects upon removal of modalities.    Personal Factors and Comorbidities  Age    Examination-Activity Limitations  Bend;Squat;Sleep;Stand;Stairs;Transfers;Dressing    Examination-Participation Restrictions  Cleaning;Meal Prep    Stability/Clinical Decision Making  Stable/Uncomplicated    Clinical Decision Making  Low    Rehab Potential  Fair    PT Frequency  2x / week    PT Duration  4 weeks    PT Treatment/Interventions  ADLs/Self Care Home Management;Iontophoresis 71m/ml Dexamethasone;Moist Heat;Ultrasound;Traction;Electrical Stimulation;Cryotherapy;Gait training;Stair training;Functional mobility training;Therapeutic activities;Therapeutic exercise;Passive range of motion;Dry needling;Neuromuscular re-education;Manual techniques;Taping;Patient/family education;Spinal Manipulations    PT Next Visit Plan  continue pending MCD  approval; Nustep, core strengthening and  stabilization, LE strengthening and stretching, modalities PRN for pain relief    PT Home Exercise Plan  see patient education section    Consulted and Agree with Plan of Care  Patient       Patient will benefit from skilled therapeutic intervention in order to improve the following deficits and impairments:  Pain, Postural dysfunction, Difficulty walking, Decreased range of motion, Decreased mobility, Decreased activity tolerance, Decreased strength  Visit Diagnosis: Chronic bilateral low back pain with bilateral sciatica  Abnormal posture  Muscle weakness (generalized)     Problem List Patient Active Problem List   Diagnosis Date Noted  . Spondylolisthesis at L5-S1 level 03/11/2019  . Spondylolysis 03/11/2019  . Chronic bilateral low back pain with bilateral sciatica 03/11/2019  . Obesity (BMI 30.0-34.9) 03/19/2018    Gabriela Eves, PT, DPT 10/20/2019, 1:53 PM  [facility] [address] [place], [place], [address] Phone: [phone]   Fax:  [phone]  Name: Yazleen Molock MRN: [id number] Date of Birth: 13-Jun-1991

## 2019-10-23 ENCOUNTER — Other Ambulatory Visit: Payer: Self-pay

## 2019-10-23 ENCOUNTER — Ambulatory Visit (INDEPENDENT_AMBULATORY_CARE_PROVIDER_SITE_OTHER): Payer: Medicaid Other | Admitting: Physical Medicine and Rehabilitation

## 2019-10-23 ENCOUNTER — Encounter: Payer: Self-pay | Admitting: Physical Medicine and Rehabilitation

## 2019-10-23 ENCOUNTER — Ambulatory Visit: Payer: Self-pay

## 2019-10-23 VITALS — BP 104/72 | HR 87

## 2019-10-23 DIAGNOSIS — M4316 Spondylolisthesis, lumbar region: Secondary | ICD-10-CM | POA: Diagnosis not present

## 2019-10-23 DIAGNOSIS — Q762 Congenital spondylolisthesis: Secondary | ICD-10-CM

## 2019-10-23 DIAGNOSIS — M48061 Spinal stenosis, lumbar region without neurogenic claudication: Secondary | ICD-10-CM

## 2019-10-23 DIAGNOSIS — M5416 Radiculopathy, lumbar region: Secondary | ICD-10-CM

## 2019-10-23 MED ORDER — METHYLPREDNISOLONE ACETATE 80 MG/ML IJ SUSP
40.0000 mg | Freq: Once | INTRAMUSCULAR | Status: AC
  Administered 2019-10-23: 40 mg

## 2019-10-23 NOTE — Progress Notes (Signed)
Pt states pain in the lower back and radiates into both legs all the way down. Pt states pain started over a week ago. Moving around and bending makes pain worse. Stretches and in home therapy exercises helps with pain.   .Numeric Pain Rating Scale and Functional Assessment Average Pain 7   In the last MONTH (on 0-10 scale) has pain interfered with the following?  1. General activity like being  able to carry out your everyday physical activities such as walking, climbing stairs, carrying groceries, or moving a chair?  Rating(7)   +Driver, -BT, -Dye Allergies.

## 2019-10-30 ENCOUNTER — Other Ambulatory Visit: Payer: Self-pay

## 2019-10-30 ENCOUNTER — Ambulatory Visit: Payer: Medicaid Other | Admitting: Physical Therapy

## 2019-10-30 DIAGNOSIS — M5442 Lumbago with sciatica, left side: Secondary | ICD-10-CM | POA: Diagnosis not present

## 2019-10-30 DIAGNOSIS — M5441 Lumbago with sciatica, right side: Secondary | ICD-10-CM

## 2019-10-30 DIAGNOSIS — G8929 Other chronic pain: Secondary | ICD-10-CM

## 2019-10-30 DIAGNOSIS — R293 Abnormal posture: Secondary | ICD-10-CM | POA: Diagnosis not present

## 2019-10-30 DIAGNOSIS — M6281 Muscle weakness (generalized): Secondary | ICD-10-CM | POA: Diagnosis not present

## 2019-10-30 NOTE — Therapy (Signed)
[facility] [place], [place], [address] Phone: [phone]   Fax:  [phone]  Physical Therapy Treatment  Patient Details  Name: Kristin Saunders MRN: [id number] Date of Birth: 01-Dec-1990 Referring Provider (PT): Rodell Perna   Encounter Date: 10/30/2019  PT End of Session - 10/30/19 1504    Visit Number  5    Number of Visits  6    Date for PT Re-Evaluation  10/22/19    Authorization Type  Medicaid    PT Start Time  1430    PT Stop Time  1516    PT Time Calculation (min)  46 min    Activity Tolerance  Patient tolerated treatment well    Behavior During Therapy  [facility] for tasks assessed/performed       Past Medical History:  Diagnosis Date  . Medical history non-contributory     Past Surgical History:  Procedure Laterality Date  . NO PAST SURGERIES    . TUBAL LIGATION      There were no vitals filed for this visit.  Subjective Assessment - 10/30/19 1503    Subjective  COVID 19 screening performed on patient upon arrival. 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  unlimited    How long can you stand comfortably?  ~5 mins    How long can you walk comfortably?  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    Currently in Pain?  Yes    Pain Score  8     Pain Location  Back    Pain Orientation  Lower    Pain Descriptors / Indicators  Discomfort    Pain Type  Chronic pain    Pain Onset  More than a month ago    Pain Frequency  Constant          [facility] PT Assessment - 10/30/19 0001      Assessment   Medical Diagnosis  L5 Spondylolysis    Referring Provider (PT)  Rodell Perna    Next MD Visit  10/23/2019    Prior Therapy  yes      Precautions   Precautions  None      Restrictions   Weight Bearing Restrictions  No                   [facility] Adult PT Treatment/Exercise - 10/30/19 0001      Exercises   Exercises  Lumbar      Lumbar Exercises: Aerobic   Nustep  L3 x 15 minutes      Modalities   Modalities  Electrical Stimulation;Ultrasound;Moist Heat      Moist Heat Therapy   Number Minutes Moist Heat  15 Minutes    Moist Heat Location  Lumbar Spine      Electrical Stimulation   Electrical Stimulation Location  B low back    Electrical Stimulation Action  IFC    Electrical Stimulation Parameters  80-150 hz x15 mins    Electrical Stimulation Goals  Pain  Ultrasound   Ultrasound Location  bilateral lumbar paraspinals    Ultrasound Parameters  1.5 w/cm2 50% 1 mhz x8    Ultrasound Goals  Pain               PT Short Term Goals - 10/20/19 1337      PT SHORT TERM GOAL #1   Title  Patient will be independent with HEP    Baseline  no knowledge of HEP    Time  3    Period  Weeks    Status  Achieved      PT SHORT TERM GOAL #2   Title  Patient will demonstrate proper log rolling bed mobility to protect spine during transitional movements.    Baseline  Supine to long sit transition.    Time  3    Period  Weeks    Status  Partially Met        PT Long Term Goals - 10/16/19 1157      PT LONG TERM GOAL #1   Title  Patient will be independent with advanced HEP    Baseline  pt reports doing stretches at home    Time  4    Status  On-going      PT LONG TERM GOAL #2   Title  Patient will demonstrate 4+/5 bilateral hip abduction and extension MMT to improve stablity during functional tasks    Baseline  4/5 bilaterally in abduction and extension    Time  4    Period  Weeks    Status   On-going      PT LONG TERM GOAL #3   Title  Patient will report ability to perform ADLs with low back pain less than or equal to 5/10.    Baseline  at worst 10/10 in low back with activities    Time  4    Period  Weeks    Status  On-going      PT LONG TERM GOAL #4   Title  Patient will report a centralization of neurological symptoms to bilateral knees or higher to decrease bilateral nerve irritation    Baseline  neurological symptoms of numbness and tingling to bilateral feet.    Time  4    Period  Weeks    Status  On-going            Plan - 10/30/19 1505    Clinical Impression Statement  Patient was able to tolerate treatment fairly. Patient denied any increase of pain in the low back during nustep. Conservative treatment performed due to increase of pain and visible grimmacing during ambulation secondary to pain. No adverse affects upon removal of modalites.    Personal Factors and Comorbidities  Age    Examination-Activity Limitations  Bend;Squat;Sleep;Stand;Stairs;Transfers;Dressing    Examination-Participation Restrictions  Cleaning;Meal Prep    Stability/Clinical Decision Making  Stable/Uncomplicated    Clinical Decision Making  Low    Rehab Potential  Fair    PT Frequency  2x / week    PT Duration  4 weeks    PT Treatment/Interventions  ADLs/Self Care Home Management;Iontophoresis 25m/ml Dexamethasone;Moist Heat;Ultrasound;Traction;Electrical Stimulation;Cryotherapy;Gait training;Stair training;Functional mobility training;Therapeutic activities;Therapeutic exercise;Passive range of motion;Dry needling;Neuromuscular re-education;Manual techniques;Taping;Patient/family education;Spinal Manipulations    PT Next Visit Plan  continue pending MCD approval; Nustep, core strengthening and stabilization, LE strengthening and stretching, modalities PRN for pain relief    PT Home Exercise Plan  see patient education section    Consulted and Agree with Plan of  Care  Patient        Patient will benefit from skilled therapeutic intervention in order to improve the following deficits and impairments:  Pain, Postural dysfunction, Difficulty walking, Decreased range of motion, Decreased mobility, Decreased activity tolerance, Decreased strength  Visit Diagnosis: Chronic bilateral low back pain with bilateral sciatica  Abnormal posture  Muscle weakness (generalized)     Problem List Patient Active Problem List   Diagnosis Date Noted  . Spondylolisthesis at L5-S1 level 03/11/2019  . Spondylolysis 03/11/2019  . Chronic bilateral low back pain with bilateral sciatica 03/11/2019  . Obesity (BMI 30.0-34.9) 03/19/2018    Gabriela Eves, PT, DPT  10/30/2019, 3:21 PM  [facility] [address] [place], [place], [address] Phone: [phone]   Fax:  [phone]  Name: Kristin Saunders MRN: [id number] Date of Birth: 1991-06-26

## 2019-10-31 ENCOUNTER — Ambulatory Visit: Payer: Medicaid Other | Admitting: Physical Therapy

## 2019-11-04 ENCOUNTER — Ambulatory Visit: Payer: Medicaid Other | Admitting: Physical Therapy

## 2019-11-05 DIAGNOSIS — J3489 Other specified disorders of nose and nasal sinuses: Secondary | ICD-10-CM | POA: Diagnosis not present

## 2019-11-05 DIAGNOSIS — R0602 Shortness of breath: Secondary | ICD-10-CM | POA: Diagnosis not present

## 2019-11-06 ENCOUNTER — Ambulatory Visit: Payer: Medicaid Other | Admitting: Physical Therapy

## 2019-11-11 ENCOUNTER — Ambulatory Visit: Payer: Medicaid Other | Admitting: Orthopaedic Surgery

## 2019-11-11 DIAGNOSIS — R58 Hemorrhage, not elsewhere classified: Secondary | ICD-10-CM | POA: Diagnosis not present

## 2019-11-11 DIAGNOSIS — N920 Excessive and frequent menstruation with regular cycle: Secondary | ICD-10-CM | POA: Diagnosis not present

## 2019-11-11 DIAGNOSIS — Z9851 Tubal ligation status: Secondary | ICD-10-CM | POA: Diagnosis not present

## 2019-11-11 DIAGNOSIS — B9689 Other specified bacterial agents as the cause of diseases classified elsewhere: Secondary | ICD-10-CM | POA: Diagnosis not present

## 2019-11-11 DIAGNOSIS — N760 Acute vaginitis: Secondary | ICD-10-CM | POA: Diagnosis not present

## 2019-11-11 DIAGNOSIS — R1084 Generalized abdominal pain: Secondary | ICD-10-CM | POA: Diagnosis not present

## 2019-11-13 ENCOUNTER — Encounter: Payer: Self-pay | Admitting: Physical Therapy

## 2019-11-13 ENCOUNTER — Other Ambulatory Visit: Payer: Self-pay

## 2019-11-13 ENCOUNTER — Ambulatory Visit: Payer: Medicaid Other | Admitting: Physical Therapy

## 2019-11-13 DIAGNOSIS — G8929 Other chronic pain: Secondary | ICD-10-CM

## 2019-11-13 DIAGNOSIS — M5442 Lumbago with sciatica, left side: Secondary | ICD-10-CM | POA: Diagnosis not present

## 2019-11-13 DIAGNOSIS — R293 Abnormal posture: Secondary | ICD-10-CM | POA: Diagnosis not present

## 2019-11-13 DIAGNOSIS — M5441 Lumbago with sciatica, right side: Secondary | ICD-10-CM | POA: Diagnosis not present

## 2019-11-13 DIAGNOSIS — M6281 Muscle weakness (generalized): Secondary | ICD-10-CM | POA: Diagnosis not present

## 2019-11-13 NOTE — Therapy (Signed)
[facility] [place], [place], [address] Phone: [phone]   Fax:  [phone]  Physical Therapy Treatment  Patient Details  Name: Kristin Saunders MRN: [id number] Date of Birth: Sep 23, 1990 Referring Provider (PT): Rodell Perna   Encounter Date: 11/13/2019  PT End of Session - 11/13/19 1446    Visit Number  6    Number of Visits  6    Date for PT Re-Evaluation  10/22/19    Authorization Type  Medicaid    PT Start Time  1432    PT Stop Time  1517    PT Time Calculation (min)  45 min    Activity Tolerance  Patient tolerated treatment well    Behavior During Therapy  [facility] for tasks assessed/performed       Past Medical History:  Diagnosis Date  . Medical history non-contributory     Past Surgical History:  Procedure Laterality Date  . NO PAST SURGERIES    . TUBAL LIGATION      There were no vitals filed for this visit.  Subjective Assessment - 11/13/19 1443    Subjective  COVID 19 screening performed on patient upon arrival. Patient reports 0/10 pain prior to PT session and rise to 4/10 with Nustep warm up. Patient reports that radicular pain is not as intense as it was before.    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  unlimited    How long can you stand comfortably?  approximately 10 minutes    How long can you walk comfortably?  approximately 10 minutes    Diagnostic tests  MRI: Bilateral pars defects at L5 with 5 mm spondylolisthesis and abroad-based soft disc protrusion which compresses both L5 nerves inthe neural foramina.    Currently in Pain?  Yes    Pain Score  4     Pain Location  Back    Pain Orientation  Lower    Pain Descriptors / Indicators  Discomfort    Pain Type  Chronic pain    Pain Radiating Towards  B feet    Pain Onset  More than a month ago    Pain Frequency  Constant         [facility] PT Assessment - 11/13/19 0001      Assessment   Medical Diagnosis  L5 Spondylolysis    Referring Provider (PT)  Rodell Perna    Next MD Visit  10/23/2019    Prior Therapy  yes      Precautions   Precautions  None      Restrictions   Weight Bearing Restrictions  No                   [facility] Adult PT Treatment/Exercise - 11/13/19 0001      Lumbar Exercises: Aerobic   Nustep  L4 x15 min      Lumbar Exercises: Standing   Forward Lunge  10 reps;2 seconds   core activation with 4# reachouts    Scapular Retraction  Strengthening;Both;20 reps;Limitations    Scapular Retraction Limitations  green XTS and core activation    Shoulder Extension  Strengthening;Both;20 reps;Limitations    Shoulder Extension Limitations  green XTS with core activation; multimodal cueing for correct posture      Lumbar Exercises: Supine  Dead Bug  10 reps   with 2# cross over   [place] with Ball Squeeze  20 reps;2 seconds   reported short duration jab rated at 5/10 in L low back     Modalities   Modalities  Electrical Stimulation;Moist Heat      Moist Heat Therapy   Number Minutes Moist Heat  15 Minutes    Moist Heat Location  Lumbar Spine      Electrical Stimulation   Electrical Stimulation Location  B low back    Electrical Stimulation Action  Pre-Mod    Electrical Stimulation Parameters  80-150 hz x15 min    Electrical Stimulation Goals  Pain               PT Short Term Goals - 10/20/19 1337      PT SHORT TERM GOAL #1   Title  Patient will be independent with HEP    Baseline  no knowledge of HEP    Time  3    Period  Weeks    Status  Achieved      PT SHORT TERM GOAL #2   Title  Patient will demonstrate proper log rolling bed mobility to protect spine during transitional movements.    Baseline  Supine to long sit transition.    Time  3    Period  Weeks     Status  Partially Met        PT Long Term Goals - 10/16/19 1157      PT LONG TERM GOAL #1   Title  Patient will be independent with advanced HEP    Baseline  pt reports doing stretches at home    Time  4    Status  On-going      PT LONG TERM GOAL #2   Title  Patient will demonstrate 4+/5 bilateral hip abduction and extension MMT to improve stablity during functional tasks    Baseline  4/5 bilaterally in abduction and extension    Time  4    Period  Weeks    Status  On-going      PT LONG TERM GOAL #3   Title  Patient will report ability to perform ADLs with low back pain less than or equal to 5/10.    Baseline  at worst 10/10 in low back with activities    Time  4    Period  Weeks    Status  On-going      PT LONG TERM GOAL #4   Title  Patient will report a centralization of neurological symptoms to bilateral knees or higher to decrease bilateral nerve irritation    Baseline  neurological symptoms of numbness and tingling to bilateral feet.    Time  4    Period  Weeks    Status  On-going            Plan - 11/13/19 1506    Clinical Impression Statement  Patient presented in clinic with reports of continued LBP although she has had a better day prior to PT at 0/10 pain. Patient continues to experience BLE radicular symptoms to feet level but less intensity reported overall now. Patient continually instructed in core activation throughout therex session and progressed to more functional activities. By the end of therex session patient reported 6/10 LBP. Normal modalities response noted following removal of the modalities. Patient reminded to utilize log rolling for bed mobility to avoid increased pain and encouraged to continue HEP.    Personal  Factors and Comorbidities  Age    Examination-Activity Limitations  Bend;Squat;Sleep;Stand;Stairs;Transfers;Dressing    Examination-Participation Restrictions  Cleaning;Meal Prep    Stability/Clinical Decision Making   Stable/Uncomplicated    Rehab Potential  Fair    PT Frequency  2x / week    PT Duration  4 weeks    PT Treatment/Interventions  ADLs/Self Care Home Management;Iontophoresis 17m/ml Dexamethasone;Moist Heat;Ultrasound;Traction;Electrical Stimulation;Cryotherapy;Gait training;Stair training;Functional mobility training;Therapeutic activities;Therapeutic exercise;Passive range of motion;Dry needling;Neuromuscular re-education;Manual techniques;Taping;Patient/family education;Spinal Manipulations    PT Next Visit Plan  continue pending MCD approval; Nustep, core strengthening and stabilization, LE strengthening and stretching, modalities PRN for pain relief    PT Home Exercise Plan  see patient education section    Consulted and Agree with Plan of Care  Patient       Patient will benefit from skilled therapeutic intervention in order to improve the following deficits and impairments:  Pain, Postural dysfunction, Difficulty walking, Decreased range of motion, Decreased mobility, Decreased activity tolerance, Decreased strength  Visit Diagnosis: Chronic bilateral low back pain with bilateral sciatica  Abnormal posture  Muscle weakness (generalized)     Problem List Patient Active Problem List   Diagnosis Date Noted  . Spondylolisthesis at L5-S1 level 03/11/2019  . Spondylolysis 03/11/2019  . Chronic bilateral low back pain with bilateral sciatica 03/11/2019  . Obesity (BMI 30.0-34.9) 03/19/2018    KStandley Brooking PTA 11/13/2019, 3:22 PM  [facility] [address] [address]Phone: [phone]  Fax:  [phone] Name: JArleth MccullarMRN: [id number] of Birth: [birth year]

## 2019-11-18 ENCOUNTER — Ambulatory Visit: Payer: Medicaid Other | Admitting: Orthopaedic Surgery

## 2019-11-19 ENCOUNTER — Encounter: Payer: Self-pay | Admitting: Family Medicine

## 2019-11-19 ENCOUNTER — Encounter: Payer: Medicaid Other | Admitting: Family Medicine

## 2019-11-19 NOTE — Progress Notes (Signed)
When patient answered phone she said she needed to reschedule.

## 2019-11-20 ENCOUNTER — Encounter: Payer: Self-pay | Admitting: Physical Therapy

## 2019-11-20 ENCOUNTER — Other Ambulatory Visit: Payer: Self-pay

## 2019-11-20 ENCOUNTER — Ambulatory Visit: Payer: Medicaid Other | Attending: Orthopaedic Surgery | Admitting: Physical Therapy

## 2019-11-20 DIAGNOSIS — M6281 Muscle weakness (generalized): Secondary | ICD-10-CM | POA: Insufficient documentation

## 2019-11-20 DIAGNOSIS — R293 Abnormal posture: Secondary | ICD-10-CM | POA: Insufficient documentation

## 2019-11-20 DIAGNOSIS — G8929 Other chronic pain: Secondary | ICD-10-CM | POA: Insufficient documentation

## 2019-11-20 DIAGNOSIS — M5441 Lumbago with sciatica, right side: Secondary | ICD-10-CM | POA: Insufficient documentation

## 2019-11-20 DIAGNOSIS — M5442 Lumbago with sciatica, left side: Secondary | ICD-10-CM | POA: Diagnosis not present

## 2019-11-20 NOTE — Therapy (Signed)
[facility] [place], [place], [address] Phone: [phone]   Fax:  [phone]  Physical Therapy Treatment  Patient Details  Name: Kristin Saunders MRN: [id number] Date of Birth: 1990-10-24 Referring Provider (PT): Rodell Perna   Encounter Date: 11/20/2019  PT End of Session - 11/20/19 1334    Visit Number  7    Number of Visits  10    Date for PT Re-Evaluation  11/23/19    PT Start Time  0100    PT Stop Time  0148    PT Time Calculation (min)  48 min    Activity Tolerance  Patient tolerated treatment well    Behavior During Therapy  [facility] for tasks assessed/performed       Past Medical History:  Diagnosis Date  . Medical history non-contributory     Past Surgical History:  Procedure Laterality Date  . NO PAST SURGERIES    . TUBAL LIGATION      There were no vitals filed for this visit.  Subjective Assessment - 11/20/19 1303    Subjective  COVID 19 screening performed on patient upon arrival. Patient reported some dicomfort in neck today for unknown reason    Pertinent History  Bilateral pars defects at L5 with 5 mm spondylolisthesis and abroad-based soft disc protrusion which compresses both L5 nerves inthe neural foramina    Limitations  Walking;House hold activities;Standing    How long can you sit comfortably?  unlimited    How long can you stand comfortably?  approximately 10 minutes    How long can you walk comfortably?  approximately 10 minutes    Diagnostic tests  MRI: Bilateral pars defects at L5 with 5 mm spondylolisthesis and abroad-based soft disc protrusion which compresses both L5 nerves inthe neural foramina.    Currently in Pain?  Yes    Pain Location  Back   neck more than back 6/10 reported   Pain Descriptors / Indicators  Discomfort    Pain Type  Chronic pain    Pain Onset  More than a month ago    Pain Frequency  Intermittent    Aggravating Factors   increased activity    Pain Relieving  Factors  at rest                       [facility] Adult PT Treatment/Exercise - 11/20/19 0001      Transfers   Comments  performed logroll x2 with good technique      Lumbar Exercises: Aerobic   Nustep  L4 x15 min      Lumbar Exercises: Standing   Scapular Retraction  Strengthening;Both;20 reps;Limitations    Scapular Retraction Limitations  green tband    Shoulder Extension  Strengthening;Both;20 reps;Limitations    Shoulder Extension Limitations  green tband      Lumbar Exercises: Supine   Bent Knee Raise  3 seconds   2x10   Bridge with clamshell  20 reps;3 seconds   red t-band   Straight Leg Raise  3 seconds   2x10     Moist Heat Therapy   Number Minutes Moist Heat  15 Minutes    Moist Heat Location  Lumbar Spine      Electrical Stimulation   Electrical Stimulation Location  B low back    Electrical Stimulation Action  premod    Electrical Stimulation Parameters  80-'150hz'  x58mn    Electrical Stimulation Goals  Pain  PT Short Term Goals - 11/20/19 1310      PT SHORT TERM GOAL #1   Title  Patient will be independent with HEP    Baseline  no knowledge of HEP    Time  3    Period  Weeks    Status  Achieved      PT SHORT TERM GOAL #2   Title  Patient will demonstrate proper log rolling bed mobility to protect spine during transitional movements.    Baseline  Supine to long sit transition.    Time  3    Period  Weeks    Status  Achieved   good technique 11/20/19       PT Long Term Goals - 11/20/19 1310      PT LONG TERM GOAL #1   Title  Patient will be independent with advanced HEP    Baseline  pt reports doing stretches at home    Time  4    Period  Weeks    Status  On-going      PT LONG TERM GOAL #2   Title  Patient will demonstrate 4+/5 bilateral hip abduction and extension MMT to improve stablity during functional tasks    Baseline  4/5 bilaterally in abduction and extension    Period  Weeks    Status  On-going       PT LONG TERM GOAL #3   Title  Patient will report ability to perform ADLs with low back pain less than or equal to 5/10.    Baseline  at worst 10/10 in low back with activities    Time  4    Period  Weeks    Status  On-going   up to 10/10some days per reported 11/20/19     PT LONG TERM GOAL #4   Title  Patient will report a centralization of neurological symptoms to bilateral knees or higher to decrease bilateral nerve irritation    Baseline  neurological symptoms of numbness and tingling to bilateral feet.    Time  4    Period  Weeks    Status  On-going   improved yet ongoing per reported 11/20/19           Plan - 11/20/19 1331    Clinical Impression Statement  Patient tolerated treatment well today. Patient able to perform logroll x 2 with good technique today. Patient reported mild soreness with exercises today. Patient has reported less symptoms in LE's yet ongoing. Patient unable to perorm all ADL's at this time due to 10/10 pain in low back. Patient met [facility] tody with LTG's ongoing.    Personal Factors and Comorbidities  Age    Examination-Activity Limitations  Bend;Squat;Sleep;Stand;Stairs;Transfers;Dressing    Examination-Participation Restrictions  Cleaning;Meal Prep    Stability/Clinical Decision Making  Stable/Uncomplicated    Rehab Potential  Fair    PT Frequency  2x / week    PT Duration  4 weeks    PT Treatment/Interventions  ADLs/Self Care Home Management;Iontophoresis 35m/ml Dexamethasone;Moist Heat;Ultrasound;Traction;Electrical Stimulation;Cryotherapy;Gait training;Stair training;Functional mobility training;Therapeutic activities;Therapeutic exercise;Passive range of motion;Dry needling;Neuromuscular re-education;Manual techniques;Taping;Patient/family education;Spinal Manipulations    PT Next Visit Plan  continue POC Nustep, core strengthening and stabilization, LE strengthening and stretching, modalities PRN for pain relief    Consulted and Agree with Plan of Care   Patient       Patient will benefit from skilled therapeutic intervention in order to improve the following deficits and impairments:  Pain, Postural dysfunction, Difficulty walking, Decreased  range of motion, Decreased mobility, Decreased activity tolerance, Decreased strength  Visit Diagnosis: Chronic bilateral low back pain with bilateral sciatica  Abnormal posture  Muscle weakness (generalized)     Problem List Patient Active Problem List   Diagnosis Date Noted  . Spondylolisthesis at L5-S1 level 03/11/2019  . Spondylolysis 03/11/2019  . Chronic bilateral low back pain with bilateral sciatica 03/11/2019  . Obesity (BMI 30.0-34.9) 03/19/2018    Durga Saldarriaga P, PTA 11/20/2019, 1:48 PM  [facility] [address] [place], [place], [address] Phone: [phone]   Fax:  [phone]  Name: Leta Bucklin MRN: [id number] Date of Birth: 12-20-1990

## 2019-11-21 NOTE — Progress Notes (Signed)
Kristin Saunders - 29 y.o. female MRN [id number]  Date of birth: 1990-08-26  Office Visit Note: Visit Date: 10/23/2019 PCP: Junie Spencer, FNP Referred by: Junie Spencer, FNP  Subjective: Chief Complaint  Patient presents with  . Lower Back - Pain  . Right Leg - Pain  . Left Leg - Pain   HPI:  Kristin Saunders is a 29 y.o. female who comes in today For planned bile L5 transforaminal epidural steroid injection at the request of Dr. Annell Greening.  Patient is having low back and bilateral radicular leg pain with failure of conservative care.  MRI reviewed.  ROS Otherwise per HPI.  Assessment & Plan: Visit Diagnoses:  1. Lumbar radiculopathy   2. Spondylolisthesis of lumbar region   3. Foraminal stenosis of lumbar region   4. Congenital spondylolysis     Plan: No additional findings.   Meds & Orders:  Meds ordered this encounter  Medications  . methylPREDNISolone acetate (DEPO-MEDROL) injection 40 mg    Orders Placed This Encounter  Procedures  . XR C-ARM NO REPORT  . Epidural Steroid injection    Follow-up: Return for Annell Greening, MD 11/11/2019.   Procedures: No procedures performed  Lumbosacral Transforaminal Epidural Steroid Injection - Sub-Pedicular Approach with Fluoroscopic Guidance  Patient: Kristin Saunders      Date of Birth: 08/10/1991 MRN: [id number] PCP: Junie Spencer, FNP      Visit Date: 10/23/2019   Universal Protocol:    Date/Time: 10/23/2019  Consent Given By: the patient  Position: PRONE  Additional Comments: Vital signs were monitored before and after the procedure. Patient was prepped and draped in the usual sterile fashion. The correct patient, procedure, and site was verified.   Injection Procedure Details:  Procedure Site One Meds Administered:  Meds ordered this encounter  Medications  . methylPREDNISolone acetate (DEPO-MEDROL) injection 40 mg    Laterality: Bilateral  Location/Site:  L5-S1  Needle size: 22 G  Needle  type: Spinal  Needle Placement: Transforaminal  Findings:    -Comments: Excellent flow of contrast along the nerve and into the epidural space.  Procedure Details: After squaring off the end-plates to get a true AP view, the C-arm was positioned so that an oblique view of the foramen as noted above was visualized. The target area is just inferior to the "nose of the scotty dog" or sub pedicular. The soft tissues overlying this structure were infiltrated with 2-3 ml. of 1% Lidocaine without Epinephrine.  The spinal needle was inserted toward the target using a "trajectory" view along the fluoroscope beam.  Under AP and lateral visualization, the needle was advanced so it did not puncture dura and was located close the 6 O'Clock position of the pedical in AP tracterory. Biplanar projections were used to confirm position. Aspiration was confirmed to be negative for CSF and/or blood. A 1-2 ml. volume of Isovue-250 was injected and flow of contrast was noted at each level. Radiographs were obtained for documentation purposes.   After attaining the desired flow of contrast documented above, a 0.5 to 1.0 ml test dose of 0.25% Marcaine was injected into each respective transforaminal space.  The patient was observed for 90 seconds post injection.  After no sensory deficits were reported, and normal lower extremity motor function was noted,   the above injectate was administered so that equal amounts of the injectate were placed at each foramen (level) into the transforaminal epidural space.   Additional Comments:  The patient tolerated the procedure well  Dressing: 2 x 2 sterile gauze and Band-Aid    Post-procedure details: Patient was observed during the procedure. Post-procedure instructions were reviewed.  Patient left the clinic in stable condition.     Clinical History: MRI LUMBAR SPINE WITHOUT CONTRAST  TECHNIQUE: Multiplanar, multisequence MR imaging of the lumbar spine  was performed. No intravenous contrast was administered.  COMPARISON:  Lumbar radiographs dated 03/11/2019  FINDINGS: Segmentation:  Standard.  Alignment: 5 mm spondylolisthesis of L5 on S1 due to bilateral pars defects. Otherwise normal.  Vertebrae:  Bilateral pars defects at L5. Otherwise normal.  Conus medullaris and cauda equina: Conus extends to the L1-2 level. Conus and cauda equina appear normal.  Paraspinal and other soft tissues: Negative.  Disc levels:  T11-12 through L4-5: The discs are normal. No foraminal or spinal stenosis. No facet arthritis.  L5-S1: Broad-based soft disc protrusion slightly asymmetric to the right extending into both neural foramina. 5 mm spondylolisthesis with bilateral pars defects. Both L5 nerves are compressed in the neural foramina best demonstrated on images 3, 9 and 10 of series 8. No impingement upon the thecal sac.  IMPRESSION: 1. Bilateral pars defects at L5 with 5 mm spondylolisthesis and a broad-based soft disc protrusion which compresses both L5 nerves in the neural foramina. 2. Otherwise, normal exam.   Electronically Signed   By: Lorriane Shire M.D.   On: 09/11/2019 16:06     Objective:  VS:  HT:    WT:   BMI:     BP:104/72  HR:87bpm  TEMP: ( )  RESP:  Physical Exam  Ortho Exam Imaging: No results found.

## 2019-11-21 NOTE — Procedures (Signed)
Lumbosacral Transforaminal Epidural Steroid Injection - Sub-Pedicular Approach with Fluoroscopic Guidance  Patient: Kristin Saunders      Date of Birth: 07/09/1991 MRN: [id number] PCP: Junie Spencer, FNP      Visit Date: 10/23/2019   Universal Protocol:    Date/Time: 10/23/2019  Consent Given By: the patient  Position: PRONE  Additional Comments: Vital signs were monitored before and after the procedure. Patient was prepped and draped in the usual sterile fashion. The correct patient, procedure, and site was verified.   Injection Procedure Details:  Procedure Site One Meds Administered:  Meds ordered this encounter  Medications  . methylPREDNISolone acetate (DEPO-MEDROL) injection 40 mg    Laterality: Bilateral  Location/Site:  L5-S1  Needle size: 22 G  Needle type: Spinal  Needle Placement: Transforaminal  Findings:    -Comments: Excellent flow of contrast along the nerve and into the epidural space.  Procedure Details: After squaring off the end-plates to get a true AP view, the C-arm was positioned so that an oblique view of the foramen as noted above was visualized. The target area is just inferior to the "nose of the scotty dog" or sub pedicular. The soft tissues overlying this structure were infiltrated with 2-3 ml. of 1% Lidocaine without Epinephrine.  The spinal needle was inserted toward the target using a "trajectory" view along the fluoroscope beam.  Under AP and lateral visualization, the needle was advanced so it did not puncture dura and was located close the 6 O'Clock position of the pedical in AP tracterory. Biplanar projections were used to confirm position. Aspiration was confirmed to be negative for CSF and/or blood. A 1-2 ml. volume of Isovue-250 was injected and flow of contrast was noted at each level. Radiographs were obtained for documentation purposes.   After attaining the desired flow of contrast documented above, a 0.5 to 1.0 ml test dose of  0.25% Marcaine was injected into each respective transforaminal space.  The patient was observed for 90 seconds post injection.  After no sensory deficits were reported, and normal lower extremity motor function was noted,   the above injectate was administered so that equal amounts of the injectate were placed at each foramen (level) into the transforaminal epidural space.   Additional Comments:  The patient tolerated the procedure well Dressing: 2 x 2 sterile gauze and Band-Aid    Post-procedure details: Patient was observed during the procedure. Post-procedure instructions were reviewed.  Patient left the clinic in stable condition.

## 2019-11-28 ENCOUNTER — Ambulatory Visit: Payer: Medicaid Other | Admitting: Orthopaedic Surgery

## 2019-12-05 ENCOUNTER — Ambulatory Visit: Payer: Medicaid Other | Admitting: Orthopaedic Surgery

## 2019-12-08 ENCOUNTER — Ambulatory Visit: Payer: Medicaid Other | Admitting: Orthopaedic Surgery

## 2019-12-08 ENCOUNTER — Other Ambulatory Visit: Payer: Self-pay

## 2019-12-08 ENCOUNTER — Encounter: Payer: Self-pay | Admitting: Orthopaedic Surgery

## 2019-12-08 VITALS — BP 104/75 | HR 87 | Ht 66.0 in | Wt 200.0 lb

## 2019-12-08 DIAGNOSIS — M4317 Spondylolisthesis, lumbosacral region: Secondary | ICD-10-CM

## 2019-12-08 DIAGNOSIS — M5441 Lumbago with sciatica, right side: Secondary | ICD-10-CM | POA: Diagnosis not present

## 2019-12-08 DIAGNOSIS — G8929 Other chronic pain: Secondary | ICD-10-CM

## 2019-12-08 DIAGNOSIS — M5442 Lumbago with sciatica, left side: Secondary | ICD-10-CM | POA: Diagnosis not present

## 2019-12-08 DIAGNOSIS — M4300 Spondylolysis, site unspecified: Secondary | ICD-10-CM | POA: Diagnosis not present

## 2019-12-08 NOTE — Progress Notes (Signed)
Office Visit Note   Patient: Kristin Saunders           Date of Birth: 1990-12-28           MRN: [id number] Visit Date: 12/08/2019              Requested by: Kristin Saunders, [address],  [place] [address] PCP: Kristin Saunders   Assessment & Plan: Visit Diagnoses:  1. Spondylolisthesis at L5-S1 level   2. Chronic bilateral low back pain with bilateral sciatica   3. Spondylolysis     Plan: We discussed patient it appears that fusion procedure will be necessary step for improvement in her back pain she has failed physical therapy anti-inflammatories epidural injections.  She has a disc protrusion L5-S1 with biforaminal stenosis, bilateral pars defects and spondylolisthesis.  We discussed single instrumented fusion use of her brace.  She needed help taking care of her children postoperatively and she have to make some arrangements to consider this.  Follow-Up Instructions: No follow-ups on file.   Orders:  Orders Placed This Encounter  Procedures  . Ambulatory referral to Orthopedic Surgery   No orders of the defined types were placed in this encounter.     Procedures: No procedures performed   Clinical Data: No additional findings.   Subjective: Chief Complaint  Patient presents with  . Lower Back - Pain, Follow-up    HPI 29 year old female returns with ongoing chronic back pain and bilateral L5 spondylolysis with anterolisthesis and severe bilateral foraminal stenosis.  She has had to quit her job working at the [employer] due to the pain she is having in her back.  She had also worked at [employer] and has children at home that she takes care of.  Her father is been with her in previous visits but is not here today.  She is a prednisone pack and physical therapy without improvement.  She denies bowel or bladder symptoms she has back pain that radiates into her buttocks down into her legs and she has to lay down or sit to get relief.  Her back pain  is been present and gradually increasing for more than a year and she has continued to work up until the last several weeks due to increasing in her pain.  Epidural was done unfortunately she did not get significant relief more than just a few days.  Review of Systems positive for childbirth with healthy healthy children.  Positive for acid reflux patient does not smoke chronic back pain with bilateral pars defect and chronic spondylolisthesis.   Objective: Vital Signs: BP 104/75   Pulse 87   Ht 5\' 6"  (1.676 m)   Wt 200 lb (90.7 kg)   BMI 32.28 kg/m   Physical Exam Constitutional:      Appearance: She is well-developed.  HENT:     Head: Normocephalic.     Right Ear: External ear normal.     Left Ear: External ear normal.  Eyes:     Pupils: Pupils are equal, round, and reactive to light.  Neck:     Thyroid: No thyromegaly.     Trachea: No tracheal deviation.  Cardiovascular:     Rate and Rhythm: Normal rate.  Pulmonary:     Effort: Pulmonary effort is normal.  Abdominal:     Palpations: Abdomen is soft.  Skin:    General: Skin is warm and dry.  Neurological:     Mental Status: She is alert and  oriented to person, place, and time.  Psychiatric:        Behavior: Behavior normal.     Ortho Exam patient has pain with straight leg raising 60 degrees right and left.  She has tight hamstrings limited forward flexion to mid tibia region.  She has some EHL weakness and trace anterior tib weakness with normal gastrocsoleus strength right and left. Specialty Comments:  No specialty comments available.  Imaging:  Disc levels:  T11-12 through L4-5: The discs are normal. No foraminal or spinal stenosis. No facet arthritis.  L5-S1: Broad-based soft disc protrusion slightly asymmetric to the right extending into both neural foramina. 5 mm spondylolisthesis with bilateral pars defects. Both L5 nerves are compressed in the neural foramina best demonstrated on images 3, 9 and 10  of series 8. No impingement upon the thecal sac.  IMPRESSION: 1. Bilateral pars defects at L5 with 5 mm spondylolisthesis and a broad-based soft disc protrusion which compresses both L5 nerves in the neural foramina. 2. Otherwise, normal exam.   Electronically Signed By: Kristin Saunders M.D.  PMFS History: Patient Active Problem List   Diagnosis Date Noted  . Spondylolisthesis at L5-S1 level 03/11/2019  . Spondylolysis 03/11/2019  . Chronic bilateral low back pain with bilateral sciatica 03/11/2019  . Obesity (BMI 30.0-34.9) 03/19/2018   Past Medical History:  Diagnosis Date  . Medical history non-contributory     Family History  Problem Relation Age of Onset  . Cancer Father        COLON    Past Surgical History:  Procedure Laterality Date  . NO PAST SURGERIES    . TUBAL LIGATION     Social History   Occupational History  . Not on file  Tobacco Use  . Smoking status: Never Smoker  . Smokeless tobacco: Never Used  Substance and Sexual Activity  . Alcohol use: Yes    Comment: rare  . Drug use: No  . Sexual activity: Not on file

## 2019-12-09 ENCOUNTER — Ambulatory Visit: Payer: Medicaid Other | Admitting: Physical Therapy

## 2019-12-10 DIAGNOSIS — N3001 Acute cystitis with hematuria: Secondary | ICD-10-CM | POA: Diagnosis not present

## 2019-12-10 DIAGNOSIS — R300 Dysuria: Secondary | ICD-10-CM | POA: Diagnosis not present

## 2019-12-11 ENCOUNTER — Ambulatory Visit: Payer: Medicaid Other | Admitting: Physical Therapy

## 2019-12-12 ENCOUNTER — Ambulatory Visit: Payer: Medicaid Other | Admitting: Physical Therapy

## 2019-12-12 DIAGNOSIS — R519 Headache, unspecified: Secondary | ICD-10-CM | POA: Diagnosis not present

## 2019-12-12 DIAGNOSIS — M79601 Pain in right arm: Secondary | ICD-10-CM | POA: Diagnosis not present

## 2019-12-12 DIAGNOSIS — M79602 Pain in left arm: Secondary | ICD-10-CM | POA: Diagnosis not present

## 2019-12-12 DIAGNOSIS — M542 Cervicalgia: Secondary | ICD-10-CM | POA: Diagnosis not present

## 2019-12-12 DIAGNOSIS — Z9851 Tubal ligation status: Secondary | ICD-10-CM | POA: Diagnosis not present

## 2019-12-17 ENCOUNTER — Encounter: Payer: Self-pay | Admitting: Physical Therapy

## 2019-12-17 ENCOUNTER — Other Ambulatory Visit: Payer: Self-pay

## 2019-12-17 ENCOUNTER — Ambulatory Visit: Payer: Medicaid Other | Admitting: Physical Therapy

## 2019-12-17 DIAGNOSIS — M5442 Lumbago with sciatica, left side: Secondary | ICD-10-CM | POA: Diagnosis not present

## 2019-12-17 DIAGNOSIS — R293 Abnormal posture: Secondary | ICD-10-CM

## 2019-12-17 DIAGNOSIS — M6281 Muscle weakness (generalized): Secondary | ICD-10-CM

## 2019-12-17 DIAGNOSIS — G8929 Other chronic pain: Secondary | ICD-10-CM

## 2019-12-17 DIAGNOSIS — M5441 Lumbago with sciatica, right side: Secondary | ICD-10-CM | POA: Diagnosis not present

## 2019-12-17 NOTE — Patient Instructions (Addendum)
°  Scapular Retraction: Bilateral ° °Facing anchor, pull arms back, bringing shoulder blades together. °Repeat _30___ times per set. Do __2-3__ sets per session. Do _2___ sessions per day. ° ° ° °Standing lat pull with theraband ° °Anchor bands higher than your head. Start with your arms straight out in front of you at shoulder height (or a little above).  °Pull bands down next to your body and then slowly return to the starting position.  °10-30 x1day ° ° ° °Bridging with Theraband ° °Begin by lying on your back with your knees bent and theraband around your knees. Tighten your abs by tilting your pelvis up and flattening your back on the mat. Squeeze your glutes and raise your hips off of the table towards the ceiling. Keeping your hips raised, pull your knees outward against the band. Relax your knees back to midline and slowly return your hips to the mat. Relax and Breathe 2x10 1x daily ° ° ° °

## 2019-12-17 NOTE — Therapy (Signed)
[facility] [place], [place], [address] Phone: [phone]   Fax:  [phone]  Physical Therapy Treatment  Patient Details  Name: Kristin Saunders MRN: [id number] Date of Birth: 06-Jan-1991 Referring Provider (PT): Rodell Perna   Encounter Date: 12/17/2019  PT End of Session - 12/17/19 1310    Visit Number  8    Number of Visits  10    Date for PT Re-Evaluation  12/30/19    PT Start Time  0105    PT Stop Time  0146    PT Time Calculation (min)  41 min    Activity Tolerance  Patient tolerated treatment well;Patient limited by pain    Behavior During Therapy  [facility] for tasks assessed/performed       Past Medical History:  Diagnosis Date  . Medical history non-contributory     Past Surgical History:  Procedure Laterality Date  . NO PAST SURGERIES    . TUBAL LIGATION      There were no vitals filed for this visit.  Subjective Assessment - 12/17/19 1306    Subjective  COVID 19 screening performed on patient upon arrival. Patient arrived with ongoing discomfort in back and still in neck also    Pertinent History  Bilateral pars defects at L5 with 5 mm spondylolisthesis and abroad-based soft disc protrusion which compresses both L5 nerves inthe neural foramina    Limitations  Walking;House hold activities;Standing    How long can you sit comfortably?  unlimited    How long can you stand comfortably?  approximately 10 minutes    How long can you walk comfortably?  approximately 10 minutes    Diagnostic tests  MRI: Bilateral pars defects at L5 with 5 mm spondylolisthesis and abroad-based soft disc protrusion which compresses both L5 nerves inthe neural foramina.    Currently in Pain?  Yes    Pain Score  8     Pain Location  Back    Pain Orientation  Right;Left    Pain Descriptors / Indicators  Discomfort    Pain Type  Chronic pain    Pain Onset  More than a month ago    Pain Frequency  Intermittent    Aggravating Factors    prolong activity    Pain Relieving Factors  rest                       [facility] Adult PT Treatment/Exercise - 12/17/19 0001      Lumbar Exercises: Aerobic   Nustep  L4 x15 min UE/LE activity, posture focus, monitored for progression      Lumbar Exercises: Standing   Scapular Retraction  Strengthening;Both;20 reps;Limitations    Theraband Level (Scapular Retraction)  Level 3 (Green)    Shoulder Extension  Strengthening;Both;20 reps;Limitations    Theraband Level (Shoulder Extension)  Level 3 (Green)      Lumbar Exercises: Supine   Bent Knee Raise  20 reps;3 seconds    Bridge with clamshell  20 reps;3 seconds   red t-band   Straight Leg Raise  20 reps;3 seconds      Moist Heat Therapy   Number Minutes Moist Heat  15 Minutes    Moist Heat Location  Lumbar Spine      Electrical Stimulation   Electrical Stimulation Location  B low back    Electrical Stimulation Action  premod    Electrical Stimulation Parameters  80-'150hz'  x54mn    Electrical Stimulation Goals  Pain  PT Education - 12/17/19 1332    Education Details  HEP progression    Person(s) Educated  Patient    Methods  Explanation;Demonstration;Handout    Comprehension  Verbalized understanding;Returned demonstration       PT Short Term Goals - 11/20/19 1310      PT SHORT TERM GOAL #1   Title  Patient will be independent with HEP    Baseline  no knowledge of HEP    Time  3    Period  Weeks    Status  Achieved      PT SHORT TERM GOAL #2   Title  Patient will demonstrate proper log rolling bed mobility to protect spine during transitional movements.    Baseline  Supine to long sit transition.    Time  3    Period  Weeks    Status  Achieved   good technique 11/20/19       PT Long Term Goals - 12/17/19 1309      PT LONG TERM GOAL #1   Title  Patient will be independent with advanced HEP    Baseline  issued progression today met 12/17/19    Time  4    Period  Weeks    Status   Achieved   reported doing some daily 12/17/19     PT LONG TERM GOAL #2   Title  Patient will demonstrate 4+/5 bilateral hip abduction and extension MMT to improve stablity during functional tasks    Baseline  4/5 bilaterally in abduction and extension    Time  4    Period  Weeks    Status  On-going      PT LONG TERM GOAL #3   Title  Patient will report ability to perform ADLs with low back pain less than or equal to 5/10.    Baseline  at worst 10/10 in low back with activities    Time  4    Period  Weeks    Status  On-going   10/10 reported 12/17/19     PT LONG TERM GOAL #4   Title  Patient will report a centralization of neurological symptoms to bilateral knees or higher to decrease bilateral nerve irritation    Baseline  neurological symptoms of numbness and tingling to bilateral feet.    Time  4    Period  Weeks    Status  On-going   symptoms improved yet ongoing 12/17/19           Plan - 12/17/19 1317    Clinical Impression Statement  Patient tolerated treatment fair due to ongoing pain in low back and in neck also. Patient is unable to perform all ADL's due to pain level increaseing to 10/10 and requires rest to complete all tasks. Patient continues to have symptoms down LE yet not as frequent. Patient reported doing some exercises and stretches daily. Issued HEP progression today and will progress next treatment with LE strengthening activities. LTG met with other goals ongoing at this time.    Personal Factors and Comorbidities  Age    Examination-Activity Limitations  Bend;Squat;Sleep;Stand;Stairs;Transfers;Dressing    Examination-Participation Restrictions  Cleaning;Meal Prep    Stability/Clinical Decision Making  Stable/Uncomplicated    Rehab Potential  Fair    PT Frequency  2x / week    PT Duration  4 weeks    PT Treatment/Interventions  ADLs/Self Care Home Management;Iontophoresis 69m/ml Dexamethasone;Moist Heat;Ultrasound;Traction;Electrical  Stimulation;Cryotherapy;Gait training;Stair training;Functional mobility training;Therapeutic activities;Therapeutic exercise;Passive range of motion;Dry needling;Neuromuscular re-education;Manual  techniques;Taping;Patient/family education;Spinal Manipulations    PT Next Visit Plan  continue POC Nustep, core strengthening and stabilization,Progress LE strengthening (hip abd/ext) and stretching, modalities PRN for pain relief    Consulted and Agree with Plan of Care  Patient       Patient will benefit from skilled therapeutic intervention in order to improve the following deficits and impairments:  Pain, Postural dysfunction, Difficulty walking, Decreased range of motion, Decreased mobility, Decreased activity tolerance, Decreased strength  Visit Diagnosis: Chronic bilateral low back pain with bilateral sciatica  Abnormal posture  Muscle weakness (generalized)     Problem List Patient Active Problem List   Diagnosis Date Noted  . Spondylolisthesis at L5-S1 level 03/11/2019  . Spondylolysis 03/11/2019  . Chronic bilateral low back pain with bilateral sciatica 03/11/2019  . Obesity (BMI 30.0-34.9) 03/19/2018    Mekhi Lascola P, PTA 12/17/2019, 1:47 PM  [facility] [place], [place], [address] Phone: [phone]   Fax:  [phone]  Name: Kristin Saunders MRN: [id number] Date of Birth: Feb 13, 1991

## 2019-12-19 ENCOUNTER — Emergency Department (HOSPITAL_COMMUNITY): Payer: Medicaid Other

## 2019-12-19 ENCOUNTER — Encounter: Payer: Self-pay | Admitting: Family

## 2019-12-19 ENCOUNTER — Other Ambulatory Visit: Payer: Self-pay

## 2019-12-19 ENCOUNTER — Encounter (HOSPITAL_COMMUNITY): Payer: Self-pay | Admitting: *Deleted

## 2019-12-19 ENCOUNTER — Emergency Department (HOSPITAL_COMMUNITY)
Admission: EM | Admit: 2019-12-19 | Discharge: 2019-12-19 | Disposition: A | Discharge status: Home / Self Care | Payer: Medicaid Other | Attending: Emergency Medicine | Admitting: Emergency Medicine

## 2019-12-19 ENCOUNTER — Ambulatory Visit (INDEPENDENT_AMBULATORY_CARE_PROVIDER_SITE_OTHER): Payer: Medicaid Other | Admitting: Family

## 2019-12-19 DIAGNOSIS — G43909 Migraine, unspecified, not intractable, without status migrainosus: Secondary | ICD-10-CM

## 2019-12-19 DIAGNOSIS — R2689 Other abnormalities of gait and mobility: Secondary | ICD-10-CM | POA: Diagnosis not present

## 2019-12-19 DIAGNOSIS — H539 Unspecified visual disturbance: Secondary | ICD-10-CM | POA: Diagnosis not present

## 2019-12-19 DIAGNOSIS — R202 Paresthesia of skin: Secondary | ICD-10-CM | POA: Diagnosis not present

## 2019-12-19 DIAGNOSIS — R519 Headache, unspecified: Secondary | ICD-10-CM

## 2019-12-19 LAB — CBC WITH DIFFERENTIAL/PLATELET
Abs Immature Granulocytes: 0.01 10*3/uL (ref 0.00–0.07)
Basophils Absolute: 0 10*3/uL (ref 0.0–0.1)
Basophils Relative: 0 %
Eosinophils Absolute: 0 10*3/uL (ref 0.0–0.5)
Eosinophils Relative: 1 %
HCT: 43.2 % (ref 36.0–46.0)
Hemoglobin: 14.2 g/dL (ref 12.0–15.0)
Immature Granulocytes: 0 %
Lymphocytes Relative: 21 %
Lymphs Abs: 1.6 10*3/uL (ref 0.7–4.0)
MCH: 32.5 pg (ref 26.0–34.0)
MCHC: 32.9 g/dL (ref 30.0–36.0)
MCV: 98.9 fL (ref 80.0–100.0)
Monocytes Absolute: 0.4 10*3/uL (ref 0.1–1.0)
Monocytes Relative: 5 %
Neutro Abs: 5.6 10*3/uL (ref 1.7–7.7)
Neutrophils Relative %: 73 %
Platelets: 286 10*3/uL (ref 150–400)
RBC: 4.37 MIL/uL (ref 3.87–5.11)
RDW: 12.8 % (ref 11.5–15.5)
WBC: 7.7 10*3/uL (ref 4.0–10.5)
nRBC: 0 % (ref 0.0–0.2)

## 2019-12-19 LAB — URINALYSIS, ROUTINE W REFLEX MICROSCOPIC
Bilirubin Urine: NEGATIVE
Glucose, UA: NEGATIVE mg/dL
Ketones, ur: NEGATIVE mg/dL
Nitrite: NEGATIVE
Protein, ur: NEGATIVE mg/dL
Specific Gravity, Urine: 1.024 (ref 1.005–1.030)
pH: 6 (ref 5.0–8.0)

## 2019-12-19 LAB — BASIC METABOLIC PANEL
Anion gap: 9 (ref 5–15)
BUN: 17 mg/dL (ref 6–20)
CO2: 25 mmol/L (ref 22–32)
Calcium: 8.8 mg/dL — ABNORMAL LOW (ref 8.9–10.3)
Chloride: 103 mmol/L (ref 98–111)
Creatinine, Ser: 0.75 mg/dL (ref 0.44–1.00)
GFR calc Af Amer: >60 mL/min (ref >60)
GFR calc non Af Amer: >60 mL/min (ref >60)
Glucose, Bld: 91 mg/dL (ref 70–99)
Potassium: 3.7 mmol/L (ref 3.5–5.1)
Sodium: 137 mmol/L (ref 135–145)

## 2019-12-19 LAB — PREGNANCY, URINE: Preg Test, Ur: NEGATIVE

## 2019-12-19 IMAGING — CT CT HEAD W/O CM
3 series · 16 of 47 positions shown, 19 images · non-contrast
Comparison: None

CLINICAL DATA: Headache with tingling and burning sensation for 3
days.

EXAM:
CT HEAD WITHOUT CONTRAST
TECHNIQUE: Contiguous axial images were obtained from the base of the skull
through the vertex without intravenous contrast.

[Series 2: head w o · axial · 0.46mm/px · z∈[-8,+117]mm · 10 of 31 slices shown, 13 images]
[im 3/31  brain]
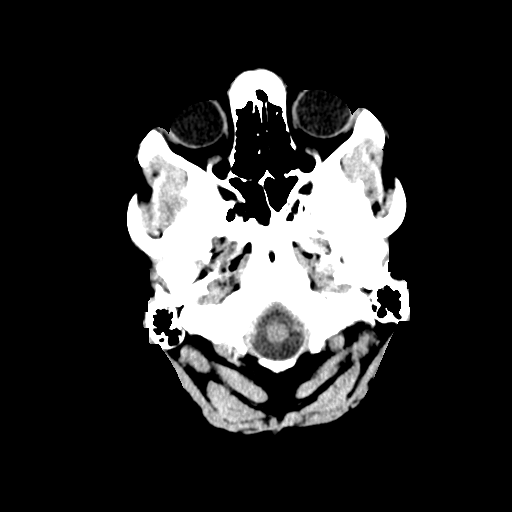
[im 3/31  bone]
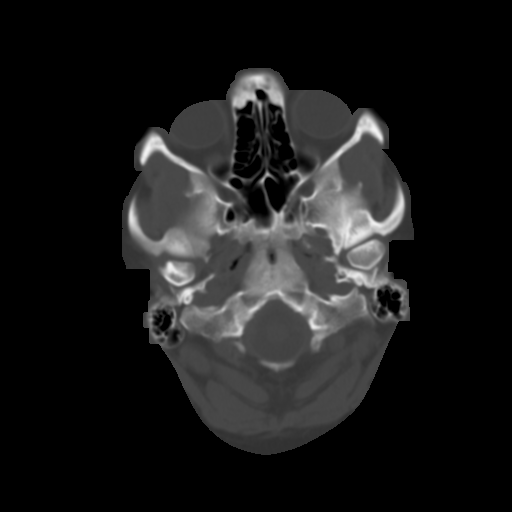
[im 6/31  brain]
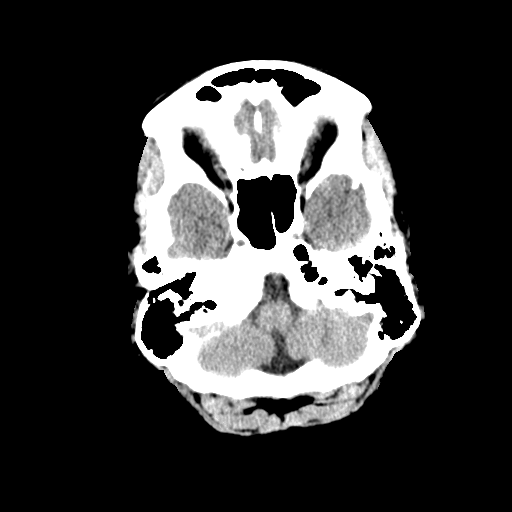
[im 9/31  brain]
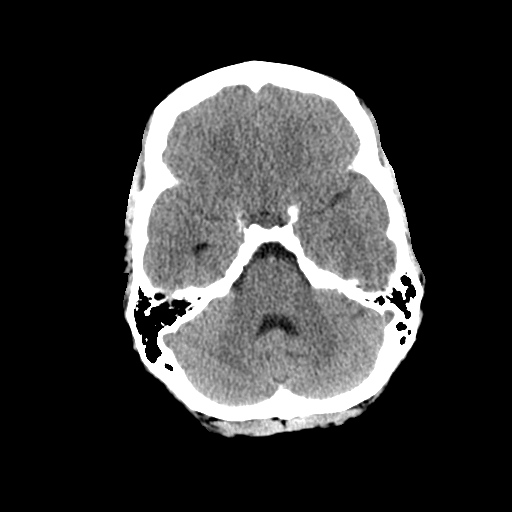
[im 11/31  brain]
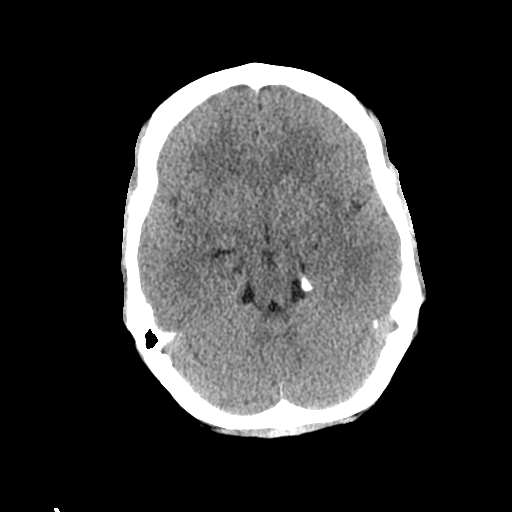
[im 14/31  brain]
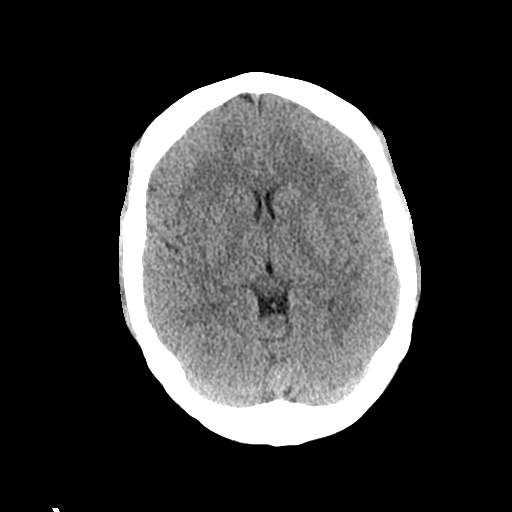
[im 14/31  bone]
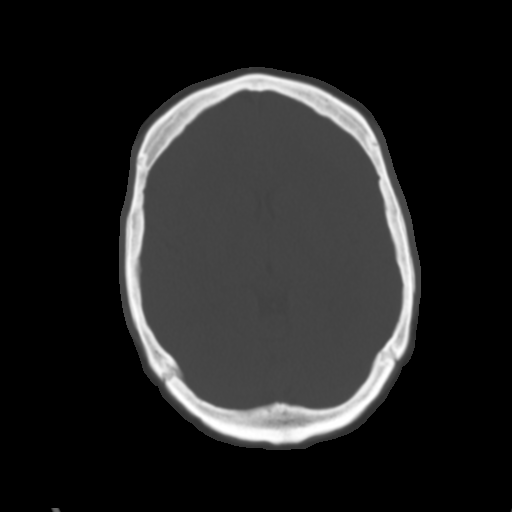
[im 17/31  brain]
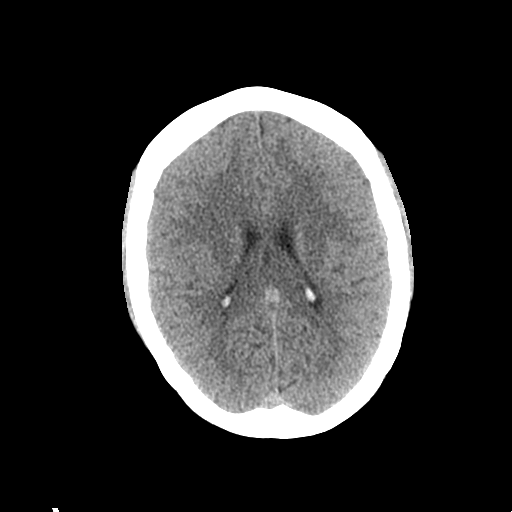
[im 20/31  brain]
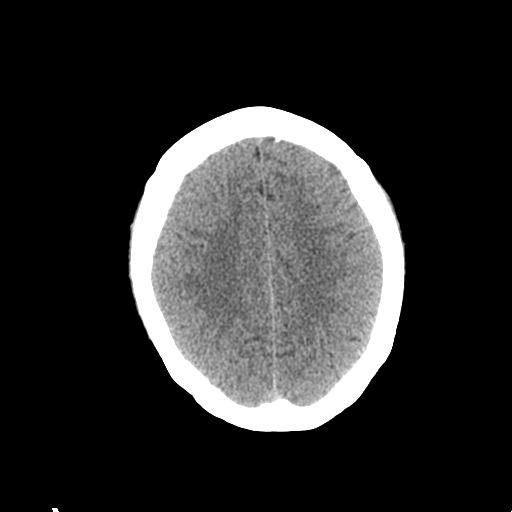
[im 23/31  brain]
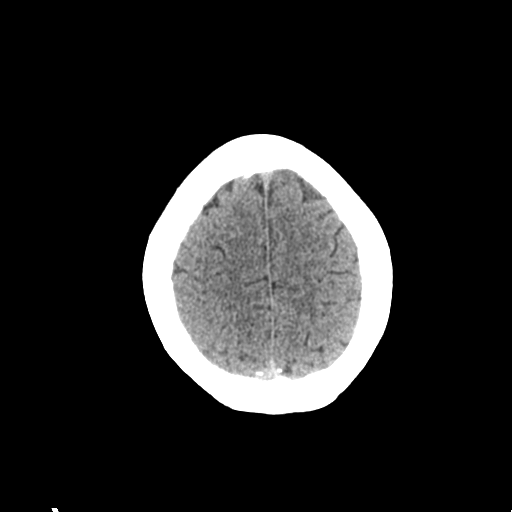
[im 25/31  brain]
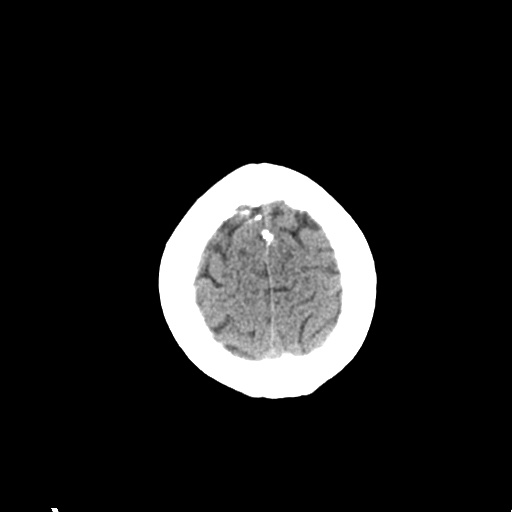
[im 25/31  bone]
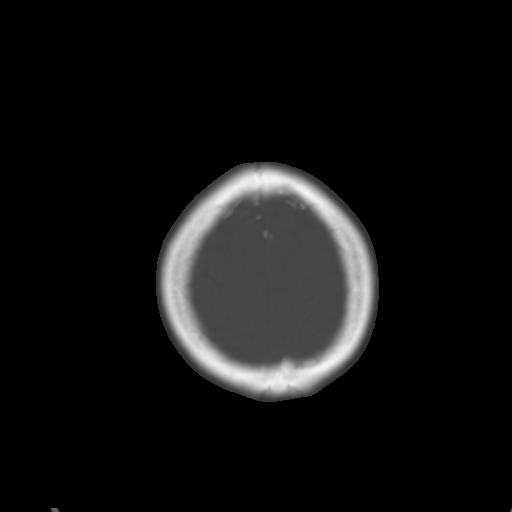
[im 28/31  brain]
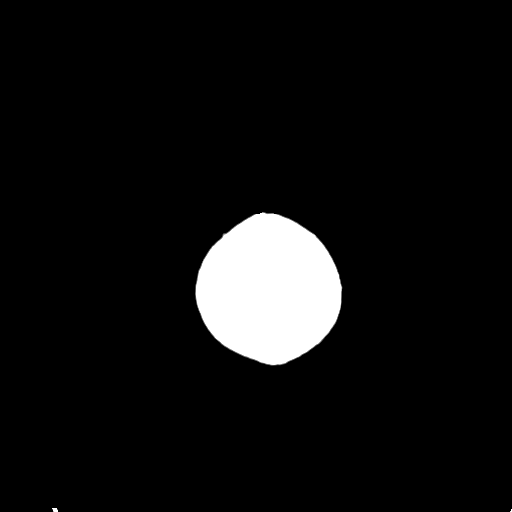

[Series 4: coronal soft · coronal · 0.30mm/px · 3 of 64 slices shown]
[im 22/64  brain]
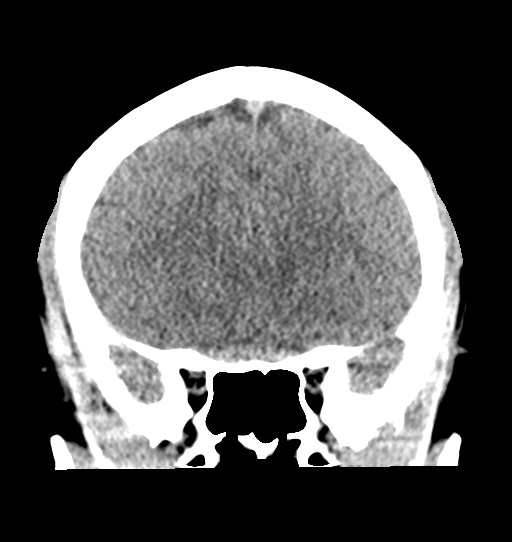
[im 29/64  brain]
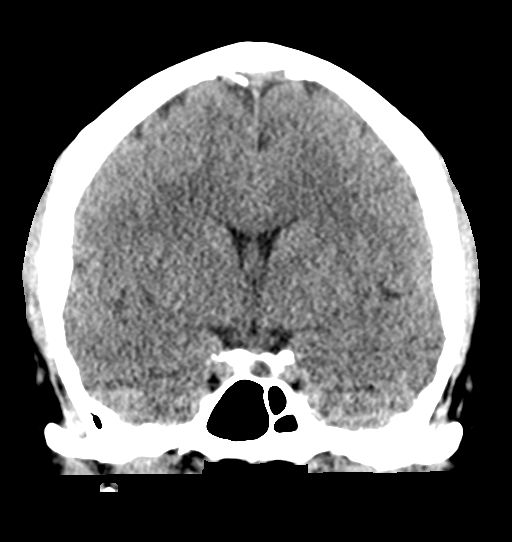
[im 36/64  brain]
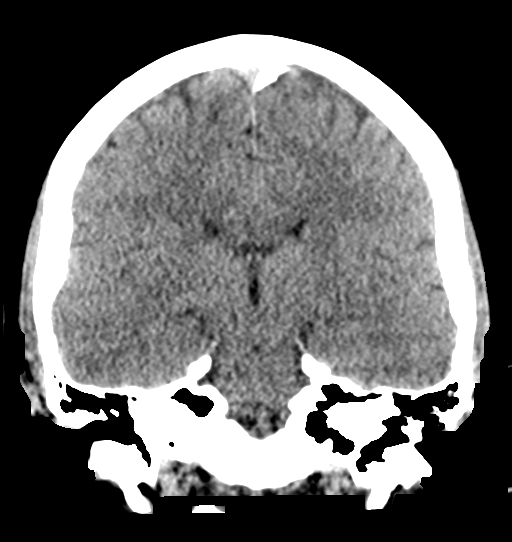

[Series 5: sagittal soft · sagittal · 0.35mm/px · 3 of 52 slices shown]
[im 18/52  brain]
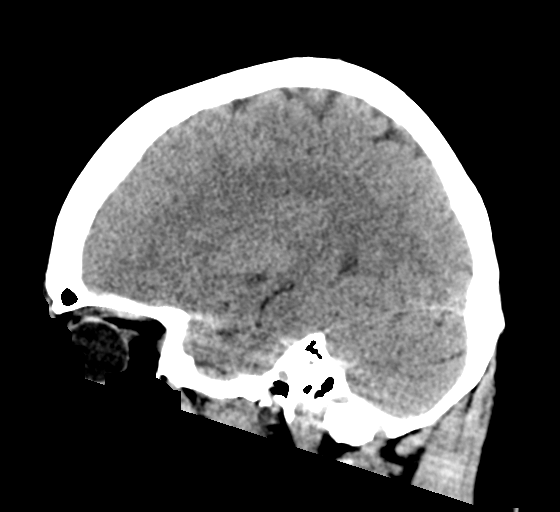
[im 26/52  brain]
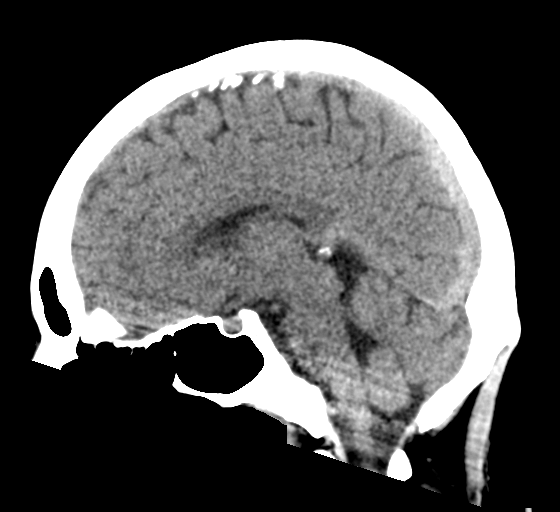
[im 35/52  brain]
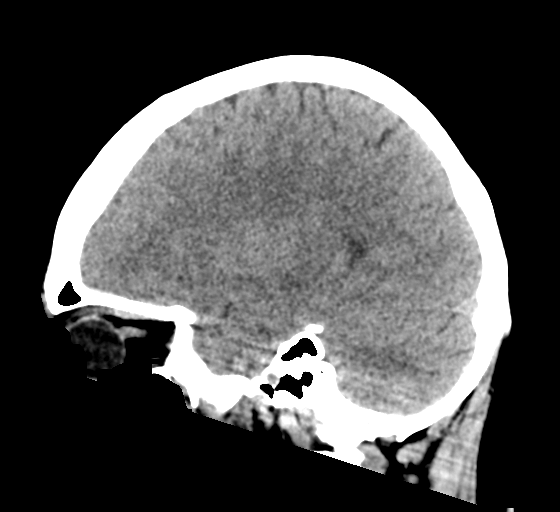

[16 of 47 positions shown; findings below may reference images not displayed]

FINDINGS: Brain: The brainstem, cerebellum, cerebral peduncles, thalami, basal
ganglia, basilar cisterns, and ventricular system appear within
normal limits. No intracranial hemorrhage, mass lesion, or acute
CVA.

Vascular: Unremarkable

Skull: Unremarkable

Sinuses/Orbits: Unremarkable

Other: Unremarkable
IMPRESSION: No significant abnormality identified.

## 2019-12-19 MED ORDER — KETOROLAC TROMETHAMINE 60 MG/2ML IM SOLN
60.0000 mg | Freq: Once | INTRAMUSCULAR | Status: AC
  Administered 2019-12-19: 60 mg via INTRAMUSCULAR
  Filled 2019-12-19: qty 2

## 2019-12-19 MED ORDER — DIPHENHYDRAMINE HCL 25 MG PO CAPS
25.0000 mg | ORAL_CAPSULE | Freq: Once | ORAL | Status: AC
  Administered 2019-12-19: 18:00:00 25 mg via ORAL
  Filled 2019-12-19: qty 1

## 2019-12-19 MED ORDER — METOCLOPRAMIDE HCL 5 MG/ML IJ SOLN
10.0000 mg | Freq: Once | INTRAMUSCULAR | Status: AC
  Administered 2019-12-19: 10 mg via INTRAMUSCULAR
  Filled 2019-12-19: qty 2

## 2019-12-19 NOTE — ED Notes (Signed)
Orthostatic VS  Lying 117/77, 68,18, 99 per cent RA  Sitting  118/77, 67,16 97 per cent RA  Standing  111/77, 90.16.98 per cent RA

## 2019-12-19 NOTE — Discharge Instructions (Addendum)
Try taking over-the-counter Excedrin Migraine as directed for your headaches.  Follow-up with your primary doctor for recheck.  Return to emergency department for any worsening symptoms.

## 2019-12-19 NOTE — ED Notes (Signed)
Sent from [facility] for complaint of HA Ear fullness and complaint of weakness, change in vision  For several days   She has a history of migraines per her report   Is not tender to her sinus areas   Ambulates heel to toe without stagger of drift

## 2019-12-19 NOTE — ED Triage Notes (Signed)
States she feels like her head is on fire and her arms are tingling for 3 days, states she was advised by her PCP to come in for a CT scan of her head

## 2019-12-19 NOTE — Progress Notes (Signed)
   Virtual Visit via telephone Note Due to COVID-19 pandemic this visit was conducted virtually. This visit type was conducted due to national recommendations for restrictions regarding the COVID-19 Pandemic (e.g. social distancing, sheltering in place) in an effort to limit this patient's exposure and mitigate transmission in our community. All issues noted in this document were discussed and addressed.  A physical exam was not performed with this format.  I connected with Kristin Saunders on 12/19/19 at 1:59 pm by telephone and verified that I am speaking with the correct person using two identifiers. Kristin Saunders is currently located at home and no one is currently with her during visit. The provider, [name], FNP is located in their office at time of visit.  I discussed the limitations, risks, security and privacy concerns of performing an evaluation and management service by telephone and the availability of in person appointments. I also discussed with the patient that there may be a patient responsible charge related to this service. The patient expressed understanding and agreed to proceed.   History and Present Illness:  Headache  This is a new problem. The current episode started in the past 7 days. The problem occurs constantly. The problem has been gradually worsening. The pain is located in the bilateral region. The pain radiates to the right arm and left arm. The pain quality is not similar to prior headaches. Quality: burning. The pain is at a severity of 10/10. The pain is moderate. Associated symptoms include blurred vision, ear pain, a loss of balance, phonophobia, photophobia, scalp tenderness, sinus pressure, a visual change and weakness. Pertinent negatives include no hearing loss or sore throat. The symptoms are aggravated by emotional stress. She has tried triptans for the symptoms. The treatment provided mild relief.      Review of Systems  HENT: Positive for ear pain  and sinus pressure. Negative for hearing loss and sore throat.   Eyes: Positive for blurred vision and photophobia.  Neurological: Positive for weakness, headaches and loss of balance.  All other systems reviewed and are negative.    Observations/Objective: No SOB or distress   Assessment and Plan: 1. Nonintractable headache, unspecified chronicity pattern, unspecified headache type  2. Loss of balance  3. Vision changes  Given "new" headache with vision changes, loss balance, and weakness I am recommending pt go to ED for scan. She does not sound in acute distress, but given current symptoms it is worrisome other than headache.     I discussed the assessment and treatment plan with the patient. The patient was provided an opportunity to ask questions and all were answered. The patient agreed with the plan and demonstrated an understanding of the instructions.   The patient was advised to call back or seek an in-person evaluation if the symptoms worsen or if the condition fails to improve as anticipated.  The above assessment and management plan was discussed with the patient. The patient verbalized understanding of and has agreed to the management plan. Patient is aware to call the clinic if symptoms persist or worsen. Patient is aware when to return to the clinic for a follow-up visit. Patient educated on when it is appropriate to go to the emergency department.   Time call ended:  2:09 pm  I provided 10 minutes of non-face-to-face time during this encounter.    [name], FNP

## 2019-12-20 NOTE — ED Provider Notes (Signed)
[facility] EMERGENCY DEPARTMENT Provider Note   CSN: [id number] Arrival date & time: 12/19/19  1510     History Chief Complaint  Patient presents with  . Headache    Kristin Saunders is a 29 y.o. female.  HPI      Kristin Saunders is a 29 y.o. female who presents to the Emergency Department complaining of diffuse headache for 3 days.  Describes the headache as a burning, sharp sensation to her forehead that radiates into the back of her head, neck and into both shoulders.  Intermittent tingling into both arms and hands.  Some sensitivity to light.  Headache is not associated with nausea, vomiting, neck pain or stiffness, fever or chills.  She reports increased stressors lately and has not been sleeping well. she has been taking OTC sinus medication without relief.  She denies extremity numbness or weakness, recent illness, sinus pressure, nasal congestion, loss of taste or smell.  No known covid exposures. She contacted her PCP's office and advised to come to ER for further evaluation       Past Medical History:  Diagnosis Date  . Medical history non-contributory     Patient Active Problem List   Diagnosis Date Noted  . Spondylolisthesis at L5-S1 level 03/11/2019  . Spondylolysis 03/11/2019  . Chronic bilateral low back pain with bilateral sciatica 03/11/2019  . Obesity (BMI 30.0-34.9) 03/19/2018    Past Surgical History:  Procedure Laterality Date  . NO PAST SURGERIES    . TUBAL LIGATION       OB History    Gravida  2   Para  1   Term  1   Preterm      AB      Living  1     SAB      TAB      Ectopic      Multiple      Live Births              Family History  Problem Relation Age of Onset  . Cancer Father        COLON    Social History   Tobacco Use  . Smoking status: Never Smoker  . Smokeless tobacco: Never Used  Substance Use Topics  . Alcohol use: Yes    Comment: rare  . Drug use: No    Home Medications Prior to Admission  medications   Not on File    Allergies    Patient has no known allergies.  Review of Systems   Review of Systems  Constitutional: Negative for activity change, appetite change and fever.  HENT: Negative for congestion, facial swelling, rhinorrhea, sinus pressure, sinus pain and trouble swallowing.   Eyes: Positive for photophobia. Negative for pain and visual disturbance.  Respiratory: Negative for chest tightness and shortness of breath.   Cardiovascular: Negative for chest pain.  Gastrointestinal: Negative for nausea and vomiting.  Musculoskeletal: Negative for neck pain and neck stiffness.  Skin: Negative for rash and wound.  Neurological: Positive for headaches. Negative for dizziness, syncope, facial asymmetry, speech difficulty, weakness and numbness.  Psychiatric/Behavioral: Negative for confusion and decreased concentration.    Physical Exam Updated Vital Signs BP (!) 116/52 (BP Location: Right Arm)   Pulse 98   Temp 98.6 F (37 C) (Oral)   Resp 18   Ht 5\' 6"  (1.676 m)   Wt 117.9 kg   LMP 12/12/2019   SpO2 100%   BMI 41.97 kg/m   Physical Exam Vitals  and nursing note reviewed.  Constitutional:      General: She is not in acute distress.    Appearance: She is well-developed.  HENT:     Head: Normocephalic and atraumatic.  Eyes:     Extraocular Movements: Extraocular movements intact.     Conjunctiva/sclera: Conjunctivae normal.     Pupils: Pupils are equal, round, and reactive to light.  Neck:     Trachea: Phonation normal.     Meningeal: Kernig's sign absent.  Cardiovascular:     Rate and Rhythm: Normal rate and regular rhythm.  Pulmonary:     Effort: Pulmonary effort is normal. No respiratory distress.     Breath sounds: Normal breath sounds.  Musculoskeletal:        General: Normal range of motion.     Cervical back: Normal range of motion and neck supple. No rigidity. No spinous process tenderness or muscular tenderness.  Skin:    General: Skin is  warm and dry.     Capillary Refill: Capillary refill takes less than 2 seconds.     Findings: No rash.  Neurological:     General: No focal deficit present.     Mental Status: She is alert and oriented to person, place, and time.     GCS: GCS eye subscore is 4. GCS verbal subscore is 5. GCS motor subscore is 6.     Cranial Nerves: No cranial nerve deficit.     Sensory: Sensation is intact. No sensory deficit.     Motor: Motor function is intact. No abnormal muscle tone.     Coordination: Coordination is intact. Coordination normal.     Gait: Gait normal.     Deep Tendon Reflexes:     Reflex Scores:      Tricep reflexes are 2+ on the right side and 2+ on the left side.      Bicep reflexes are 2+ on the right side and 2+ on the left side.    Comments: CN III-XII grossly intact.  Speech clear.  No pronator drift.  Psychiatric:        Thought Content: Thought content normal.     ED Results / Procedures / Treatments   Labs (all labs ordered are listed, but only abnormal results are displayed) Labs Reviewed  URINALYSIS, ROUTINE W REFLEX MICROSCOPIC - Abnormal; Notable for the following components:      Result Value   APPearance HAZY (*)    Hgb urine dipstick MODERATE (*)    Leukocytes,Ua TRACE (*)    Bacteria, UA RARE (*)    All other components within normal limits  BASIC METABOLIC PANEL - Abnormal; Notable for the following components:   Calcium 8.8 (*)    All other components within normal limits  PREGNANCY, URINE  CBC WITH DIFFERENTIAL/PLATELET    EKG None  Radiology CT Head Wo Contrast  Result Date: 12/19/2019 CLINICAL DATA:  Headache with tingling and burning sensation for 3 days. EXAM: CT HEAD WITHOUT CONTRAST TECHNIQUE: Contiguous axial images were obtained from the base of the skull through the vertex without intravenous contrast. COMPARISON:  None FINDINGS: Brain: The brainstem, cerebellum, cerebral peduncles, thalami, basal ganglia, basilar cisterns, and ventricular  system appear within normal limits. No intracranial hemorrhage, mass lesion, or acute CVA. Vascular: Unremarkable Skull: Unremarkable Sinuses/Orbits: Unremarkable Other: Unremarkable IMPRESSION: No significant abnormality identified. Electronically Signed   By: Gaylyn Rong M.D.   On: 12/19/2019 17:42    Procedures Procedures (including critical care time)  Medications Ordered in  ED Medications  ketorolac (TORADOL) injection 60 mg (60 mg Intramuscular Given 12/19/19 1828)  diphenhydrAMINE (BENADRYL) capsule 25 mg (25 mg Oral Given 12/19/19 1827)  metoCLOPramide (REGLAN) injection 10 mg (10 mg Intramuscular Given 12/19/19 1830)    ED Course  I have reviewed the triage vital signs and the nursing notes.  Pertinent labs & imaging results that were available during my care of the patient were reviewed by me and considered in my medical decision making (see chart for details).    MDM Rules/Calculators/A&P                      Pt with gradual onset headache for 3 days.  No focal neuro deficits. No meningeal signs.  Pt with increased stressors and decreased sleep.  Headache felt to be tension vs migraine.    CT head reassuring and pt reported headache has greatly improved after medications given.    No concerning sx's for acute neurological process. Return precautions discussed.    Final Clinical Impression(s) / ED Diagnoses Final diagnoses:  Migraine without status migrainosus, not intractable, unspecified migraine type    Rx / DC Orders ED Discharge Orders    None       Pauline Aus, PA-C 12/20/19 1421    Bethann Berkshire, MD 12/21/19 [phone]

## 2019-12-24 ENCOUNTER — Ambulatory Visit: Payer: Medicaid Other | Attending: Orthopaedic Surgery | Admitting: Physical Therapy

## 2019-12-24 DIAGNOSIS — M6281 Muscle weakness (generalized): Secondary | ICD-10-CM | POA: Insufficient documentation

## 2019-12-24 DIAGNOSIS — G8929 Other chronic pain: Secondary | ICD-10-CM | POA: Insufficient documentation

## 2019-12-24 DIAGNOSIS — R293 Abnormal posture: Secondary | ICD-10-CM | POA: Insufficient documentation

## 2019-12-24 DIAGNOSIS — M5441 Lumbago with sciatica, right side: Secondary | ICD-10-CM | POA: Insufficient documentation

## 2019-12-24 DIAGNOSIS — M5442 Lumbago with sciatica, left side: Secondary | ICD-10-CM | POA: Insufficient documentation

## 2019-12-30 ENCOUNTER — Ambulatory Visit: Payer: Medicaid Other | Admitting: Physical Therapy

## 2019-12-30 ENCOUNTER — Other Ambulatory Visit: Payer: Self-pay

## 2019-12-30 ENCOUNTER — Encounter: Payer: Self-pay | Admitting: Physical Therapy

## 2019-12-30 DIAGNOSIS — M6281 Muscle weakness (generalized): Secondary | ICD-10-CM

## 2019-12-30 DIAGNOSIS — R293 Abnormal posture: Secondary | ICD-10-CM

## 2019-12-30 DIAGNOSIS — M5441 Lumbago with sciatica, right side: Secondary | ICD-10-CM | POA: Diagnosis not present

## 2019-12-30 DIAGNOSIS — M5442 Lumbago with sciatica, left side: Secondary | ICD-10-CM | POA: Diagnosis not present

## 2019-12-30 DIAGNOSIS — G8929 Other chronic pain: Secondary | ICD-10-CM | POA: Diagnosis not present

## 2019-12-30 NOTE — Therapy (Signed)
[facility] [place], [place], [address] Phone: [phone]   Fax:  [phone]  Physical Therapy Treatment PHYSICAL THERAPY DISCHARGE SUMMARY  Visits from Start of Care: 9  Current functional level related to goals / functional outcomes: See goals   Remaining deficits: See goals   Education / Equipment: HEP Plan: Patient agrees to discharge.  Patient goals were not met. Patient is being discharged due to lack of progress.  ?????     Patient Details  Name: Kristin Saunders MRN: [id number] Date of Birth: [birth year] Referring Provider (PT): Rodell Perna   Encounter Date: 12/30/2019  PT End of Session - 12/30/19 1315    Visit Number  9    Number of Visits  10    Date for PT Re-Evaluation  12/30/19    Authorization Type  Medicaid    PT Start Time  1308    PT Stop Time  1350    PT Time Calculation (min)  42 min    Activity Tolerance  Patient limited by pain    Behavior During Therapy  [facility] for tasks assessed/performed       Past Medical History:  Diagnosis Date  . Medical history non-contributory     Past Surgical History:  Procedure Laterality Date  . NO PAST SURGERIES    . TUBAL LIGATION      There were no vitals filed for this visit.  Subjective Assessment - 12/30/19 1315    Subjective  COVID 19 screening performed on patient upon arrival. Patient reports 7/10.    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  unlimited    How long can you stand comfortably?  approximately 10 minutes    How long can you walk comfortably?  approximately 10 minutes    Diagnostic tests  MRI: Bilateral pars defects at L5 with 5 mm spondylolisthesis and abroad-based soft disc protrusion which compresses both L5 nerves  inthe neural foramina.    Currently in Pain?  Yes    Pain Score  7     Pain Location  Back    Pain Orientation  Right;Left    Pain Descriptors / Indicators  Discomfort    Pain Type  Chronic pain    Pain Onset  More than a month ago    Pain Frequency  Intermittent         [facility] PT Assessment - 12/30/19 0001      Assessment   Medical Diagnosis  L5 Spondylolysis    Referring Provider (PT)  Rodell Perna    Next MD Visit  10/23/2019    Prior Therapy  yes      Precautions   Precautions  None      Restrictions   Weight Bearing Restrictions  No                   [facility] Adult PT Treatment/Exercise - 12/30/19 0001      Lumbar Exercises: Aerobic   Nustep  L5 x15 min UE/LE activity, posture focus, monitored for progression      Lumbar Exercises: Standing   Scapular Retraction  Strengthening;Both;20 reps;Limitations    Theraband Level (Scapular Retraction)  Level 3 (Green)    Shoulder Extension  Strengthening;Both;20 reps;Limitations    Theraband Level (Shoulder Extension)  Level 3 (  Green)      Lumbar Exercises: Supine   Bent Knee Raise  20 reps;3 seconds    Bridge with clamshell  20 reps;3 seconds   red theraband     Lumbar Exercises: Sidelying   Clam  Both;20 reps;3 seconds    Clam Limitations  red theraband      Moist Heat Therapy   Number Minutes Moist Heat  15 Minutes    Moist Heat Location  Lumbar Spine      Electrical Stimulation   Electrical Stimulation Location  B low back    Electrical Stimulation Action  pre-mod    Electrical Stimulation Parameters  80-150 hz x15 mins    Electrical Stimulation Goals  Pain               PT Short Term Goals - 11/20/19 1310      PT SHORT TERM GOAL #1   Title  Patient will be independent with HEP    Baseline  no knowledge of HEP    Time  3    Period  Weeks    Status  Achieved      PT SHORT TERM GOAL #2   Title  Patient will demonstrate proper log rolling bed mobility to protect spine during transitional  movements.    Baseline  Supine to long sit transition.    Time  3    Period  Weeks    Status  Achieved   good technique 11/20/19       PT Long Term Goals - 12/30/19 1329      PT LONG TERM GOAL #1   Title  Patient will be independent with advanced HEP    Baseline  issued progression today met 12/17/19    Time  4    Period  Weeks    Status  Achieved      PT LONG TERM GOAL #2   Title  Patient will demonstrate 4+/5 bilateral hip abduction and extension MMT to improve stablity during functional tasks    Baseline  4/5 bilaterally in abduction and extension    Time  4    Period  Weeks    Status  Partially Met      PT LONG TERM GOAL #3   Title  Patient will report ability to perform ADLs with low back pain less than or equal to 5/10.    Baseline  at worst 10/10 in low back with activities    Time  4    Period  Weeks    Status  Not Met      PT LONG TERM GOAL #4   Title  Patient will report a centralization of neurological symptoms to bilateral knees or higher to decrease bilateral nerve irritation    Baseline  neurological symptoms of numbness and tingling to bilateral feet.    Time  4    Period  Weeks    Status  Not Met            Plan - 12/30/19 1331    Clinical Impression Statement  Patient was able to complete treatment but with ongoing pain. Patient still experiences neurological symptoms down bilateral LEs to the ankles. Patient to be discharged due to lack of progress.    Personal Factors and Comorbidities  Age    Examination-Activity Limitations  Bend;Squat;Sleep;Stand;Stairs;Transfers;Dressing    Examination-Participation Restrictions  Cleaning;Meal Prep    Stability/Clinical Decision Making  Stable/Uncomplicated    Clinical Decision Making  Low    Rehab Potential  Fair    PT Frequency  2x / week    PT Duration  4 weeks    PT Treatment/Interventions  ADLs/Self Care Home Management;Iontophoresis 72m/ml Dexamethasone;Moist Heat;Ultrasound;Traction;Electrical  Stimulation;Cryotherapy;Gait training;Stair training;Functional mobility training;Therapeutic activities;Therapeutic exercise;Passive range of motion;Dry needling;Neuromuscular re-education;Manual techniques;Taping;Patient/family education;Spinal Manipulations    PT Next Visit Plan  DC    PT Home Exercise Plan  see patient education section    Consulted and Agree with Plan of Care  Patient       Patient will benefit from skilled therapeutic intervention in order to improve the following deficits and impairments:  Pain, Postural dysfunction, Difficulty walking, Decreased range of motion, Decreased mobility, Decreased activity tolerance, Decreased strength  Visit Diagnosis: Chronic bilateral low back pain with bilateral sciatica  Abnormal posture  Muscle weakness (generalized)     Problem List Patient Active Problem List   Diagnosis Date Noted  . Spondylolisthesis at L5-S1 level 03/11/2019  . Spondylolysis 03/11/2019  . Chronic bilateral low back pain with bilateral sciatica 03/11/2019  . Obesity (BMI 30.0-34.9) 03/19/2018    KGabriela Eves PT, DPT 12/30/2019, 1:49 PM  [facility] [address] [place] [address]Phone: [phone]  Fax:  [phone] Name: JSaren CorkernMRN: [id number] of Birth: 9July 05, 1992

## 2020-01-08 ENCOUNTER — Telehealth: Payer: Self-pay | Admitting: Specialist

## 2020-01-08 ENCOUNTER — Ambulatory Visit: Payer: Medicaid Other | Admitting: Specialist

## 2020-01-08 NOTE — Telephone Encounter (Signed)
Patient called advised she had to cancel her appointment today because she was sick. Patient said she was to discuss surgery with Dr Otelia Sergeant and asked if she could get a sooner appointment. The number to contact patient is [phone]

## 2020-01-08 NOTE — Telephone Encounter (Signed)
Ms. Kristin Saunders called patient and moved her appt to 01/13/20 @ 845

## 2020-01-13 ENCOUNTER — Ambulatory Visit: Payer: Medicaid Other | Admitting: Specialist

## 2020-01-28 ENCOUNTER — Encounter: Payer: Self-pay | Admitting: Family Medicine

## 2020-01-28 ENCOUNTER — Ambulatory Visit (INDEPENDENT_AMBULATORY_CARE_PROVIDER_SITE_OTHER): Payer: Medicaid Other | Admitting: Family Medicine

## 2020-01-28 DIAGNOSIS — J302 Other seasonal allergic rhinitis: Secondary | ICD-10-CM

## 2020-01-28 MED ORDER — CETIRIZINE HCL 10 MG PO TABS: 10.0000 mg | ORAL_TABLET | Freq: Every day | ORAL | 11 refills | Status: DC

## 2020-01-28 MED ORDER — FLUTICASONE PROPIONATE 50 MCG/ACT NA SUSP: 2.0000 | Freq: Every day | NASAL | 6 refills | Status: DC

## 2020-01-28 NOTE — Progress Notes (Signed)
   Virtual Visit via Telephone Note  I connected with Kristin Saunders on 01/28/20 at 4:56 PM by telephone and verified that I am speaking with the correct person using two identifiers. Kristin Saunders is currently located at home and nobody is currently with her during this visit. The provider, Gwenlyn Fudge, FNP is located in their office at time of visit.  I discussed the limitations, risks, security and privacy concerns of performing an evaluation and management service by telephone and the availability of in person appointments. I also discussed with the patient that there may be a patient responsible charge related to this service. The patient expressed understanding and agreed to proceed.  Subjective: PCP: Junie Spencer, FNP  Chief Complaint  Patient presents with  . Sore Throat  . Otalgia   Patient complains of sore throat and ear pain/pressure. Additional symptoms include runny nose, sneezing and postnasal drainage. Onset of symptoms was 1 day ago, unchanged since that time. She is drinking plenty of fluids. Evaluation to date: none. Treatment to date: cough suppressants and nasal steroids. She does not smoke.    ROS: Per HPI No current outpatient medications on file.  No Known Allergies Past Medical History:  Diagnosis Date  . Medical history non-contributory     Observations/Objective: A&O  No respiratory distress or wheezing audible over the phone Mood, judgement, and thought processes all WNL  Assessment and Plan: 1. Seasonal allergies - fluticasone (FLONASE) 50 MCG/ACT nasal spray; Place 2 sprays into both nostrils daily.  Dispense: 16 g; Refill: 6 - cetirizine (ZYRTEC) 10 MG tablet; Take 1 tablet (10 mg total) by mouth daily.  Dispense: 30 tablet; Refill: 11   Follow Up Instructions:  I discussed the assessment and treatment plan with the patient. The patient was provided an opportunity to ask questions and all were answered. The patient agreed with the plan  and demonstrated an understanding of the instructions.   The patient was advised to call back or seek an in-person evaluation if the symptoms worsen or if the condition fails to improve as anticipated.  The above assessment and management plan was discussed with the patient. The patient verbalized understanding of and has agreed to the management plan. Patient is aware to call the clinic if symptoms persist or worsen. Patient is aware when to return to the clinic for a follow-up visit. Patient educated on when it is appropriate to go to the emergency department.   Time call ended: 5:04 PM  I provided 10 minutes of non-face-to-face time during this encounter.  Deliah Boston, MSN, APRN, FNP-C [facility] 01/28/20

## 2020-03-11 ENCOUNTER — Ambulatory Visit: Payer: Medicaid Other | Admitting: Specialist

## 2020-03-16 DIAGNOSIS — R531 Weakness: Secondary | ICD-10-CM | POA: Diagnosis not present

## 2020-03-16 DIAGNOSIS — R519 Headache, unspecified: Secondary | ICD-10-CM | POA: Diagnosis not present

## 2020-03-23 ENCOUNTER — Other Ambulatory Visit: Payer: Self-pay

## 2020-03-23 ENCOUNTER — Encounter: Payer: Self-pay | Admitting: Family

## 2020-03-23 ENCOUNTER — Ambulatory Visit (INDEPENDENT_AMBULATORY_CARE_PROVIDER_SITE_OTHER): Payer: Medicaid Other | Admitting: Family

## 2020-03-23 VITALS — BP 106/76 | HR 78 | Temp 97.9°F | Ht 66.0 in | Wt 214.2 lb

## 2020-03-23 DIAGNOSIS — E669 Obesity, unspecified: Secondary | ICD-10-CM | POA: Diagnosis not present

## 2020-03-23 DIAGNOSIS — Z09 Encounter for follow-up examination after completed treatment for conditions other than malignant neoplasm: Secondary | ICD-10-CM | POA: Diagnosis not present

## 2020-03-23 DIAGNOSIS — R531 Weakness: Secondary | ICD-10-CM | POA: Diagnosis not present

## 2020-03-23 DIAGNOSIS — F320 Major depressive disorder, single episode, mild: Secondary | ICD-10-CM | POA: Diagnosis not present

## 2020-03-23 DIAGNOSIS — F411 Generalized anxiety disorder: Secondary | ICD-10-CM

## 2020-03-23 DIAGNOSIS — R519 Headache, unspecified: Secondary | ICD-10-CM | POA: Diagnosis not present

## 2020-03-23 MED ORDER — ESCITALOPRAM OXALATE 10 MG PO TABS: 10.0000 mg | ORAL_TABLET | Freq: Every day | ORAL | 3 refills | Status: DC

## 2020-03-23 NOTE — Patient Instructions (Signed)
Major Depressive Disorder, Adult Major depressive disorder (MDD) is a mental health condition. It may also be called clinical depression or unipolar depression. MDD usually causes feelings of sadness, hopelessness, or helplessness. MDD can also cause physical symptoms. It can interfere with work, school, relationships, and other everyday activities. MDD may be mild, moderate, or severe. It may occur once (single episode major depressive disorder) or it may occur multiple times (recurrent major depressive disorder). What are the causes? The exact cause of this condition is not known. MDD is most likely caused by a combination of things, which may include:  Genetic factors. These are traits that are passed along from parent to child.  Individual factors. Your personality, your behavior, and the way you handle your thoughts and feelings may contribute to MDD. This includes personality traits and behaviors learned from others.  Physical factors, such as: ? Differences in the part of your brain that controls emotion. This part of your brain may be different than it is in people who do not have MDD. ? Long-term (chronic) medical or psychiatric illnesses.  Social factors. Traumatic experiences or major life changes may play a role in the development of MDD. What increases the risk? This condition is more likely to develop in women. The following factors may also make you more likely to develop MDD:  A family history of depression.  Troubled family relationships.  Abnormally low levels of certain brain chemicals.  Traumatic events in childhood, especially abuse or the loss of a parent.  Being under a lot of stress, or long-term stress, especially from upsetting life experiences or losses.  A history of: ? Chronic physical illness. ? Other mental health disorders. ? Substance abuse.  Poor living conditions.  Experiencing social exclusion or discrimination on a regular basis. What are the  signs or symptoms? The main symptoms of MDD typically include:  Constant depressed or irritable mood.  Loss of interest in things and activities. MDD symptoms may also include:  Sleeping or eating too much or too little.  Unexplained weight change.  Fatigue or low energy.  Feelings of worthlessness or guilt.  Difficulty thinking clearly or making decisions.  Thoughts of suicide or of harming others.  Physical agitation or weakness.  Isolation. Severe cases of MDD may also occur with other symptoms, such as:  Delusions or hallucinations, in which you imagine things that are not real (psychotic depression).  Low-level depression that lasts at least a year (chronic depression or persistent depressive disorder).  Extreme sadness and hopelessness (melancholic depression).  Trouble speaking and moving (catatonic depression). How is this diagnosed? This condition may be diagnosed based on:  Your symptoms.  Your medical history, including your mental health history. This may involve tests to evaluate your mental health. You may be asked questions about your lifestyle, including any drug and alcohol use, and how long you have had symptoms of MDD.  A physical exam.  Blood tests to rule out other conditions. You must have a depressed mood and at least four other MDD symptoms most of the day, nearly every day in the same 2-week timeframe before your health care provider can confirm a diagnosis of MDD. How is this treated? This condition is usually treated by mental health professionals, such as psychologists, psychiatrists, and clinical social workers. You may need more than one type of treatment. Treatment may include:  Psychotherapy. This is also called talk therapy or counseling. Types of psychotherapy include: ? Cognitive behavioral therapy (CBT). This type of therapy   teaches you to recognize unhealthy feelings, thoughts, and behaviors, and replace them with positive thoughts  and actions. ? Interpersonal therapy (IPT). This helps you to improve the way you relate to and communicate with others. ? Family therapy. This treatment includes members of your family.  Medicine to treat anxiety and depression, or to help you control certain emotions and behaviors.  Lifestyle changes, such as: ? Limiting alcohol and drug use. ? Exercising regularly. ? Getting plenty of sleep. ? Making healthy eating choices. ? Spending more time outdoors.  Treatments involving stimulation of the brain can be used in situations with extremely severe symptoms, or when medicine or other therapies do not work over time. These treatments include electroconvulsive therapy, transcranial magnetic stimulation, and vagal nerve stimulation. Follow these instructions at home: Activity  Return to your normal activities as told by your health care provider.  Exercise regularly and spend time outdoors as told by your health care provider. General instructions  Take over-the-counter and prescription medicines only as told by your health care provider.  Do not drink alcohol. If you drink alcohol, limit your alcohol intake to no more than 1 drink a day for nonpregnant women and 2 drinks a day for men. One drink equals 12 oz of beer, 5 oz of wine, or 1 oz of hard liquor. Alcohol can affect any antidepressant medicines you are taking. Talk to your health care provider about your alcohol use.  Eat a healthy diet and get plenty of sleep.  Find activities that you enjoy doing, and make time to do them.  Consider joining a support group. Your health care provider may be able to recommend a support group.  Keep all follow-up visits as told by your health care provider. This is important. Where to find more information National Alliance on Mental Illness  www.nami.org [place]. National Institute of Mental Health  www.nimh.nih.gov National Suicide Prevention Lifeline  1-800-273-TALK (8255). This is  free, 24-hour help. Contact a health care provider if:  Your symptoms get worse.  You develop new symptoms. Get help right away if:  You self-harm.  You have serious thoughts about hurting yourself or others.  You see, hear, taste, smell, or feel things that are not present (hallucinate). This information is not intended to replace advice given to you by your health care provider. Make sure you discuss any questions you have with your health care provider. Document Revised: 07/20/2017 Document Reviewed: 02/16/2016 Elsevier Patient Education  2020 [employer]  

## 2020-03-23 NOTE — Progress Notes (Signed)
Subjective:    Patient ID: Kristin Saunders, female    DOB: [birth year], 29 y.o.   MRN: [id number]  Chief Complaint  Patient presents with  . Hospitalization Follow-up    weakness     HPI Pt presents to the office today for hospital follow up. She went to the ED on 03/16/20 with weakness and headache. She had a stable CBC, magnesium, CMP, and urinalysis. Her pregnancy urine was negative.   They recommended her see a neurologists.   She also reports her depression is worsening. She feels sad, hopeless, helpless, irritability. She reports this has been going on for years, but over the last month it has worsen.    Review of Systems  All other systems reviewed and are negative.      Objective:   Physical Exam Vitals reviewed.  Constitutional:      General: She is not in acute distress.    Appearance: She is well-developed.  HENT:     Head: Normocephalic and atraumatic.  Eyes:     Pupils: Pupils are equal, round, and reactive to light.  Neck:     Thyroid: No thyromegaly.  Cardiovascular:     Rate and Rhythm: Normal rate and regular rhythm.     Heart sounds: Normal heart sounds. No murmur heard.   Pulmonary:     Effort: Pulmonary effort is normal. No respiratory distress.     Breath sounds: Normal breath sounds. No wheezing.  Abdominal:     General: Bowel sounds are normal. There is no distension.     Palpations: Abdomen is soft.     Tenderness: There is no abdominal tenderness.  Musculoskeletal:        General: No tenderness. Normal range of motion.     Cervical back: Normal range of motion and neck supple.  Skin:    General: Skin is warm and dry.  Neurological:     Mental Status: She is alert and oriented to person, place, and time.     Cranial Nerves: No cranial nerve deficit.     Deep Tendon Reflexes: Reflexes are normal and symmetric.  Psychiatric:        Behavior: Behavior normal.        Thought Content: Thought content normal.        Judgment: Judgment  normal.          BP 106/76   Pulse 78   Temp 97.9 F (36.6 C) (Temporal)   Ht 5\' 6"  (1.676 m)   Wt 214 lb 3.2 oz (97.2 kg)   SpO2 97%   BMI 34.57 kg/m   Assessment & Plan:  Kristin Saunders comes in today with chief complaint of Hospitalization Follow-up (weakness )   Diagnosis and orders addressed:  1. Hospital discharge follow-up - Ambulatory referral to Neurology  2. Depression, major, single episode, mild (HCC) Will start Lexapro 10 mg today Stress management  RTO in 6 weeks to recheck  - escitalopram (LEXAPRO) 10 MG tablet; Take 1 tablet (10 mg total) by mouth daily.  Dispense: 90 tablet; Refill: 3  3. GAD (generalized anxiety disorder) - escitalopram (LEXAPRO) 10 MG tablet; Take 1 tablet (10 mg total) by mouth daily.  Dispense: 90 tablet; Refill: 3  4. Weakness - Ambulatory referral to Neurology  5. Nonintractable headache, unspecified chronicity pattern, unspecified headache type - Ambulatory referral to Neurology  6. Obesity (BMI 30.0-34.9)   Labs reviewed Health Maintenance reviewed Diet and exercise encouraged  Follow up plan: 6 weeks to recheck depression  and GAD   [name], FNP

## 2020-03-31 ENCOUNTER — Other Ambulatory Visit: Payer: Self-pay

## 2020-03-31 ENCOUNTER — Ambulatory Visit: Payer: Self-pay

## 2020-03-31 ENCOUNTER — Ambulatory Visit (INDEPENDENT_AMBULATORY_CARE_PROVIDER_SITE_OTHER): Payer: Medicaid Other | Admitting: Specialist

## 2020-03-31 ENCOUNTER — Encounter: Payer: Self-pay | Admitting: Specialist

## 2020-03-31 VITALS — BP 107/74 | HR 71 | Ht 66.0 in | Wt 214.0 lb

## 2020-03-31 DIAGNOSIS — M48062 Spinal stenosis, lumbar region with neurogenic claudication: Secondary | ICD-10-CM

## 2020-03-31 DIAGNOSIS — M4317 Spondylolisthesis, lumbosacral region: Secondary | ICD-10-CM

## 2020-03-31 MED ORDER — TRAMADOL HCL 50 MG PO TABS: 50.0000 mg | ORAL_TABLET | Freq: Four times a day (QID) | ORAL | 0 refills | Status: DC | PRN

## 2020-03-31 NOTE — Progress Notes (Signed)
Office Visit Note   Patient: Kristin Saunders           Date of Birth: June 22, 1991           MRN: [id number] Visit Date: 03/31/2020              Requested by: Eldred Manges, MD [address] [place],  [place] [address] PCP: Junie Spencer, FNP   Assessment & Plan: Visit Diagnoses:  1. Spondylolisthesis at L5-S1 level     Plan: Avoid bending, stooping and avoid lifting weights greater than 10 lbs. Avoid prolong standing and walking. Order for a new [place] with wheels. Surgery scheduling secretary Tivis Ringer, will call you in the next week to schedule for surgery.  Surgery recommended is a one level lumbar fusion L5-S1 this would be done with rods, screws and cage with local bone graft and allograft (donor bone graft). Take tramadol for for pain. Risk of surgery includes risk of infection 1 in 200 patients, bleeding 1/2% chance you would need a transfusion.   Risk to the nerves is one in 10,000. You will need to use a brace for 3 months and wean from the brace on the 4th month. Expect improved walking and standing tolerance. Expect relief of leg pain but numbness may persist depending on the length and degree of pressure that has been present.    Follow-Up Instructions: No follow-ups on file.   Orders:  Orders Placed This Encounter  Procedures  . XR Lumbar Spine 2-3 Views   No orders of the defined types were placed in this encounter.     Procedures: No procedures performed   Clinical Data: No additional findings.   Subjective: Chief Complaint  Patient presents with  . 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 The pain is worse with standing and walking and standing in one place. She stopped working about September of 2020 due to pain she was experiencing that prevented her standing at her job, she worked there for about 1 1/2 years. Lifting boxes and carrying boxes increased the pain. She was  not seen then but saw Dr. Ophelia Charter in 03/2019 and underwent an MRI that demonstrated pars deffects bilateral L5 with spondylolisthesis. She has had PT for several months and bilateral transforamenal ESIs with no improvement in the pain pattern. Uses an electric cart to be able to grocery shop. Saww Dr. Ophelia Charter again and was referred for assessment and treatment. Right anterior thigh and anterior right leg pain occasionally left but mostly right leg pain with claudication. No bowel or bladder difficulty. She has two children  21 and 99 years old, has mother who helps with child care.   Review of Systems  Constitutional: Positive for activity change and unexpected weight change. Negative for appetite change, chills, diaphoresis, fatigue and fever.  HENT: Negative.  Negative for congestion, dental problem, drooling, ear discharge, ear pain, facial swelling, hearing loss, mouth sores, nosebleeds, postnasal drip, rhinorrhea, sinus pressure, sinus pain, sneezing, sore throat, tinnitus, trouble swallowing and voice change.   Eyes: Negative.  Negative for photophobia, pain, discharge, redness, itching and visual disturbance.  Respiratory: Positive for apnea. Negative for cough, choking, chest tightness, shortness of breath, wheezing and stridor.   Cardiovascular: Positive for palpitations. Negative for chest pain and leg swelling.  Gastrointestinal: Negative.  Negative for abdominal distention, abdominal pain, anal bleeding, blood in stool, constipation, diarrhea, nausea and vomiting.  Endocrine: Negative.  Negative for cold intolerance, heat intolerance, polydipsia, polyphagia and polyuria.  Genitourinary: Negative for difficulty urinating, dyspareunia, dysuria, enuresis, flank pain, frequency and urgency.  Musculoskeletal: Positive for back pain and gait problem. Negative for arthralgias, joint swelling, myalgias, neck pain and neck stiffness.  Skin: Negative.      Objective: Vital Signs: BP 107/74 (BP Location:  Left Arm, Patient Position: Sitting)   Pulse 71   Ht 5\' 6"  (1.676 m)   Wt 214 lb (97.1 kg)   BMI 34.54 kg/m   Physical Exam Constitutional:      Appearance: She is well-developed.  HENT:     Head: Normocephalic and atraumatic.  Eyes:     Pupils: Pupils are equal, round, and reactive to light.  Pulmonary:     Effort: Pulmonary effort is normal.     Breath sounds: Normal breath sounds.  Abdominal:     General: Bowel sounds are normal.     Palpations: Abdomen is soft.  Musculoskeletal:        General: Normal range of motion.     Cervical back: Normal range of motion and neck supple.  Skin:    General: Skin is warm and dry.  Neurological:     Mental Status: She is alert and oriented to person, place, and time.  Psychiatric:        Behavior: Behavior normal.        Thought Content: Thought content normal.        Judgment: Judgment normal.     Back Exam   Tenderness  The patient is experiencing tenderness in the lumbar.  Muscle Strength  Right Quadriceps:  5/5  Left Quadriceps:  5/5  Right Hamstrings:  5/5  Left Hamstrings:  5/5   Reflexes  Patellar: 2/4 Achilles: 2/4 Biceps: 0/4 Babinski's sign: normal   Other  Toe walk: abnormal Heel walk: abnormal Gait: abnormal  Erythema: no back redness Scars: absent      Specialty Comments:  No specialty comments available.  Imaging: No results found.   PMFS History: Patient Active Problem List   Diagnosis Date Noted  . Spondylolisthesis at L5-S1 level 03/11/2019  . Spondylolysis 03/11/2019  . Chronic bilateral low back pain with bilateral sciatica 03/11/2019  . Obesity (BMI 30.0-34.9) 03/19/2018   Past Medical History:  Diagnosis Date  . Medical history non-contributory     Family History  Problem Relation Age of Onset  . Cancer Father        COLON    Past Surgical History:  Procedure Laterality Date  . NO PAST SURGERIES    . TUBAL LIGATION     Social History   Occupational History  . Not  on file  Tobacco Use  . Smoking status: Never Smoker  . Smokeless tobacco: Never Used  Vaping Use  . Vaping Use: Never used  Substance and Sexual Activity  . Alcohol use: Yes    Comment: rare  . Drug use: No  . Sexual activity: Not on file

## 2020-03-31 NOTE — Patient Instructions (Signed)
Plan: Avoid bending, stooping and avoid lifting weights greater than 10 lbs. Avoid prolong standing and walking. Order for a new [place] with wheels. Surgery scheduling secretary Tivis Ringer, will call you in the next week to schedule for surgery.  Surgery recommended is a one level lumbar fusion L5-S1 this would be done with rods, screws and cage with local bone graft and allograft (donor bone graft). Take tramadol for for pain. Risk of surgery includes risk of infection 1 in 200 patients, bleeding 1/2% chance you would need a transfusion.   Risk to the nerves is one in 10,000. You will need to use a brace for 3 months and wean from the brace on the 4th month. Expect improved walking and standing tolerance. Expect relief of leg pain but numbness may persist depending on the length and degree of pressure that has been present.    Follow-Up Instructions: No follow-ups on file.

## 2020-04-29 ENCOUNTER — Ambulatory Visit: Payer: Medicaid Other | Admitting: Specialist

## 2020-05-13 DIAGNOSIS — G4700 Insomnia, unspecified: Secondary | ICD-10-CM | POA: Diagnosis not present

## 2020-05-13 DIAGNOSIS — G47419 Narcolepsy without cataplexy: Secondary | ICD-10-CM | POA: Diagnosis not present

## 2020-05-13 DIAGNOSIS — G4733 Obstructive sleep apnea (adult) (pediatric): Secondary | ICD-10-CM | POA: Diagnosis not present

## 2020-05-13 DIAGNOSIS — G43701 Chronic migraine without aura, not intractable, with status migrainosus: Secondary | ICD-10-CM | POA: Diagnosis not present

## 2020-06-07 ENCOUNTER — Other Ambulatory Visit: Payer: Self-pay

## 2020-06-07 ENCOUNTER — Encounter: Payer: Self-pay | Admitting: Specialist

## 2020-06-07 ENCOUNTER — Ambulatory Visit (INDEPENDENT_AMBULATORY_CARE_PROVIDER_SITE_OTHER): Payer: Medicaid Other | Admitting: Specialist

## 2020-06-07 VITALS — BP 110/77 | HR 94 | Ht 66.0 in | Wt 214.0 lb

## 2020-06-07 DIAGNOSIS — G8929 Other chronic pain: Secondary | ICD-10-CM

## 2020-06-07 DIAGNOSIS — M5441 Lumbago with sciatica, right side: Secondary | ICD-10-CM

## 2020-06-07 DIAGNOSIS — M5442 Lumbago with sciatica, left side: Secondary | ICD-10-CM

## 2020-06-08 ENCOUNTER — Other Ambulatory Visit (HOSPITAL_BASED_OUTPATIENT_CLINIC_OR_DEPARTMENT_OTHER): Payer: Self-pay

## 2020-06-08 DIAGNOSIS — G4733 Obstructive sleep apnea (adult) (pediatric): Secondary | ICD-10-CM

## 2020-06-10 ENCOUNTER — Other Ambulatory Visit: Payer: Self-pay

## 2020-06-10 ENCOUNTER — Encounter: Payer: Self-pay | Admitting: Surgery

## 2020-06-10 ENCOUNTER — Ambulatory Visit (INDEPENDENT_AMBULATORY_CARE_PROVIDER_SITE_OTHER): Payer: Medicaid Other | Admitting: Surgery

## 2020-06-10 VITALS — BP 113/81 | HR 88 | Ht 66.0 in | Wt 214.0 lb

## 2020-06-10 DIAGNOSIS — G8929 Other chronic pain: Secondary | ICD-10-CM | POA: Diagnosis not present

## 2020-06-10 DIAGNOSIS — M5441 Lumbago with sciatica, right side: Secondary | ICD-10-CM | POA: Diagnosis not present

## 2020-06-10 DIAGNOSIS — M5442 Lumbago with sciatica, left side: Secondary | ICD-10-CM | POA: Diagnosis not present

## 2020-06-10 DIAGNOSIS — M4317 Spondylolisthesis, lumbosacral region: Secondary | ICD-10-CM

## 2020-06-10 NOTE — Progress Notes (Signed)
29 year old white female returns with complaints of ongoing low back pain and bilateral lower extremity radiculopathy.  Patient was seen last by Dr. Otelia Sergeant August 2021 and surgical intervention with L5-S1 fusion was discussed.  Patient states that she has been waiting for surgical scheduling.  She has known history of L5-S1 spondylolisthesis.  Continues complain of ongoing low back pain with radiation down to the bilateral buttocks and bilateral lower legs.  Denies bowel or bladder incontinence.  No leg weakness.  Problem is having a significant negative impact on her quality of life.   Exam Very pleasant female alert and oriented in no acute distress.  Gait is antalgic.  Bilateral lumbar paraspinal tenderness.  Negative logroll bilateral hips.  Positive bilateral straight leg raise.  No focal motor deficits.   Plan Surgery scheduler will talk to patient today to discuss scheduling of L5-S1 fusion.  Hopefully we can get this set up for sometime in the near future.  Patient has failed conservative treatment up to this point.

## 2020-06-22 ENCOUNTER — Ambulatory Visit: Payer: Medicaid Other | Attending: Neurology | Admitting: Neurology

## 2020-06-22 ENCOUNTER — Other Ambulatory Visit: Payer: Self-pay

## 2020-06-22 DIAGNOSIS — G4733 Obstructive sleep apnea (adult) (pediatric): Secondary | ICD-10-CM | POA: Diagnosis not present

## 2020-06-22 DIAGNOSIS — R0683 Snoring: Secondary | ICD-10-CM | POA: Insufficient documentation

## 2020-06-23 ENCOUNTER — Other Ambulatory Visit: Payer: Self-pay

## 2020-06-25 DIAGNOSIS — G43701 Chronic migraine without aura, not intractable, with status migrainosus: Secondary | ICD-10-CM | POA: Diagnosis not present

## 2020-06-25 DIAGNOSIS — G4489 Other headache syndrome: Secondary | ICD-10-CM | POA: Diagnosis not present

## 2020-06-25 DIAGNOSIS — G4733 Obstructive sleep apnea (adult) (pediatric): Secondary | ICD-10-CM | POA: Diagnosis not present

## 2020-06-25 DIAGNOSIS — G4700 Insomnia, unspecified: Secondary | ICD-10-CM | POA: Diagnosis not present

## 2020-06-25 DIAGNOSIS — G47419 Narcolepsy without cataplexy: Secondary | ICD-10-CM | POA: Diagnosis not present

## 2020-07-01 NOTE — Procedures (Signed)
  [facility] Saleha Kalp A. Gerilyn Pilgrim, MD     [email or web address]             NOCTURNAL POLYSOMNOGRAPHY   LOCATION: [facility]   Patient Name: Kristin Saunders, Kristin Saunders Date: 06/22/2020 Gender: Female D.O.B: [birth year] Age (years): 29 Referring Provider: Jorge Mandril NP Height (inches): 66 Interpreting Physician: Beryle Beams MD, ABSM Weight (lbs): 260 RPSGT: Alfonso Ellis BMI: 42 MRN: [id number] Neck Size: 15.00 CLINICAL INFORMATION Sleep Study Type: NPSG     Indication for sleep study: N/A     Epworth Sleepiness Score: 10     SLEEP STUDY TECHNIQUE As per the AASM Manual for the Scoring of Sleep and Associated Events v2.3 (April 2016) with a hypopnea requiring 4% desaturations.  The channels recorded and monitored were frontal, central and occipital EEG, electrooculogram (EOG), submentalis EMG (chin), nasal and oral airflow, thoracic and abdominal wall motion, anterior tibialis EMG, snore microphone, electrocardiogram, and pulse oximetry.  MEDICATIONS Medications self-administered by patient taken the night of the study : N/A  Current Outpatient Medications:  .  escitalopram (LEXAPRO) 10 MG tablet, Take 1 tablet (10 mg total) by mouth daily., Disp: 90 tablet, Rfl: 3 .  traMADol (ULTRAM) 50 MG tablet, Take 1 tablet (50 mg total) by mouth every 6 (six) hours as needed., Disp: 30 tablet, Rfl: 0     SLEEP ARCHITECTURE The study was initiated at 10:56:41 PM and ended at 5:53:18 AM.  Sleep onset time was 64.2 minutes and the sleep efficiency was 80.3%%. The total sleep time was 334.4 minutes.  Stage REM latency was 119.5 minutes.  The patient spent 4.6%% of the night in stage N1 sleep, 63.8%% in stage N2 sleep, 20.0%% in stage N3 and 11.5% in REM.  Alpha intrusion was absent.  Supine sleep was 2.78%.  RESPIRATORY PARAMETERS The overall apnea/hypopnea index (AHI) was 1.4 per hour. There were 8 total apneas, including 1 obstructive, 7 central and 0 mixed  apneas. There were 0 hypopneas and 0 RERAs.  The AHI during Stage REM sleep was 0.0 per hour.  AHI while supine was 0.0 per hour.  The mean oxygen saturation was 95.1%. The minimum SpO2 during sleep was 90.0%.  moderate snoring was noted during this study.  CARDIAC DATA The 2 lead EKG demonstrated sinus rhythm. The mean heart rate was 70.8 beats per minute. Other EKG findings include: None.  LEG MOVEMENT DATA The total PLMS were 0 with a resulting PLMS index of 0.0. Associated arousal with leg movement index was 0.0.  IMPRESSIONS No significant obstructive sleep apnea occurred during this study. No significant central sleep apnea occurred during this study.  Argie Ramming, MD Diplomate, American Board of Sleep Medicine.  ELECTRONICALLY SIGNED ON:  07/01/2020, 9:28 AM [facility] PH: [phone]   FX: [phone] ACCREDITED BY THE AMERICAN ACADEMY OF SLEEP MEDICINE

## 2020-07-06 DIAGNOSIS — J029 Acute pharyngitis, unspecified: Secondary | ICD-10-CM | POA: Diagnosis not present

## 2020-07-06 DIAGNOSIS — F32A Depression, unspecified: Secondary | ICD-10-CM | POA: Insufficient documentation

## 2020-07-08 ENCOUNTER — Ambulatory Visit: Payer: Medicaid Other | Admitting: Specialist

## 2020-07-12 ENCOUNTER — Encounter: Payer: Self-pay | Admitting: Specialist

## 2020-07-12 ENCOUNTER — Other Ambulatory Visit: Payer: Self-pay

## 2020-07-12 ENCOUNTER — Ambulatory Visit (INDEPENDENT_AMBULATORY_CARE_PROVIDER_SITE_OTHER): Payer: Medicaid Other | Admitting: Specialist

## 2020-07-12 VITALS — BP 111/75 | HR 77 | Ht 66.0 in | Wt 214.0 lb

## 2020-07-12 DIAGNOSIS — M48062 Spinal stenosis, lumbar region with neurogenic claudication: Secondary | ICD-10-CM

## 2020-07-12 DIAGNOSIS — M4317 Spondylolisthesis, lumbosacral region: Secondary | ICD-10-CM

## 2020-07-12 NOTE — Patient Instructions (Signed)
  Plan: Avoid bending, stooping and avoid lifting weights greater than 10 lbs. Avoid prolong standing and walking. Order for a new [place] with wheels. Surgery scheduling secretary Tivis Ringer, will call you in the next week to schedule for surgery.  Surgery recommended is a one level lumbar fusion L5-S1 this would be done with rods, screws and cage with local bone graft and allograft (donor bone graft). Take tramadol for for pain. Risk of surgery includes risk of infection 1 in 200 patients, bleeding 1/2% chance you would need a transfusion.   Risk to the nerves is one in 10,000. You will need to use a brace for 3 months and wean from the brace on the 4th month. Expect improved walking and standing tolerance. Expect relief of leg pain but numbness may persist depending on the length and degree of pressure that has been present.

## 2020-07-12 NOTE — Progress Notes (Signed)
Office Visit Note   Patient: Kristin Saunders           Date of Birth: [birth year]           MRN: [id number] Visit Date: 07/12/2020              Requested by: Junie Spencer, FNP [address] [place],  [place] [address] PCP: Junie Spencer, FNP   Assessment & Plan: Visit Diagnoses:  1. Spondylolisthesis at L5-S1 level   2. Spinal stenosis of lumbar region with neurogenic claudication      Plan: Avoid bending, stooping and avoid lifting weights greater than 10 lbs. Avoid prolong standing and walking. Order for a new [place] with wheels. Surgery scheduling secretary Tivis Ringer, will call you in the next week to schedule for surgery.  Surgery recommended is a one level lumbar fusion L5-S1 this would be done with rods, screws and cage with local bone graft and allograft (donor bone graft). Take tramadol for for pain. Risk of surgery includes risk of infection 1 in 200 patients, bleeding 1/2% chance you would need a transfusion.   Risk to the nerves is one in 10,000. You will need to use a brace for 3 months and wean from the brace on the 4th month. Expect improved walking and standing tolerance. Expect relief of leg pain but numbness may persist depending on the length and degree of pressure that has been present.  Follow-Up Instructions: Return in about 4 weeks (around 08/09/2020).   Orders:  No orders of the defined types were placed in this encounter.  No orders of the defined types were placed in this encounter.     Procedures: No procedures performed   Clinical Data: No additional findings.   Subjective: Chief Complaint  Patient presents with  . Lower Back - Follow-up    Surgery sched for 07/20/20 TLIF L5-S1    HPI  Review of Systems  Constitutional: Negative.   HENT: Negative.   Eyes: Negative.   Respiratory: Negative.   Cardiovascular: Negative.   Gastrointestinal: Negative.   Endocrine: Negative.   Genitourinary: Negative.     Musculoskeletal: Negative.   Skin: Negative.   Allergic/Immunologic: Negative.   Neurological: Negative.   Hematological: Negative.   Psychiatric/Behavioral: Negative.      Objective: Vital Signs: BP 111/75 (BP Location: Left Arm, Patient Position: Sitting)   Pulse 77   Ht 5\' 6"  (1.676 m)   Wt 214 lb (97.1 kg)   BMI 34.54 kg/m   Physical Exam Constitutional:      Appearance: She is well-developed.  HENT:     Head: Normocephalic and atraumatic.  Eyes:     Pupils: Pupils are equal, round, and reactive to light.  Pulmonary:     Effort: Pulmonary effort is normal.     Breath sounds: Normal breath sounds.  Abdominal:     General: Bowel sounds are normal.     Palpations: Abdomen is soft.  Musculoskeletal:     Cervical back: Normal range of motion and neck supple.  Skin:    General: Skin is warm and dry.  Neurological:     Mental Status: She is alert and oriented to person, place, and time.  Psychiatric:        Behavior: Behavior normal.        Thought Content: Thought content normal.        Judgment: Judgment normal.     Back Exam   Tenderness  The patient is experiencing tenderness in  the lumbar.  Range of Motion  Extension: abnormal  Flexion: abnormal  Lateral bend right: abnormal  Lateral bend left: abnormal  Rotation right: abnormal  Rotation left: abnormal   Muscle Strength  Right Quadriceps:  5/5  Left Quadriceps:  5/5  Right Hamstrings:  5/5  Left Hamstrings:  5/5   Reflexes  Patellar: 2/4 Achilles: 0/4 Biceps: 0/4 Babinski's sign: normal       Specialty Comments:  No specialty comments available.  Imaging: No results found.   PMFS History: Patient Active Problem List   Diagnosis Date Noted  . Spondylolisthesis at L5-S1 level 03/11/2019  . Spondylolysis 03/11/2019  . Chronic bilateral low back pain with bilateral sciatica 03/11/2019  . Obesity (BMI 30.0-34.9) 03/19/2018   Past Medical History:  Diagnosis Date  . Medical  history non-contributory     Family History  Problem Relation Age of Onset  . Cancer Father        COLON    Past Surgical History:  Procedure Laterality Date  . NO PAST SURGERIES    . TUBAL LIGATION     Social History   Occupational History  . Not on file  Tobacco Use  . Smoking status: Never Smoker  . Smokeless tobacco: Never Used  Vaping Use  . Vaping Use: Never used  Substance and Sexual Activity  . Alcohol use: Yes    Comment: rare  . Drug use: No  . Sexual activity: Not on file

## 2020-07-14 ENCOUNTER — Ambulatory Visit (INDEPENDENT_AMBULATORY_CARE_PROVIDER_SITE_OTHER): Payer: Medicaid Other | Admitting: Surgery

## 2020-07-14 ENCOUNTER — Other Ambulatory Visit: Payer: Self-pay

## 2020-07-14 ENCOUNTER — Encounter: Payer: Self-pay | Admitting: Surgery

## 2020-07-14 VITALS — BP 111/76 | HR 76 | Ht 66.0 in | Wt 214.0 lb

## 2020-07-14 DIAGNOSIS — M4317 Spondylolisthesis, lumbosacral region: Secondary | ICD-10-CM

## 2020-07-14 DIAGNOSIS — M48062 Spinal stenosis, lumbar region with neurogenic claudication: Secondary | ICD-10-CM

## 2020-07-14 NOTE — Progress Notes (Signed)
29 year old white female with history of L5-S1 spondylolisthesis, HNP/stenosis, low back pain and bilateral lower extremity radiculopathy comes in for preop evaluation.  States symptoms unchanged from previous visit.  She is wanting to proceed with L5-S1 fusion as scheduled.  Patient states that Dr. Otelia Sergeant previously discussed surgical procedure very thoroughly last office visit with him.  Today history and physical performed.  Review of systems negative.  All questions answered.

## 2020-07-16 NOTE — Progress Notes (Signed)
Your procedure is scheduled on Tuesday, November 30th.  Report to [facility] Main Entrance "A" at 5:30 A.M., and check in at the Admitting office.  Call this number if you have problems the morning of surgery:  [phone]  Call [phone] if you have any questions prior to your surgery date Monday-Friday 8am-4pm   Remember:  Do not eat or drink after midnight the night before your surgery   Take these medicines the morning of surgery with A SIP OF WATER  cetirizine (ZYRTEC)  escitalopram (LEXAPRO)  As of today, STOP taking any Aspirin (unless otherwise instructed by your surgeon) Aleve, Naproxen, Ibuprofen, Motrin, Advil, Goody's, BC's, all herbal medications, fish oil, and all vitamins.                     Do not wear jewelry, make up, or nail polish            Do not wear lotions, powders, perfumes, or deodorant.            Do not shave 48 hours prior to surgery.            Do not bring valuables to the hospital.            [facility] is not responsible for any belongings or valuables.  Do NOT Smoke (Tobacco/Vaping) or drink Alcohol 24 hours prior to your procedure If you use a CPAP at night, you may bring all equipment for your overnight stay.   Contacts, glasses, dentures or bridgework may not be worn into surgery.      For patients admitted to the hospital, discharge time will be determined by your treatment team.   Patients discharged the day of surgery will not be allowed to drive home, and someone needs to stay with them for 24 hours.  Special instructions:   [facility]- Preparing For Surgery  Before surgery, you can play an important role. Because skin is not sterile, your skin needs to be as free of germs as possible. You can reduce the number of germs on your skin by washing with CHG (chlorahexidine gluconate) Soap before surgery.  CHG is an antiseptic cleaner which kills germs and bonds with the skin to continue killing germs even after washing.    Oral Hygiene  is also important to reduce your risk of infection.  Remember - BRUSH YOUR TEETH THE MORNING OF SURGERY WITH YOUR REGULAR TOOTHPASTE  Please do not use if you have an allergy to CHG or antibacterial soaps. If your skin becomes reddened/irritated stop using the CHG.  Do not shave (including legs and underarms) for at least 48 hours prior to first CHG shower. It is OK to shave your face.  Please follow these instructions carefully.   1. Shower the NIGHT BEFORE SURGERY and the MORNING OF SURGERY with CHG Soap.   2. If you chose to wash your hair, wash your hair first as usual with your normal shampoo.  3. After you shampoo, rinse your hair and body thoroughly to remove the shampoo.  4. Use CHG as you would any other liquid soap. You can apply CHG directly to the skin and wash gently with a scrungie or a clean washcloth.   5. Apply the CHG Soap to your body ONLY FROM THE NECK DOWN.  Do not use on open wounds or open sores. Avoid contact with your eyes, ears, mouth and genitals (private parts). Wash Face and genitals (private parts)  with your normal soap.  6. Wash thoroughly, paying special attention to the area where your surgery will be performed.  7. Thoroughly rinse your body with warm water from the neck down.  8. DO NOT shower/wash with your normal soap after using and rinsing off the CHG Soap.  9. Pat yourself dry with a CLEAN TOWEL.  10. Wear CLEAN PAJAMAS to bed the night before surgery  11. Place CLEAN SHEETS on your bed the night of your first shower and DO NOT SLEEP WITH PETS.  Day of Surgery: Wear Clean/Comfortable clothing the morning of surgery Do not apply any deodorants/lotions.   Remember to brush your teeth WITH YOUR REGULAR TOOTHPASTE.   Please read over the following fact sheets that you were given.

## 2020-07-19 ENCOUNTER — Encounter (HOSPITAL_COMMUNITY): Payer: Self-pay | Admitting: Anesthesiology

## 2020-07-19 ENCOUNTER — Inpatient Hospital Stay (HOSPITAL_COMMUNITY)
Admission: RE | Admit: 2020-07-19 | Discharge: 2020-07-19 | Disposition: A | Discharge status: Home / Self Care | Payer: Medicaid Other | Source: Ambulatory Visit

## 2020-07-19 ENCOUNTER — Other Ambulatory Visit (HOSPITAL_COMMUNITY)
Admission: RE | Admit: 2020-07-19 | Discharge: 2020-07-19 | Disposition: A | Discharge status: Home / Self Care | Payer: Medicaid Other | Source: Ambulatory Visit | Attending: Specialist | Admitting: Specialist

## 2020-07-19 ENCOUNTER — Other Ambulatory Visit: Payer: Self-pay

## 2020-07-19 ENCOUNTER — Encounter (HOSPITAL_COMMUNITY): Payer: Self-pay

## 2020-07-19 DIAGNOSIS — Z01812 Encounter for preprocedural laboratory examination: Secondary | ICD-10-CM | POA: Diagnosis not present

## 2020-07-19 DIAGNOSIS — Z20822 Contact with and (suspected) exposure to covid-19: Secondary | ICD-10-CM | POA: Diagnosis not present

## 2020-07-19 HISTORY — DX: Gastro-esophageal reflux disease without esophagitis: K21.9

## 2020-07-19 HISTORY — DX: Headache, unspecified: R51.9

## 2020-07-19 HISTORY — DX: Anxiety disorder, unspecified: F41.9

## 2020-07-19 HISTORY — DX: Depression, unspecified: F32.A

## 2020-07-19 NOTE — Anesthesia Preprocedure Evaluation (Deleted)
Anesthesia Evaluation    Airway        Dental   Pulmonary           Cardiovascular      Neuro/Psych  Headaches, Anxiety Depression  Neuromuscular disease    GI/Hepatic GERD  ,  Endo/Other    Renal/GU      Musculoskeletal   Abdominal   Peds  Hematology   Anesthesia Other Findings   Reproductive/Obstetrics                             Anesthesia Physical Anesthesia Plan  ASA: III  Anesthesia Plan: General   Post-op Pain Management:    Induction: Intravenous  PONV Risk Score and Plan:   Airway Management Planned: Oral ETT  Additional Equipment:   Intra-op Plan:   Post-operative Plan:   Informed Consent:   Plan Discussed with: Anesthesiologist  Anesthesia Plan Comments:         Anesthesia Quick Evaluation

## 2020-07-19 NOTE — Progress Notes (Signed)
Your procedure is scheduled on Tuesday, November 30th.  Report to [facility] Main Entrance "A" at 5:30 A.M., and check in at the Admitting office.  Call this number if you have problems the morning of surgery:  [phone]  Call [phone] if you have any questions prior to your surgery date Monday-Friday 8am-4pm   Remember:  Do not eat or drink after midnight the night before your surgery   Take these medicines the morning of surgery with A SIP OF WATER:  amoxicillin (AMOXIL)  cetirizine (ZYRTEC)  escitalopram (LEXAPRO)  As of today, STOP taking any Aspirin (unless otherwise instructed by your surgeon) Aleve, Naproxen, Ibuprofen, Motrin, Advil, Goody's, BC's, all herbal medications, fish oil, and all vitamins.                     Do not wear jewelry, make up, or nail polish            Do not wear lotions, powders, perfumes, or deodorant.            Do not shave 48 hours prior to surgery.            Do not bring valuables to the hospital.            [facility] is not responsible for any belongings or valuables.  Do NOT Smoke (Tobacco/Vaping) or drink Alcohol 24 hours prior to your procedure If you use a CPAP at night, you may bring all equipment for your overnight stay.   Contacts, glasses, dentures or bridgework may not be worn into surgery.      For patients admitted to the hospital, discharge time will be determined by your treatment team.   Patients discharged the day of surgery will not be allowed to drive home, and someone needs to stay with them for 24 hours.  Special instructions:   [place]- Preparing For Surgery  Before surgery, you can play an important role. Because skin is not sterile, your skin needs to be as free of germs as possible. You can reduce the number of germs on your skin by washing with CHG (chlorahexidine gluconate) Soap before surgery.  CHG is an antiseptic cleaner which kills germs and bonds with the skin to continue killing germs even after  washing.    Oral Hygiene is also important to reduce your risk of infection.  Remember - BRUSH YOUR TEETH THE MORNING OF SURGERY WITH YOUR REGULAR TOOTHPASTE  Please do not use if you have an allergy to CHG or antibacterial soaps. If your skin becomes reddened/irritated stop using the CHG.  Do not shave (including legs and underarms) for at least 48 hours prior to first CHG shower. It is OK to shave your face.  Please follow these instructions carefully.   1. Shower the NIGHT BEFORE SURGERY and the MORNING OF SURGERY with CHG Soap.   2. If you chose to wash your hair, wash your hair first as usual with your normal shampoo.  3. After you shampoo, rinse your hair and body thoroughly to remove the shampoo.  4. Use CHG as you would any other liquid soap. You can apply CHG directly to the skin and wash gently with a scrungie or a clean washcloth.   5. Apply the CHG Soap to your body ONLY FROM THE NECK DOWN.  Do not use on open wounds or open sores. Avoid contact with your eyes, ears, mouth and genitals (private parts). Wash Face and genitals (private parts)  with  your normal soap.   6. Wash thoroughly, paying special attention to the area where your surgery will be performed.  7. Thoroughly rinse your body with warm water from the neck down.  8. DO NOT shower/wash with your normal soap after using and rinsing off the CHG Soap.  9. Pat yourself dry with a CLEAN TOWEL.  10. Wear CLEAN PAJAMAS to bed the night before surgery  11. Place CLEAN SHEETS on your bed the night of your first shower and DO NOT SLEEP WITH PETS.  Day of Surgery: Wear Clean/Comfortable clothing the morning of surgery Do not apply any deodorants/lotions.   Remember to brush your teeth WITH YOUR REGULAR TOOTHPASTE.   Please read over the following fact sheets that you were given.

## 2020-07-19 NOTE — Progress Notes (Addendum)
Your procedure is scheduled on Tuesday, November 30th.  Report to [facility] Main Entrance "A" at 5:30 A.M., and check in at the Admitting office.  Call this number if you have problems the morning of surgery:  [phone]  Call [phone] if you have any questions prior to your surgery date Monday-Friday 8am-4pm   Remember:  Do not eat or drink after midnight the night before your surgery   Take these medicines the morning of surgery with A SIP OF WATER:  cetirizine (ZYRTEC)  escitalopram (LEXAPRO)  As of today, STOP taking any Aspirin (unless otherwise instructed by your surgeon) Aleve, Naproxen, Ibuprofen, Motrin, Advil, Goody's, BC's, all herbal medications, fish oil, and all vitamins.                     Do not wear jewelry, make up, or nail polish            Do not wear lotions, powders, perfumes, or deodorant.            Do not shave 48 hours prior to surgery.            Do not bring valuables to the hospital.            [facility] is not responsible for any belongings or valuables.  Do NOT Smoke (Tobacco/Vaping) or drink Alcohol 24 hours prior to your procedure If you use a CPAP at night, you may bring all equipment for your overnight stay.   Contacts, glasses, dentures or bridgework may not be worn into surgery.      For patients admitted to the hospital, discharge time will be determined by your treatment team.   Patients discharged the day of surgery will not be allowed to drive home, and someone needs to stay with them for 24 hours.   Oral Hygiene is also important to reduce your risk of infection.  Remember - BRUSH YOUR TEETH THE MORNING OF SURGERY WITH YOUR REGULAR TOOTHPASTE  Please follow these instructions carefully.   Shower the NIGHT BEFORE SURGERY and the MORNING OF SURGERY with mild Soap.    Patient did not arrive to her PAT appt today, 07/19/20. Patient agreed to a SDW phone call. Patient given all necessary pre-op instructions. Still no pre-op  orders. Patient instructed to still get COVID test done today. Patient verbalized understanding. All other labs to be collected DOS.

## 2020-07-20 ENCOUNTER — Inpatient Hospital Stay (HOSPITAL_COMMUNITY): Admission: RE | Admit: 2020-07-20 | Payer: Medicaid Other | Source: Home / Self Care | Admitting: Specialist

## 2020-07-20 ENCOUNTER — Encounter (HOSPITAL_COMMUNITY): Admission: RE | Payer: Self-pay | Source: Home / Self Care

## 2020-07-20 DIAGNOSIS — R0789 Other chest pain: Secondary | ICD-10-CM | POA: Diagnosis not present

## 2020-07-20 DIAGNOSIS — R1111 Vomiting without nausea: Secondary | ICD-10-CM | POA: Diagnosis not present

## 2020-07-20 DIAGNOSIS — J069 Acute upper respiratory infection, unspecified: Secondary | ICD-10-CM | POA: Diagnosis not present

## 2020-07-20 DIAGNOSIS — R112 Nausea with vomiting, unspecified: Secondary | ICD-10-CM | POA: Diagnosis not present

## 2020-07-20 DIAGNOSIS — R059 Cough, unspecified: Secondary | ICD-10-CM | POA: Diagnosis not present

## 2020-07-20 DIAGNOSIS — B9789 Other viral agents as the cause of diseases classified elsewhere: Secondary | ICD-10-CM | POA: Diagnosis not present

## 2020-07-20 DIAGNOSIS — R079 Chest pain, unspecified: Secondary | ICD-10-CM | POA: Diagnosis not present

## 2020-07-20 LAB — SARS CORONAVIRUS 2 (TAT 6-24 HRS): SARS Coronavirus 2: NEGATIVE

## 2020-07-20 SURGERY — POSTERIOR LUMBAR FUSION 1 LEVEL
Anesthesia: General

## 2020-07-20 NOTE — H&P (Signed)
Kristin Saunders is an 29 y.o. female.   Chief Complaint: low back and bilat LE radiculopathy HPI: 29 year old white female with history of L5-S1 spondylolisthesis, HNP/stenosis, low back pain and bilateral lower extremity radiculopathy comes in for preop evaluation.  States symptoms unchanged from previous visit.  Past Medical History:  Diagnosis Date  . Anxiety   . Depression   . GERD (gastroesophageal reflux disease)   . Headache   . Medical history non-contributory     Past Surgical History:  Procedure Laterality Date  . NO PAST SURGERIES    . TUBAL LIGATION      Family History  Problem Relation Age of Onset  . Cancer Father        COLON   Social History:  reports that she has never smoked. She has never used smokeless tobacco. She reports current alcohol use. She reports that she does not use drugs.  Allergies: No Known Allergies  No medications prior to admission.    Results for orders placed or performed during the hospital encounter of 07/19/20 (from the past 48 hour(s))  SARS CORONAVIRUS 2 (TAT 6-24 HRS) Nasopharyngeal Nasopharyngeal Swab     Status: None   Collection Time: 07/19/20  1:14 PM   Specimen: Nasopharyngeal Swab  Result Value Ref Range   SARS Coronavirus 2 NEGATIVE NEGATIVE    Comment: (NOTE) SARS-CoV-2 target nucleic acids are NOT DETECTED.  The SARS-CoV-2 RNA is generally detectable in upper and lower respiratory specimens during the acute phase of infection. Negative results do not preclude SARS-CoV-2 infection, do not rule out co-infections with other pathogens, and should not be used as the sole basis for treatment or other patient management decisions. Negative results must be combined with clinical observations, patient history, and epidemiological information. The expected result is Negative.  Fact Sheet for Patients: HairSlick.no  Fact Sheet for Healthcare  Providers: quierodirigir.com  This test is not yet approved or cleared by the [place] FDA and  has been authorized for detection and/or diagnosis of SARS-CoV-2 by FDA under an Emergency Use Authorization (EUA). This EUA will remain  in effect (meaning this test can be used) for the duration of the COVID-19 declaration under Se ction 564(b)(1) of the Act, 21 U.S.C. section 360bbb-3(b)(1), unless the authorization is terminated or revoked sooner.  Performed at [facility], [address] [place], [place] [address]    No results found.  Review of Systems  Constitutional: Positive for activity change.  HENT: Negative.   Respiratory: Negative.   Cardiovascular: Negative.   Genitourinary: Negative.   Musculoskeletal: Positive for back pain.  Neurological: Positive for numbness.    Last menstrual period 07/12/2020, unknown if currently breastfeeding. Physical Exam HENT:     Head: Normocephalic and atraumatic.  Eyes:     Extraocular Movements: Extraocular movements intact.     Pupils: Pupils are equal, round, and reactive to light.  Cardiovascular:     Rate and Rhythm: Regular rhythm.     Heart sounds: Normal heart sounds.  Pulmonary:     Effort: Pulmonary effort is normal. No respiratory distress.     Breath sounds: Normal breath sounds.  Musculoskeletal:        General: Tenderness present.  Neurological:     Mental Status: She is alert and oriented to person, place, and time.  Psychiatric:        Mood and Affect: Mood normal.      Assessment/Plan  L5-S1 spondylolisthesis/HNP, low back pain and bilat LE radiculopathy  We will proceed with TRANSFORAMINAL LUMBAR INTERBODY FUSION L5-S1 WITH SCREWS, RODS AND CAGES, VIVIGEN, LOCAL BONE GRAFT, ALLOGRAFT BONE GRAFT as scheduled.  All questions answered   Zonia Kief, PA-C 07/20/2020, 3:53 AM

## 2020-07-21 ENCOUNTER — Telehealth: Payer: Self-pay

## 2020-07-21 NOTE — Telephone Encounter (Signed)
Transition Care Management Unsuccessful Follow-up Telephone Call  Date of discharge and from where:  07/20/2020 [facility]  Attempts:  1st Attempt  Reason for unsuccessful TCM follow-up call:  No answer/busy    

## 2020-07-22 DIAGNOSIS — R0981 Nasal congestion: Secondary | ICD-10-CM | POA: Diagnosis not present

## 2020-07-22 DIAGNOSIS — N3001 Acute cystitis with hematuria: Secondary | ICD-10-CM | POA: Diagnosis not present

## 2020-07-22 DIAGNOSIS — R8271 Bacteriuria: Secondary | ICD-10-CM | POA: Diagnosis not present

## 2020-07-22 DIAGNOSIS — R509 Fever, unspecified: Secondary | ICD-10-CM | POA: Diagnosis not present

## 2020-07-22 DIAGNOSIS — R112 Nausea with vomiting, unspecified: Secondary | ICD-10-CM | POA: Diagnosis not present

## 2020-07-22 DIAGNOSIS — J0100 Acute maxillary sinusitis, unspecified: Secondary | ICD-10-CM | POA: Diagnosis not present

## 2020-07-22 NOTE — Telephone Encounter (Signed)
Transition Care Management Unsuccessful Follow-up Telephone Call  Date of discharge and from where:  07/20/2020 [facility]  Attempts:  2nd Attempt  Reason for unsuccessful TCM follow-up call:  Unable to leave message

## 2020-07-23 NOTE — Telephone Encounter (Signed)
Transition Care Management Follow-up Telephone Call  Date of discharge and from where:07/20/2020 [facility]   How have you been since you were released from the hospital? Doing better, went to urgent care yesterday and was prescribed some antibiotics.   Any questions or concerns? No  Items Reviewed:  Did the pt receive and understand the discharge instructions provided? Yes   Medications obtained and verified? Yes   Other? No   Any new allergies since your discharge? No   Dietary orders reviewed? Yes  Do you have support at home? Yes   Home Care and Equipment/Supplies: Were home health services ordered? not applicable If so, what is the name of the agency? na  Has the agency set up a time to come to the patient's home? not applicable Were any new equipment or medical supplies ordered?  No What is the name of the medical supply agency? na Were you able to get the supplies/equipment? not applicable Do you have any questions related to the use of the equipment or supplies? No  Functional Questionnaire: (I = Independent and D = Dependent) ADLs: I  Bathing/Dressing- I  Meal Prep- I  Eating- I  Maintaining continence- I  Transferring/Ambulation- I  Managing Meds- I  Follow up appointments reviewed:   PCP Hospital f/u appt confirmed? No  Patient stated she will finished round of antibiotics and then contact PCP office for an appointment.   Specialist Hospital f/u appt confirmed? No    Are transportation arrangements needed? No   If their condition worsens, is the pt aware to call PCP or go to the Emergency Dept.? Yes  Was the patient provided with contact information for the PCP's office or ED? Yes  Was to pt encouraged to call back with questions or concerns? Yes

## 2020-08-12 ENCOUNTER — Encounter: Payer: Self-pay | Admitting: Surgery

## 2020-08-12 ENCOUNTER — Ambulatory Visit (INDEPENDENT_AMBULATORY_CARE_PROVIDER_SITE_OTHER): Payer: Medicaid Other | Admitting: Surgery

## 2020-08-12 VITALS — BP 113/77 | HR 75 | Ht 66.0 in | Wt 218.2 lb

## 2020-08-12 DIAGNOSIS — M48062 Spinal stenosis, lumbar region with neurogenic claudication: Secondary | ICD-10-CM

## 2020-08-12 DIAGNOSIS — M4317 Spondylolisthesis, lumbosacral region: Secondary | ICD-10-CM

## 2020-08-12 NOTE — Progress Notes (Signed)
29 year old white female history of L5-S1 spondylolisthesis, HNP/stenosis comes in for preop evaluation. States that symptoms of low back pain and bilateral lower extremity radiculopathy unchanged from previous visit. She is wanting to proceed with L5-S1 transforaminal lumbar interbody fusion as scheduled August 24, 2020. Patient was previously on the schedule for surgery July 20, 2020 but canceled due to complaint of chest pain. Patient was seen in the ED July 20, 2020 and diagnosed with a URI. Started on antibiotic. She was then seen a couple days later at an urgent care and diagnosed with a UTI. Today patient denies any complaints of fever chills pulmonary GI GU issues. Patient's preop lab appointment is scheduled for January 3 and we will see if our surgery scheduler can get this moved up sooner. All questions answered.

## 2020-08-18 ENCOUNTER — Other Ambulatory Visit: Payer: Self-pay

## 2020-08-19 NOTE — Progress Notes (Signed)
[facility] [address] [place] - [address] [place] [address] Phone: [phone] Fax: [phone]      Your procedure is scheduled on 08/24/2020.  Report to [facility] Main Entrance "A" at 05:30 A.M., and check in at the Admitting office.  Call this number if you have problems the morning of surgery:  [phone]  Call [phone] if you have any questions prior to your surgery date Monday-Friday 8am-4pm    Remember:  Do not eat after midnight the night before your surgery  You may drink clear liquids until 04:30am the morning of your surgery.   Clear liquids allowed are: Water, Non-Citrus Juices (without pulp), Carbonated Beverages, Clear Tea, Black Coffee Only, and Gatorade.  Patient Instructions  . The night before surgery:  o No food after midnight. ONLY clear liquids after midnight  . The day of surgery (if you do NOT have diabetes):  o Drink ONE (1) Pre-Surgery Clear Ensure by 4:30am the morning of surgery. Please make the ensure the last thing you drink. Drink in one sitting. Do not sip.   o This drink was given to you during your hospital  pre-op appointment visit. o Nothing else to drink after completing the  Pre-Surgery Clear Ensure.     Take these medicines the morning of surgery with A SIP OF WATER    cetirizine (ZYRTEC)   escitalopram (LEXAPRO)   fluticasone (FLONASE)  As of today, STOP taking any Aspirin (unless otherwise instructed by your surgeon) Aleve, Naproxen, Ibuprofen, Motrin, Advil, Goody's, BC's, all herbal medications, fish oil, and all vitamins.                      Do not wear jewelry, make up, or nail polish            Do not wear lotions, powders, perfumes/colognes, or deodorant.            Do not shave 48 hours prior to surgery.  Men may shave face and neck.            Do not bring valuables to the hospital.            [facility] is not responsible for any belongings or valuables.  Do NOT Smoke  (Tobacco/Vaping) or drink Alcohol 24 hours prior to your procedure If you use a CPAP at night, you may bring all equipment for your overnight stay.   Contacts, glasses, dentures or bridgework may not be worn into surgery.      For patients admitted to the hospital, discharge time will be determined by your treatment team.   Patients discharged the day of surgery will not be allowed to drive home, and someone needs to stay with them for 24 hours.    Special instructions:   [place]- Preparing For Surgery  Before surgery, you can play an important role. Because skin is not sterile, your skin needs to be as free of germs as possible. You can reduce the number of germs on your skin by washing with CHG (chlorahexidine gluconate) Soap before surgery.  CHG is an antiseptic cleaner which kills germs and bonds with the skin to continue killing germs even after washing.    Oral Hygiene is also important to reduce your risk of infection.  Remember - BRUSH YOUR TEETH THE MORNING OF SURGERY WITH YOUR REGULAR TOOTHPASTE  Please do not use if you have an allergy to CHG or antibacterial soaps. If  your skin becomes reddened/irritated stop using the CHG.  Do not shave (including legs and underarms) for at least 48 hours prior to first CHG shower. It is OK to shave your face.  Please follow these instructions carefully.   1. Shower the NIGHT BEFORE SURGERY and the MORNING OF SURGERY with CHG Soap.   2. If you chose to wash your hair, wash your hair first as usual with your normal shampoo.  3. After you shampoo, rinse your hair and body thoroughly to remove the shampoo.  4. Use CHG as you would any other liquid soap. You can apply CHG directly to the skin and wash gently with a scrungie or a clean washcloth.   5. Apply the CHG Soap to your body ONLY FROM THE NECK DOWN.  Do not use on open wounds or open sores. Avoid contact with your eyes, ears, mouth and genitals (private parts). Wash Face and  genitals (private parts)  with your normal soap.   6. Wash thoroughly, paying special attention to the area where your surgery will be performed.  7. Thoroughly rinse your body with warm water from the neck down.  8. DO NOT shower/wash with your normal soap after using and rinsing off the CHG Soap.  9. Pat yourself dry with a CLEAN TOWEL.  10. Wear CLEAN PAJAMAS to bed the night before surgery  11. Place CLEAN SHEETS on your bed the night of your first shower and DO NOT SLEEP WITH PETS.   Day of Surgery: Wear Clean/Comfortable clothing the morning of surgery Do not apply any deodorants/lotions.   Remember to brush your teeth WITH YOUR REGULAR TOOTHPASTE.   Please read over the following fact sheets that you were given.

## 2020-08-23 ENCOUNTER — Encounter (HOSPITAL_COMMUNITY)
Admission: RE | Admit: 2020-08-23 | Discharge: 2020-08-23 | Disposition: A | Discharge status: Home / Self Care | Payer: Medicaid Other | Source: Ambulatory Visit | Attending: Specialist | Admitting: Specialist

## 2020-08-23 ENCOUNTER — Encounter (HOSPITAL_COMMUNITY): Payer: Self-pay

## 2020-08-23 ENCOUNTER — Other Ambulatory Visit (HOSPITAL_COMMUNITY)
Admission: RE | Admit: 2020-08-23 | Discharge: 2020-08-23 | Disposition: A | Discharge status: Home / Self Care | Payer: Medicaid Other | Source: Ambulatory Visit | Attending: Specialist | Admitting: Specialist

## 2020-08-23 ENCOUNTER — Other Ambulatory Visit: Payer: Self-pay

## 2020-08-23 DIAGNOSIS — Z01812 Encounter for preprocedural laboratory examination: Secondary | ICD-10-CM | POA: Insufficient documentation

## 2020-08-23 DIAGNOSIS — Z20822 Contact with and (suspected) exposure to covid-19: Secondary | ICD-10-CM | POA: Insufficient documentation

## 2020-08-23 DIAGNOSIS — M4300 Spondylolysis, site unspecified: Secondary | ICD-10-CM | POA: Diagnosis not present

## 2020-08-23 DIAGNOSIS — M4317 Spondylolisthesis, lumbosacral region: Secondary | ICD-10-CM | POA: Diagnosis not present

## 2020-08-23 LAB — CBC
HCT: 45.2 % (ref 36.0–46.0)
Hemoglobin: 14.6 g/dL (ref 12.0–15.0)
MCH: 31 pg (ref 26.0–34.0)
MCHC: 32.3 g/dL (ref 30.0–36.0)
MCV: 96 fL (ref 80.0–100.0)
Platelets: 265 10*3/uL (ref 150–400)
RBC: 4.71 MIL/uL (ref 3.87–5.11)
RDW: 12.6 % (ref 11.5–15.5)
WBC: 5.6 10*3/uL (ref 4.0–10.5)
nRBC: 0 % (ref 0.0–0.2)

## 2020-08-23 LAB — BASIC METABOLIC PANEL
Anion gap: 10 (ref 5–15)
BUN: 12 mg/dL (ref 6–20)
CO2: 24 mmol/L (ref 22–32)
Calcium: 9 mg/dL (ref 8.9–10.3)
Chloride: 104 mmol/L (ref 98–111)
Creatinine, Ser: 0.73 mg/dL (ref 0.44–1.00)
GFR, Estimated: >60 mL/min (ref >60)
Glucose, Bld: 90 mg/dL (ref 70–99)
Potassium: 3.9 mmol/L (ref 3.5–5.1)
Sodium: 138 mmol/L (ref 135–145)

## 2020-08-23 LAB — SARS CORONAVIRUS 2 (TAT 6-24 HRS): SARS Coronavirus 2: NEGATIVE

## 2020-08-23 LAB — TYPE AND SCREEN
ABO/RH(D): A POS
Antibody Screen: NEGATIVE

## 2020-08-23 LAB — SURGICAL PCR SCREEN
MRSA, PCR: NEGATIVE
Staphylococcus aureus: NEGATIVE

## 2020-08-23 MED ORDER — BUPIVACAINE LIPOSOME 1.3 % IJ SUSP
20.0000 mL | Freq: Once | INTRAMUSCULAR | Status: AC
  Administered 2020-08-24: 10 mL
  Filled 2020-08-23: qty 20

## 2020-08-23 NOTE — Anesthesia Preprocedure Evaluation (Addendum)
Anesthesia Evaluation  Patient identified by MRN, date of birth, ID band Patient awake    Reviewed: Allergy & Precautions, NPO status , Patient's Chart, lab work & pertinent test results  Airway Mallampati: III  TM Distance: >3 FB Neck ROM: Full  Mouth opening: Limited Mouth Opening  Dental no notable dental hx. (+) Teeth Intact, Dental Advisory Given   Pulmonary neg pulmonary ROS,    Pulmonary exam normal breath sounds clear to auscultation       Cardiovascular negative cardio ROS Normal cardiovascular exam Rhythm:Regular Rate:Normal     Neuro/Psych  Headaches, PSYCHIATRIC DISORDERS Anxiety Depression    GI/Hepatic Neg liver ROS, GERD  Controlled,  Endo/Other  Obesity BMI 35  Renal/GU negative Renal ROS  negative genitourinary   Musculoskeletal Spondylolisthesis L5-S1 Has B/L numbness front of legs from knees down   Abdominal   Peds  Hematology negative hematology ROS (+)   Anesthesia Other Findings   Reproductive/Obstetrics                            Anesthesia Physical Anesthesia Plan  ASA: II  Anesthesia Plan: General   Post-op Pain Management:    Induction: Intravenous  PONV Risk Score and Plan: 3 and Ondansetron, Dexamethasone, Midazolam, Scopolamine patch - Pre-op and Treatment may vary due to age or medical condition  Airway Management Planned: Oral ETT  Additional Equipment: None  Intra-op Plan:   Post-operative Plan: Extubation in OR  Informed Consent: I have reviewed the patients History and Physical, chart, labs and discussed the procedure including the risks, benefits and alternatives for the proposed anesthesia with the patient or authorized representative who has indicated his/her understanding and acceptance.     Dental advisory given  Plan Discussed with: CRNA  Anesthesia Plan Comments:        Anesthesia Quick Evaluation

## 2020-08-23 NOTE — Progress Notes (Signed)
PCP - [name], FNP Cardiologist - denies  Chest x-ray - n/a EKG - n/a Stress Test - denies ECHO - denies Cardiac Cath - denies  Sleep Study - OSA+ CPAP - does not use a CPAP  Blood Thinner Instructions: n/a Aspirin Instructions:n/a  ERAS Protcol -Stop clear liquids by 0430.  PRE-SURGERY Ensure or G2- Ensure given, please complete by 0430 DOS.   COVID TEST- 08/23/20. Pt aware of quarantine guidelines.   Anesthesia review: np  Patient denies shortness of breath, fever, cough and chest pain at PAT appointment   All instructions explained to the patient, with a verbal understanding of the material. Patient agrees to go over the instructions while at home for a better understanding. Patient also instructed to self quarantine after being tested for COVID-19. The opportunity to ask questions was provided.    Coronavirus Screening  Have you experienced the following symptoms:  Cough yes/no: No Fever (>100.23F)  yes/no: No Runny nose yes/no: No Sore throat yes/no: No Difficulty breathing/shortness of breath  yes/no: No  Have you or a family member traveled in the last 14 days and where? yes/no: No   If the patient indicates "YES" to the above questions, their PAT will be rescheduled to limit the exposure to others and, the surgeon will be notified. THE PATIENT WILL NEED TO BE ASYMPTOMATIC FOR 14 DAYS.   If the patient is not experiencing any of these symptoms, the PAT nurse will instruct them to NOT bring anyone with them to their appointment since they may have these symptoms or traveled as well.   Please remind your patients and families that hospital visitation restrictions are in effect and the importance of the restrictions.

## 2020-08-24 ENCOUNTER — Inpatient Hospital Stay (HOSPITAL_COMMUNITY)
Admission: RE | Disposition: A | Discharge status: Home Health | Payer: Self-pay | Source: Home / Self Care | Attending: Specialist

## 2020-08-24 ENCOUNTER — Encounter (HOSPITAL_COMMUNITY): Payer: Self-pay | Admitting: Specialist

## 2020-08-24 ENCOUNTER — Inpatient Hospital Stay (HOSPITAL_COMMUNITY): Payer: Medicaid Other | Admitting: Anesthesiology

## 2020-08-24 ENCOUNTER — Inpatient Hospital Stay (HOSPITAL_COMMUNITY): Payer: Medicaid Other

## 2020-08-24 ENCOUNTER — Observation Stay (HOSPITAL_COMMUNITY)
Admission: RE | Admit: 2020-08-24 | Discharge: 2020-08-25 | DRG: 455 | Disposition: A | Discharge status: Home Health | Payer: Medicaid Other | Attending: Specialist | Admitting: Specialist

## 2020-08-24 DIAGNOSIS — M4317 Spondylolisthesis, lumbosacral region: Secondary | ICD-10-CM | POA: Diagnosis present

## 2020-08-24 DIAGNOSIS — Z419 Encounter for procedure for purposes other than remedying health state, unspecified: Secondary | ICD-10-CM

## 2020-08-24 DIAGNOSIS — F32A Depression, unspecified: Secondary | ICD-10-CM | POA: Diagnosis not present

## 2020-08-24 DIAGNOSIS — F419 Anxiety disorder, unspecified: Secondary | ICD-10-CM | POA: Diagnosis present

## 2020-08-24 DIAGNOSIS — M4326 Fusion of spine, lumbar region: Secondary | ICD-10-CM | POA: Diagnosis not present

## 2020-08-24 DIAGNOSIS — M5442 Lumbago with sciatica, left side: Secondary | ICD-10-CM | POA: Diagnosis present

## 2020-08-24 DIAGNOSIS — Z800 Family history of malignant neoplasm of digestive organs: Secondary | ICD-10-CM | POA: Diagnosis not present

## 2020-08-24 DIAGNOSIS — Z981 Arthrodesis status: Secondary | ICD-10-CM | POA: Diagnosis not present

## 2020-08-24 DIAGNOSIS — Z79899 Other long term (current) drug therapy: Secondary | ICD-10-CM | POA: Diagnosis not present

## 2020-08-24 DIAGNOSIS — M4300 Spondylolysis, site unspecified: Secondary | ICD-10-CM | POA: Diagnosis not present

## 2020-08-24 DIAGNOSIS — K219 Gastro-esophageal reflux disease without esophagitis: Secondary | ICD-10-CM | POA: Diagnosis not present

## 2020-08-24 DIAGNOSIS — M5117 Intervertebral disc disorders with radiculopathy, lumbosacral region: Secondary | ICD-10-CM | POA: Diagnosis not present

## 2020-08-24 DIAGNOSIS — G8929 Other chronic pain: Secondary | ICD-10-CM | POA: Diagnosis present

## 2020-08-24 DIAGNOSIS — M5116 Intervertebral disc disorders with radiculopathy, lumbar region: Secondary | ICD-10-CM | POA: Diagnosis not present

## 2020-08-24 DIAGNOSIS — F418 Other specified anxiety disorders: Secondary | ICD-10-CM | POA: Diagnosis not present

## 2020-08-24 LAB — POCT PREGNANCY, URINE: Preg Test, Ur: NEGATIVE

## 2020-08-24 IMAGING — RF DG LUMBAR SPINE COMPLETE 4+V
1 series · 4 of 4 positions shown · non-contrast
Comparison: Lumbar spine radiographs dated [DATE]

FLUOROSCOPY TIME:  1 minutes 22 seconds

58.67 mGy

CLINICAL DATA: L5-S1 TLIF

EXAM:
LUMBAR SPINE - COMPLETE 4+ VIEW; DG C-ARM 1-60 MIN

[Series 1: run · 4 of 4 slices shown]
[im 1/4]
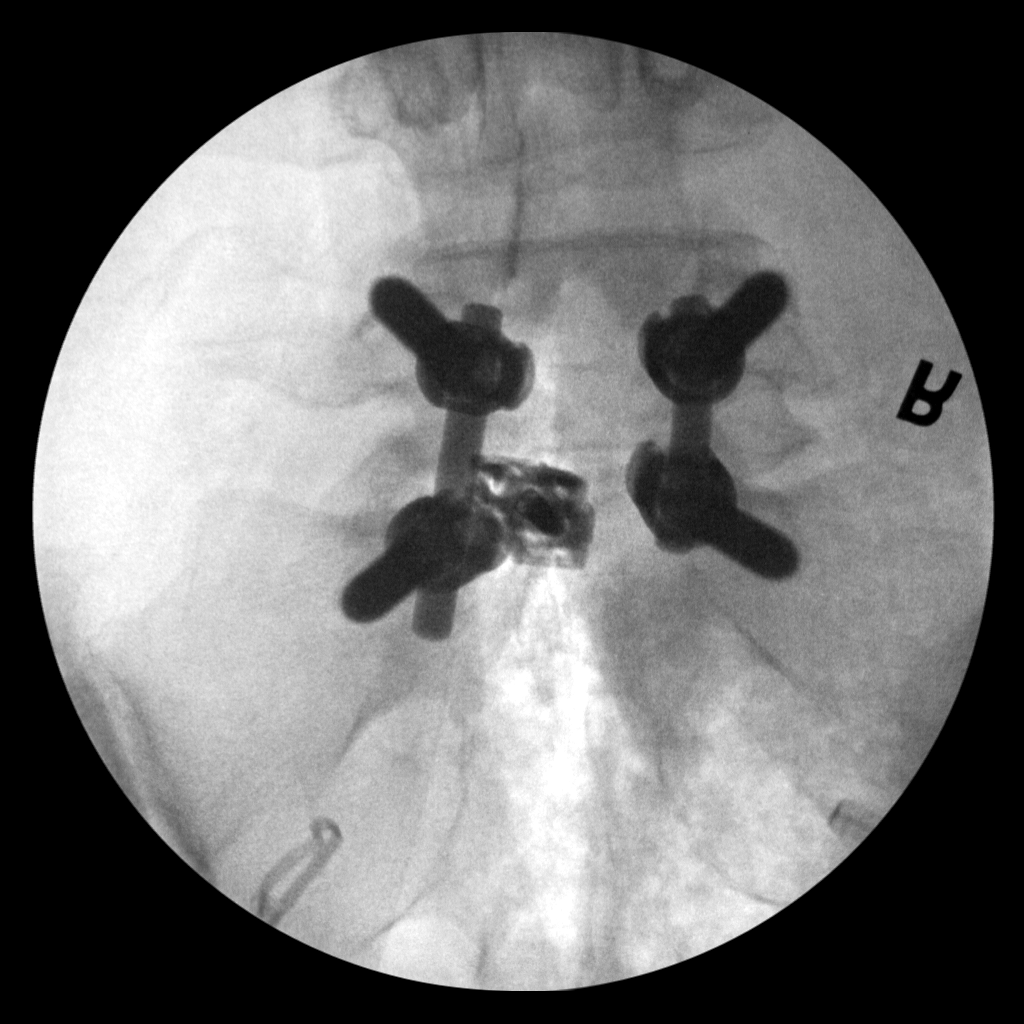
[im 2/4]
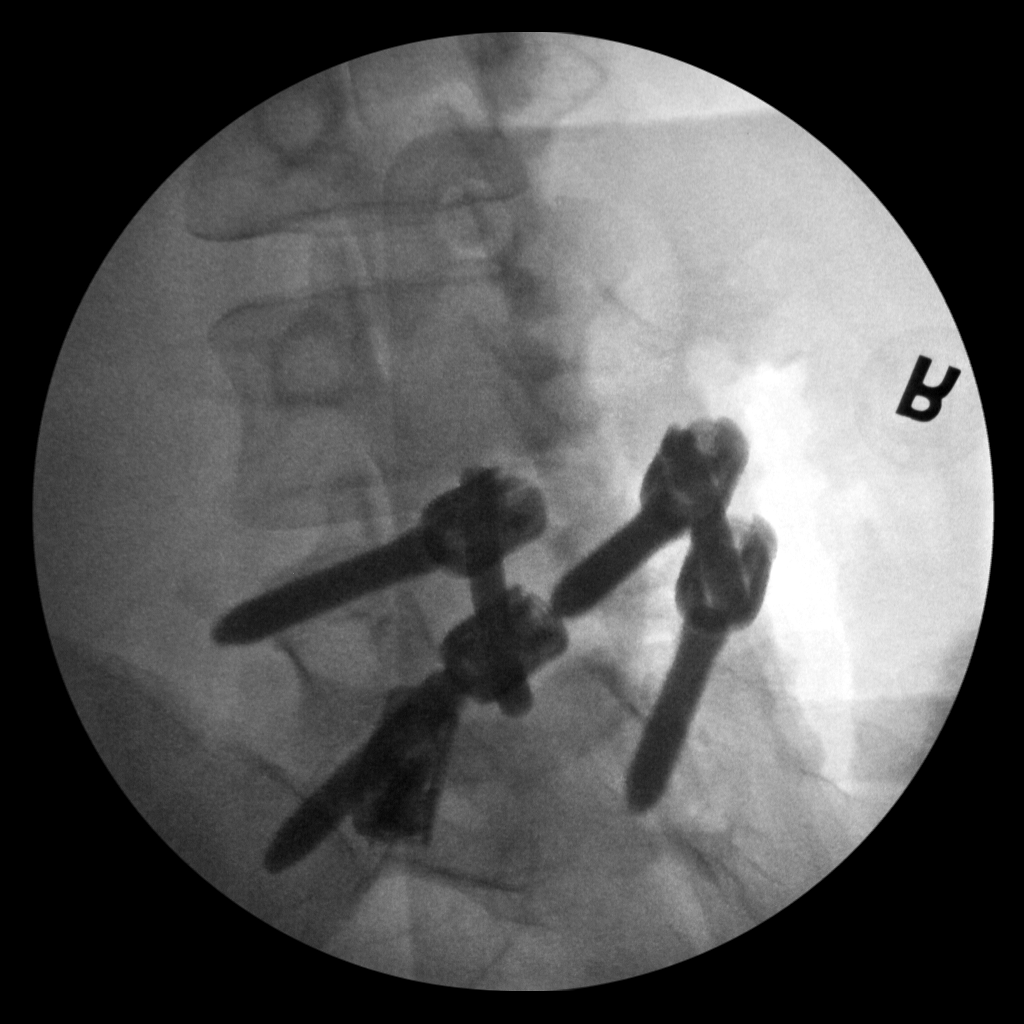
[im 3/4]
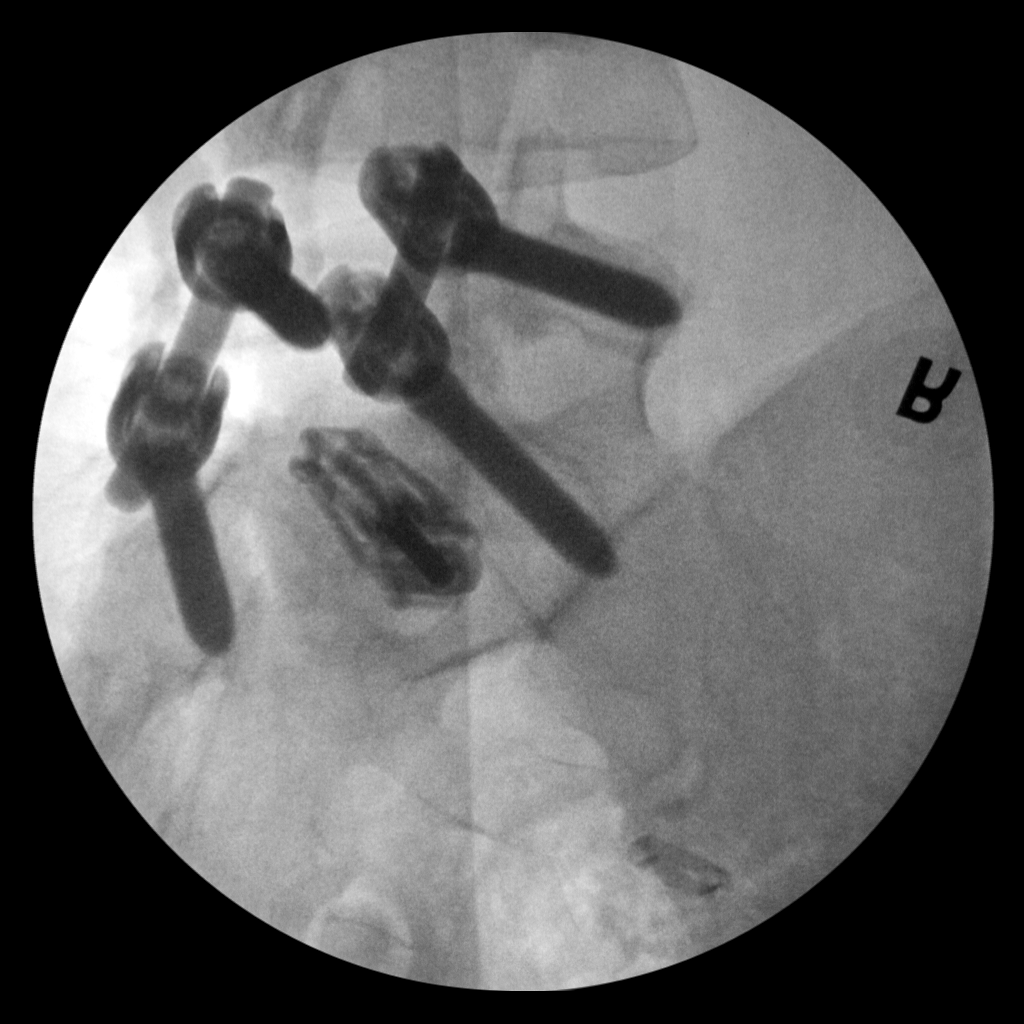
[im 4/4]
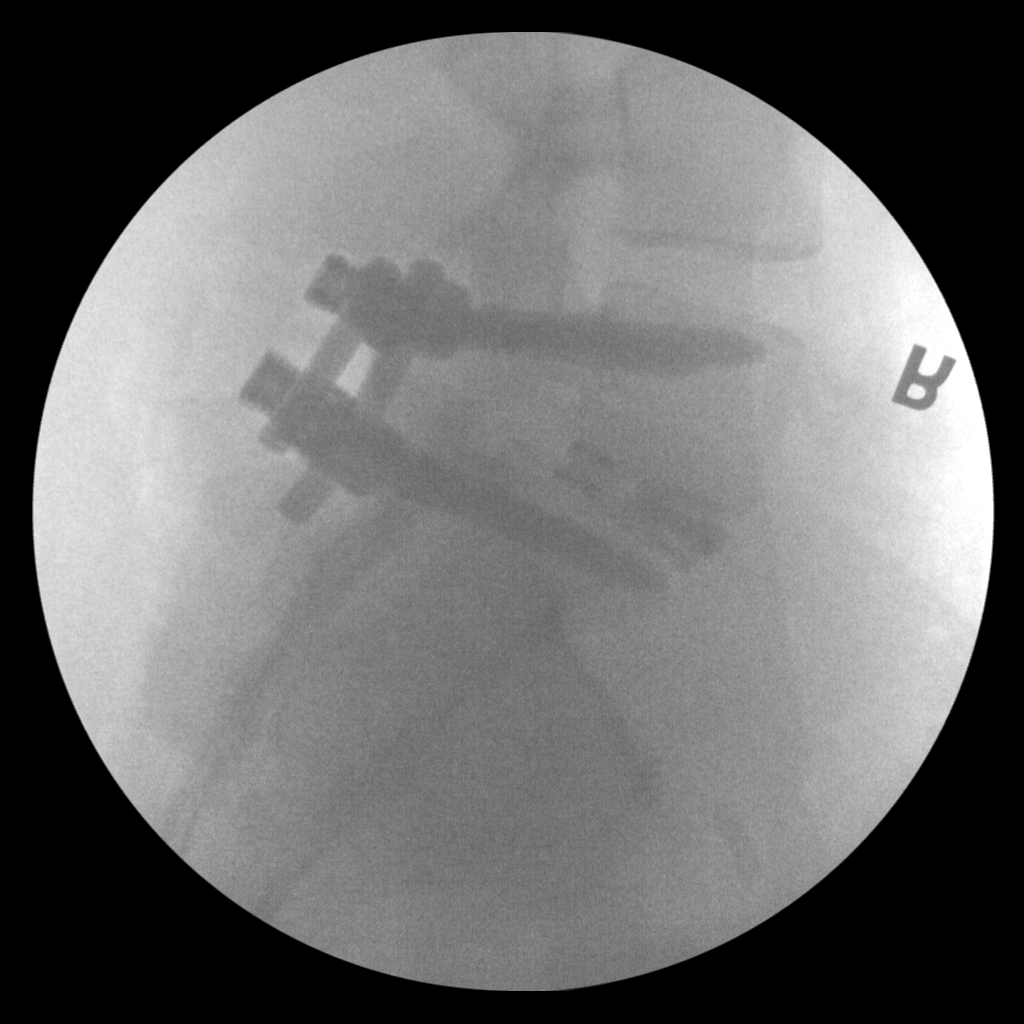

[4 of 4 positions shown; findings below may reference images not displayed]

FINDINGS: Intraoperative fluoroscopic radiographs during L5-S1 posterior
fixation with interbody spacer.
IMPRESSION: Intraoperative fluoroscopic radiographs during L5-S1 posterior
fixation.

## 2020-08-24 IMAGING — RF DG C-ARM 1-60 MIN
1 series · 4 of 4 positions shown · non-contrast
Comparison: Lumbar spine radiographs dated [DATE]

FLUOROSCOPY TIME:  1 minutes 22 seconds

58.67 mGy

CLINICAL DATA: L5-S1 TLIF

EXAM:
LUMBAR SPINE - COMPLETE 4+ VIEW; DG C-ARM 1-60 MIN

[Series 1: run · 4 of 4 slices shown]
[im 1/4]
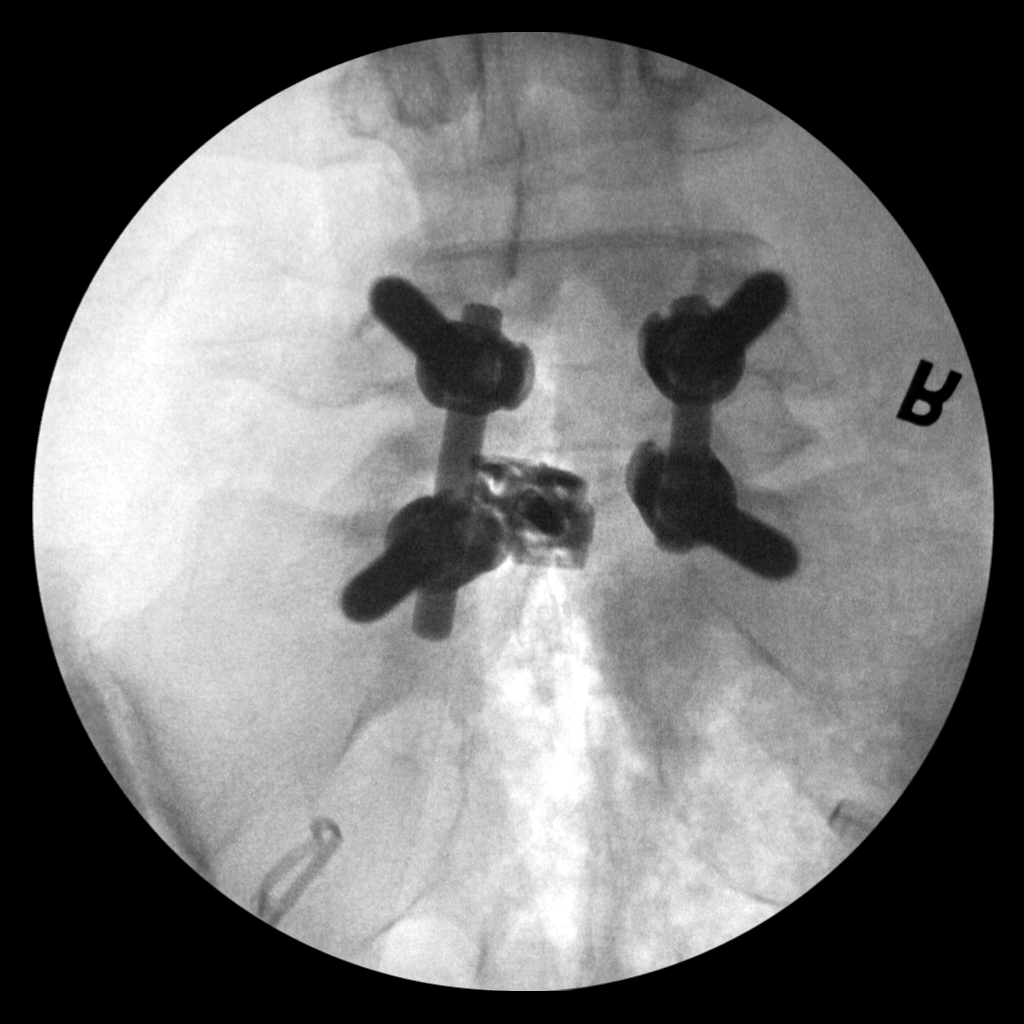
[im 2/4]
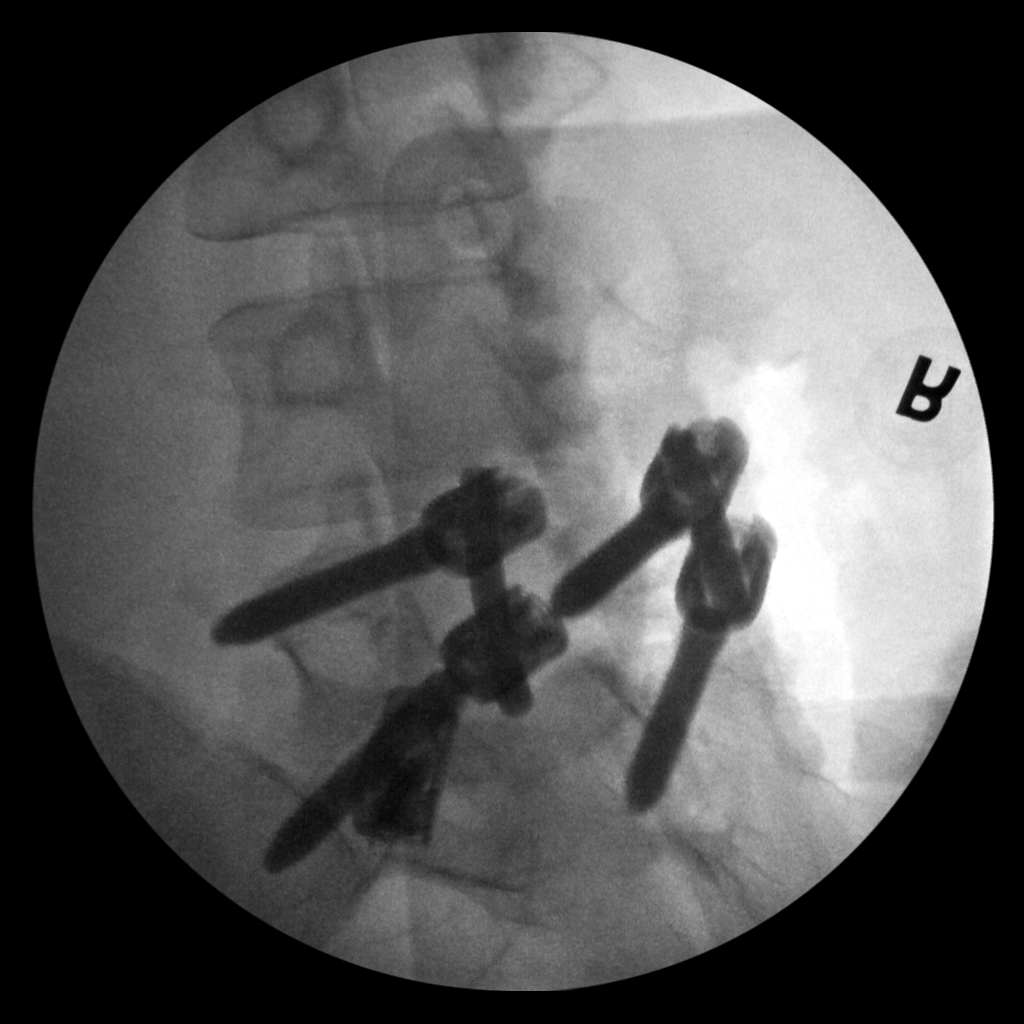
[im 3/4]
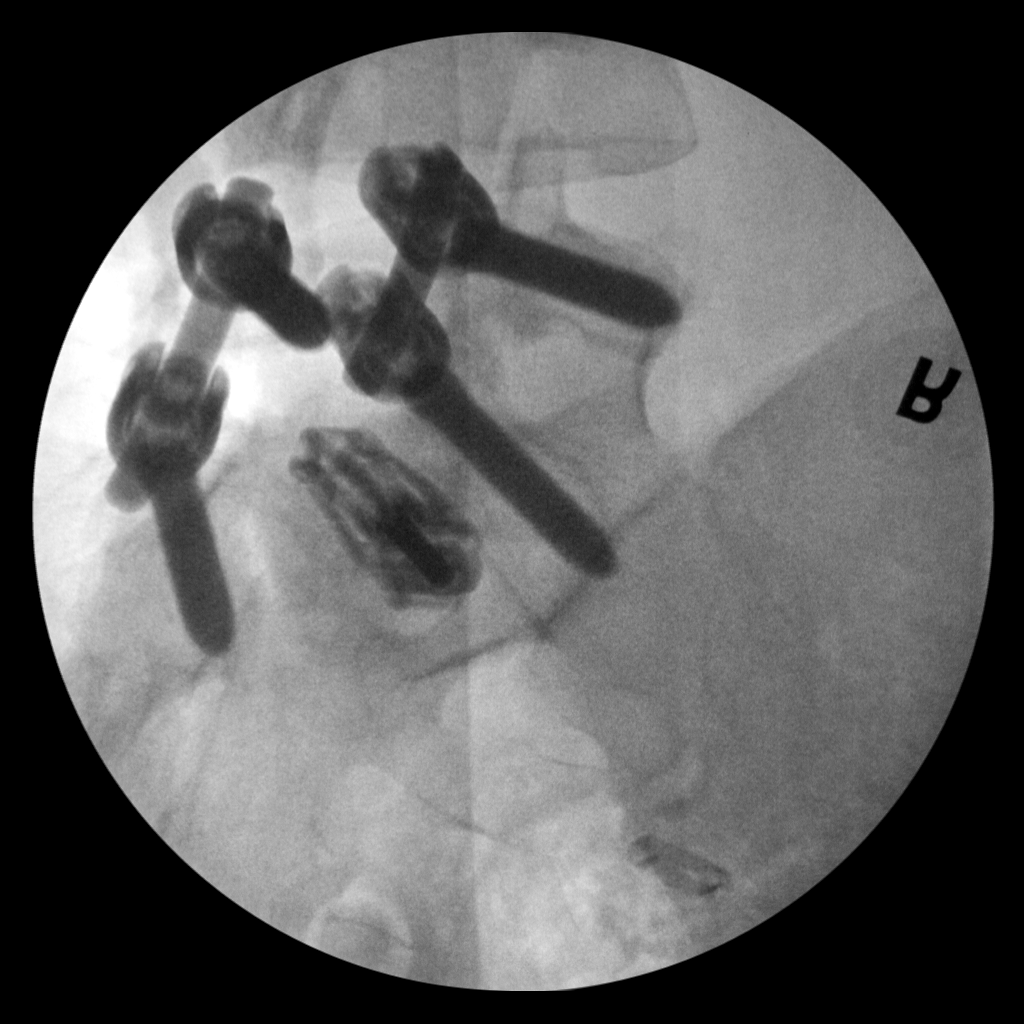
[im 4/4]
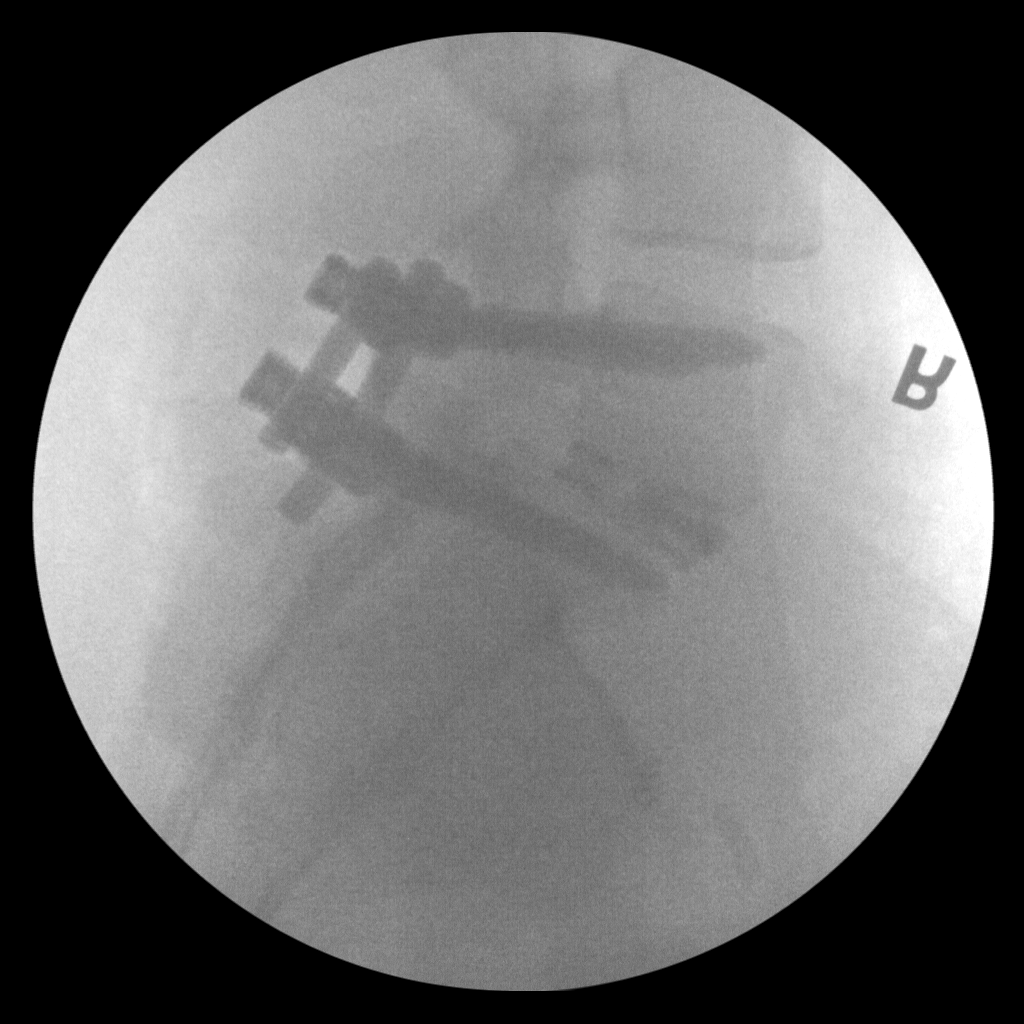

[4 of 4 positions shown; findings below may reference images not displayed]

FINDINGS: Intraoperative fluoroscopic radiographs during L5-S1 posterior
fixation with interbody spacer.
IMPRESSION: Intraoperative fluoroscopic radiographs during L5-S1 posterior
fixation.

## 2020-08-24 SURGERY — POSTERIOR LUMBAR FUSION 1 LEVEL
Anesthesia: General | Site: Spine Lumbar

## 2020-08-24 MED ORDER — HYDROCODONE-ACETAMINOPHEN 7.5-325 MG PO TABS
1.0000 | ORAL_TABLET | Freq: Four times a day (QID) | ORAL | Status: DC
Start: 2020-08-24 — End: 2020-08-25
  Administered 2020-08-24 – 2020-08-25 (×4): 1 via ORAL
  Filled 2020-08-24 (×4): qty 1

## 2020-08-24 MED ORDER — FENTANYL CITRATE (PF) 250 MCG/5ML IJ SOLN
INTRAMUSCULAR | Status: AC
  Filled 2020-08-24: qty 5

## 2020-08-24 MED ORDER — HYDROMORPHONE HCL 1 MG/ML IJ SOLN
0.2500 mg | INTRAMUSCULAR | Status: DC | PRN
  Administered 2020-08-24 (×2): 0.5 mg via INTRAVENOUS

## 2020-08-24 MED ORDER — LIDOCAINE 2% (20 MG/ML) 5 ML SYRINGE
INTRAMUSCULAR | Status: DC | PRN
  Administered 2020-08-24: 60 mg via INTRAVENOUS

## 2020-08-24 MED ORDER — METHOCARBAMOL 500 MG PO TABS
500.0000 mg | ORAL_TABLET | Freq: Four times a day (QID) | ORAL | Status: DC | PRN
  Administered 2020-08-24 – 2020-08-25 (×2): 500 mg via ORAL
  Filled 2020-08-24 (×2): qty 1

## 2020-08-24 MED ORDER — MIDAZOLAM HCL 2 MG/2ML IJ SOLN
INTRAMUSCULAR | Status: AC
  Filled 2020-08-24: qty 2

## 2020-08-24 MED ORDER — ESCITALOPRAM OXALATE 10 MG PO TABS
10.0000 mg | ORAL_TABLET | Freq: Every day | ORAL | Status: DC
  Administered 2020-08-25: 10 mg via ORAL
  Filled 2020-08-24 (×2): qty 1

## 2020-08-24 MED ORDER — SODIUM CHLORIDE 0.9% FLUSH
3.0000 mL | Freq: Two times a day (BID) | INTRAVENOUS | Status: DC
  Administered 2020-08-24: 3 mL via INTRAVENOUS

## 2020-08-24 MED ORDER — PROPOFOL 10 MG/ML IV BOLUS
INTRAVENOUS | Status: AC
  Filled 2020-08-24: qty 20

## 2020-08-24 MED ORDER — ROCURONIUM BROMIDE 10 MG/ML (PF) SYRINGE
PREFILLED_SYRINGE | INTRAVENOUS | Status: AC
  Filled 2020-08-24: qty 10

## 2020-08-24 MED ORDER — PROMETHAZINE HCL 25 MG/ML IJ SOLN: 6.2500 mg | INTRAMUSCULAR | Status: DC | PRN

## 2020-08-24 MED ORDER — HYDROMORPHONE HCL 1 MG/ML IJ SOLN
INTRAMUSCULAR | Status: AC
  Filled 2020-08-24: qty 1

## 2020-08-24 MED ORDER — PHENOL 1.4 % MT LIQD: 1.0000 | OROMUCOSAL | Status: DC | PRN

## 2020-08-24 MED ORDER — LORATADINE 10 MG PO TABS
10.0000 mg | ORAL_TABLET | Freq: Every day | ORAL | Status: DC
  Administered 2020-08-25: 10 mg via ORAL
  Filled 2020-08-24: qty 1

## 2020-08-24 MED ORDER — METHOCARBAMOL 1000 MG/10ML IJ SOLN
500.0000 mg | Freq: Four times a day (QID) | INTRAVENOUS | Status: DC | PRN
  Filled 2020-08-24: qty 5

## 2020-08-24 MED ORDER — THROMBIN 20000 UNITS EX SOLR
CUTANEOUS | Status: DC | PRN
  Administered 2020-08-24: 20 mL via TOPICAL

## 2020-08-24 MED ORDER — DEXAMETHASONE SODIUM PHOSPHATE 10 MG/ML IJ SOLN
INTRAMUSCULAR | Status: DC | PRN
  Administered 2020-08-24: 10 mg via INTRAVENOUS

## 2020-08-24 MED ORDER — ONDANSETRON HCL 4 MG/2ML IJ SOLN
INTRAMUSCULAR | Status: DC | PRN
  Administered 2020-08-24: 4 mg via INTRAVENOUS

## 2020-08-24 MED ORDER — HYDROXYZINE HCL 50 MG/ML IM SOLN
50.0000 mg | Freq: Four times a day (QID) | INTRAMUSCULAR | Status: DC | PRN
  Administered 2020-08-24: 50 mg via INTRAMUSCULAR
  Filled 2020-08-24: qty 1

## 2020-08-24 MED ORDER — SUGAMMADEX SODIUM 200 MG/2ML IV SOLN
INTRAVENOUS | Status: DC | PRN
  Administered 2020-08-24: 200 mg via INTRAVENOUS

## 2020-08-24 MED ORDER — BUPIVACAINE HCL 0.5 % IJ SOLN
INTRAMUSCULAR | Status: DC | PRN
  Administered 2020-08-24: 10 mL

## 2020-08-24 MED ORDER — CEFAZOLIN SODIUM-DEXTROSE 2-4 GM/100ML-% IV SOLN
2.0000 g | INTRAVENOUS | Status: AC
  Administered 2020-08-24 (×2): 2 g via INTRAVENOUS
  Filled 2020-08-24: qty 100

## 2020-08-24 MED ORDER — POLYETHYLENE GLYCOL 3350 17 G PO PACK: 17.0000 g | PACK | Freq: Every day | ORAL | Status: DC | PRN

## 2020-08-24 MED ORDER — TOPIRAMATE 25 MG PO TABS
50.0000 mg | ORAL_TABLET | Freq: Every day | ORAL | Status: DC
  Administered 2020-08-24: 50 mg via ORAL
  Filled 2020-08-24: qty 2

## 2020-08-24 MED ORDER — SODIUM CHLORIDE 0.9 % IV SOLN: INTRAVENOUS | Status: DC | PRN

## 2020-08-24 MED ORDER — FLEET ENEMA 7-19 GM/118ML RE ENEM: 1.0000 | ENEMA | Freq: Once | RECTAL | Status: DC | PRN

## 2020-08-24 MED ORDER — ONDANSETRON HCL 4 MG/2ML IJ SOLN: 4.0000 mg | Freq: Four times a day (QID) | INTRAMUSCULAR | Status: DC | PRN

## 2020-08-24 MED ORDER — THROMBIN (RECOMBINANT) 20000 UNITS EX SOLR
CUTANEOUS | Status: AC
  Filled 2020-08-24: qty 20000

## 2020-08-24 MED ORDER — DOCUSATE SODIUM 100 MG PO CAPS
100.0000 mg | ORAL_CAPSULE | Freq: Two times a day (BID) | ORAL | Status: DC
  Administered 2020-08-24 – 2020-08-25 (×3): 100 mg via ORAL
  Filled 2020-08-24 (×3): qty 1

## 2020-08-24 MED ORDER — FENTANYL CITRATE (PF) 100 MCG/2ML IJ SOLN
INTRAMUSCULAR | Status: DC | PRN
  Administered 2020-08-24: 50 ug via INTRAVENOUS
  Administered 2020-08-24: 100 ug via INTRAVENOUS
  Administered 2020-08-24 (×3): 50 ug via INTRAVENOUS

## 2020-08-24 MED ORDER — PHENYLEPHRINE 40 MCG/ML (10ML) SYRINGE FOR IV PUSH (FOR BLOOD PRESSURE SUPPORT)
PREFILLED_SYRINGE | INTRAVENOUS | Status: AC
  Filled 2020-08-24: qty 10

## 2020-08-24 MED ORDER — ONDANSETRON HCL 4 MG PO TABS: 4.0000 mg | ORAL_TABLET | Freq: Four times a day (QID) | ORAL | Status: DC | PRN

## 2020-08-24 MED ORDER — ACETAMINOPHEN 325 MG PO TABS
650.0000 mg | ORAL_TABLET | ORAL | Status: DC | PRN
  Administered 2020-08-25: 650 mg via ORAL
  Filled 2020-08-24: qty 2

## 2020-08-24 MED ORDER — EPHEDRINE SULFATE-NACL 50-0.9 MG/10ML-% IV SOSY
PREFILLED_SYRINGE | INTRAVENOUS | Status: DC | PRN
  Administered 2020-08-24 (×3): 10 mg via INTRAVENOUS

## 2020-08-24 MED ORDER — ONDANSETRON HCL 4 MG/2ML IJ SOLN
INTRAMUSCULAR | Status: AC
  Filled 2020-08-24: qty 2

## 2020-08-24 MED ORDER — DEXAMETHASONE SODIUM PHOSPHATE 10 MG/ML IJ SOLN
INTRAMUSCULAR | Status: AC
  Filled 2020-08-24: qty 1

## 2020-08-24 MED ORDER — PANTOPRAZOLE SODIUM 40 MG IV SOLR: 40.0000 mg | Freq: Every day | INTRAVENOUS | Status: DC

## 2020-08-24 MED ORDER — OXYCODONE HCL 5 MG PO TABS: 5.0000 mg | ORAL_TABLET | Freq: Once | ORAL | Status: DC | PRN

## 2020-08-24 MED ORDER — SCOPOLAMINE 1 MG/3DAYS TD PT72
1.0000 | MEDICATED_PATCH | TRANSDERMAL | Status: DC
  Administered 2020-08-24: 1.5 mg via TRANSDERMAL
  Filled 2020-08-24: qty 1

## 2020-08-24 MED ORDER — MORPHINE SULFATE (PF) 2 MG/ML IV SOLN
1.0000 mg | INTRAVENOUS | Status: DC | PRN
  Administered 2020-08-24: 1 mg via INTRAVENOUS
  Filled 2020-08-24: qty 1

## 2020-08-24 MED ORDER — ACETAMINOPHEN 500 MG PO TABS
1000.0000 mg | ORAL_TABLET | Freq: Once | ORAL | Status: AC
  Administered 2020-08-24: 1000 mg via ORAL
  Filled 2020-08-24: qty 2

## 2020-08-24 MED ORDER — HYDROCODONE-ACETAMINOPHEN 7.5-325 MG PO TABS
2.0000 | ORAL_TABLET | ORAL | Status: DC | PRN
Start: 2020-08-24 — End: 2020-08-25

## 2020-08-24 MED ORDER — HEMOSTATIC AGENTS (NO CHARGE) OPTIME
TOPICAL | Status: DC | PRN
  Administered 2020-08-24: 1 via TOPICAL

## 2020-08-24 MED ORDER — ALUM & MAG HYDROXIDE-SIMETH 200-200-20 MG/5ML PO SUSP: 30.0000 mL | Freq: Four times a day (QID) | ORAL | Status: DC | PRN

## 2020-08-24 MED ORDER — MENTHOL 3 MG MT LOZG: 1.0000 | LOZENGE | OROMUCOSAL | Status: DC | PRN

## 2020-08-24 MED ORDER — HYDROCODONE-ACETAMINOPHEN 7.5-325 MG PO TABS
1.0000 | ORAL_TABLET | ORAL | Status: DC | PRN
Start: 2020-08-24 — End: 2020-08-25

## 2020-08-24 MED ORDER — PANTOPRAZOLE SODIUM 40 MG PO TBEC
40.0000 mg | DELAYED_RELEASE_TABLET | Freq: Every day | ORAL | Status: DC
  Administered 2020-08-24: 40 mg via ORAL
  Filled 2020-08-24: qty 1

## 2020-08-24 MED ORDER — PROPOFOL 10 MG/ML IV BOLUS
INTRAVENOUS | Status: DC | PRN
  Administered 2020-08-24: 200 mg via INTRAVENOUS

## 2020-08-24 MED ORDER — KETOROLAC TROMETHAMINE 15 MG/ML IJ SOLN
15.0000 mg | Freq: Four times a day (QID) | INTRAMUSCULAR | Status: DC
  Administered 2020-08-24 – 2020-08-25 (×3): 15 mg via INTRAVENOUS
  Filled 2020-08-24 (×3): qty 1

## 2020-08-24 MED ORDER — LACTATED RINGERS IV SOLN: INTRAVENOUS | Status: DC | PRN

## 2020-08-24 MED ORDER — SODIUM CHLORIDE 0.9 % IV SOLN: INTRAVENOUS | Status: DC

## 2020-08-24 MED ORDER — ROCURONIUM BROMIDE 10 MG/ML (PF) SYRINGE
PREFILLED_SYRINGE | INTRAVENOUS | Status: DC | PRN
  Administered 2020-08-24: 30 mg via INTRAVENOUS
  Administered 2020-08-24: 20 mg via INTRAVENOUS
  Administered 2020-08-24: 100 mg via INTRAVENOUS
  Administered 2020-08-24: 30 mg via INTRAVENOUS

## 2020-08-24 MED ORDER — KETOROLAC TROMETHAMINE 30 MG/ML IJ SOLN
INTRAMUSCULAR | Status: AC
  Filled 2020-08-24: qty 1

## 2020-08-24 MED ORDER — ADULT MULTIVITAMIN W/MINERALS CH
1.0000 | ORAL_TABLET | Freq: Every day | ORAL | Status: DC
  Administered 2020-08-24 – 2020-08-25 (×2): 1 via ORAL
  Filled 2020-08-24 (×2): qty 1

## 2020-08-24 MED ORDER — CHLORHEXIDINE GLUCONATE 0.12 % MT SOLN
OROMUCOSAL | Status: AC
  Filled 2020-08-24: qty 15

## 2020-08-24 MED ORDER — KETOROLAC TROMETHAMINE 30 MG/ML IJ SOLN
30.0000 mg | Freq: Once | INTRAMUSCULAR | Status: AC | PRN
  Administered 2020-08-24: 30 mg via INTRAVENOUS

## 2020-08-24 MED ORDER — BUPIVACAINE HCL (PF) 0.5 % IJ SOLN
INTRAMUSCULAR | Status: AC
  Filled 2020-08-24: qty 30

## 2020-08-24 MED ORDER — LIDOCAINE 2% (20 MG/ML) 5 ML SYRINGE
INTRAMUSCULAR | Status: AC
  Filled 2020-08-24: qty 5

## 2020-08-24 MED ORDER — FLUTICASONE PROPIONATE 50 MCG/ACT NA SUSP: 1.0000 | Freq: Every day | NASAL | Status: DC | PRN

## 2020-08-24 MED ORDER — SODIUM CHLORIDE 0.9% FLUSH: 3.0000 mL | INTRAVENOUS | Status: DC | PRN

## 2020-08-24 MED ORDER — ACETAMINOPHEN 650 MG RE SUPP
650.0000 mg | RECTAL | Status: DC | PRN
Start: 2020-08-24 — End: 2020-08-25

## 2020-08-24 MED ORDER — CEFAZOLIN SODIUM-DEXTROSE 2-4 GM/100ML-% IV SOLN
2.0000 g | Freq: Three times a day (TID) | INTRAVENOUS | Status: AC
  Administered 2020-08-24 (×2): 2 g via INTRAVENOUS
  Filled 2020-08-24 (×2): qty 100

## 2020-08-24 MED ORDER — EPHEDRINE 5 MG/ML INJ
INTRAVENOUS | Status: AC
  Filled 2020-08-24: qty 10

## 2020-08-24 MED ORDER — MIDAZOLAM HCL 2 MG/2ML IJ SOLN
INTRAMUSCULAR | Status: DC | PRN
  Administered 2020-08-24: 2 mg via INTRAVENOUS

## 2020-08-24 MED ORDER — PHENYLEPHRINE 40 MCG/ML (10ML) SYRINGE FOR IV PUSH (FOR BLOOD PRESSURE SUPPORT)
PREFILLED_SYRINGE | INTRAVENOUS | Status: DC | PRN
  Administered 2020-08-24 (×2): 80 ug via INTRAVENOUS
  Administered 2020-08-24: 120 ug via INTRAVENOUS
  Administered 2020-08-24: 40 ug via INTRAVENOUS
  Administered 2020-08-24: 80 ug via INTRAVENOUS

## 2020-08-24 MED ORDER — OXYCODONE HCL 5 MG/5ML PO SOLN: 5.0000 mg | Freq: Once | ORAL | Status: DC | PRN

## 2020-08-24 MED ORDER — CEFAZOLIN SODIUM 1 G IJ SOLR
INTRAMUSCULAR | Status: AC
  Filled 2020-08-24: qty 20

## 2020-08-24 MED ORDER — BISACODYL 5 MG PO TBEC: 5.0000 mg | DELAYED_RELEASE_TABLET | Freq: Every day | ORAL | Status: DC | PRN

## 2020-08-24 MED ORDER — MEPERIDINE HCL 25 MG/ML IJ SOLN: 6.2500 mg | INTRAMUSCULAR | Status: DC | PRN

## 2020-08-24 MED ORDER — 0.9 % SODIUM CHLORIDE (POUR BTL) OPTIME
TOPICAL | Status: DC | PRN
  Administered 2020-08-24: 1000 mL

## 2020-08-24 SURGICAL SUPPLY — 73 items
BLADE CLIPPER SURG (BLADE) IMPLANT
BONE VIVIGEN FORMABLE 5.4CC (Bone Implant) ×2 IMPLANT
BUR MATCHSTICK NEURO 3.0 LAGG (BURR) ×2 IMPLANT
BUR SABER RD CUTTING 3.0 (BURR) ×2 IMPLANT
CAP LOCKING THREADED (Cap) ×8 IMPLANT
COVER BACK TABLE 80X110 HD (DRAPES) ×2 IMPLANT
COVER SURGICAL LIGHT HANDLE (MISCELLANEOUS) ×4 IMPLANT
COVER WAND RF STERILE (DRAPES) IMPLANT
DECANTER SPIKE VIAL GLASS SM (MISCELLANEOUS) IMPLANT
DERMABOND ADVANCED (GAUZE/BANDAGES/DRESSINGS) ×1
DERMABOND ADVANCED .7 DNX12 (GAUZE/BANDAGES/DRESSINGS) ×1 IMPLANT
DRAPE C-ARM 42X72 X-RAY (DRAPES) ×2 IMPLANT
DRAPE C-ARMOR (DRAPES) ×2 IMPLANT
DRAPE MICROSCOPE LEICA (MISCELLANEOUS) ×2 IMPLANT
DRAPE POUCH INSTRU U-SHP 10X18 (DRAPES) ×2 IMPLANT
DRAPE SURG 17X23 STRL (DRAPES) ×8 IMPLANT
DRSG MEPILEX BORDER 4X4 (GAUZE/BANDAGES/DRESSINGS) IMPLANT
DRSG MEPILEX BORDER 4X8 (GAUZE/BANDAGES/DRESSINGS) ×2 IMPLANT
DURAPREP 26ML APPLICATOR (WOUND CARE) ×2 IMPLANT
ELECT BLADE 4.0 EZ CLEAN MEGAD (MISCELLANEOUS) ×2
ELECT BLADE 6.5 EXT (BLADE) IMPLANT
ELECT CAUTERY BLADE 6.4 (BLADE) ×2 IMPLANT
ELECT REM PT RETURN 9FT ADLT (ELECTROSURGICAL) ×2
ELECTRODE BLDE 4.0 EZ CLN MEGD (MISCELLANEOUS) ×1 IMPLANT
ELECTRODE REM PT RTRN 9FT ADLT (ELECTROSURGICAL) ×1 IMPLANT
EVACUATOR 1/8 PVC DRAIN (DRAIN) IMPLANT
GAUZE SPONGE 4X4 12PLY STRL (GAUZE/BANDAGES/DRESSINGS) IMPLANT
GLOVE BIOGEL PI IND STRL 8 (GLOVE) ×1 IMPLANT
GLOVE BIOGEL PI INDICATOR 8 (GLOVE) ×1
GLOVE ECLIPSE 9.0 STRL (GLOVE) ×2 IMPLANT
GLOVE ORTHO TXT STRL SZ7.5 (GLOVE) ×2 IMPLANT
GLOVE SURG 8.5 LATEX PF (GLOVE) ×2 IMPLANT
GOWN STRL REUS W/ TWL LRG LVL3 (GOWN DISPOSABLE) ×1 IMPLANT
GOWN STRL REUS W/TWL 2XL LVL3 (GOWN DISPOSABLE) ×4 IMPLANT
GOWN STRL REUS W/TWL LRG LVL3 (GOWN DISPOSABLE) ×1
INTERBODY SABLE 10X26 7-14 15D (Miscellaneous) ×2 IMPLANT
KIT BASIN OR (CUSTOM PROCEDURE TRAY) ×2 IMPLANT
KIT POSITION SURG JACKSON T1 (MISCELLANEOUS) ×2 IMPLANT
KIT TURNOVER KIT B (KITS) ×2 IMPLANT
NEEDLE 22X1 1/2 (OR ONLY) (NEEDLE) ×2 IMPLANT
NEEDLE SPNL 18GX3.5 QUINCKE PK (NEEDLE) ×2 IMPLANT
NS IRRIG 1000ML POUR BTL (IV SOLUTION) ×2 IMPLANT
PACK LAMINECTOMY ORTHO (CUSTOM PROCEDURE TRAY) ×2 IMPLANT
PAD ARMBOARD 7.5X6 YLW CONV (MISCELLANEOUS) ×4 IMPLANT
PATTIES SURGICAL .75X.75 (GAUZE/BANDAGES/DRESSINGS) ×2 IMPLANT
PATTIES SURGICAL 1X1 (DISPOSABLE) IMPLANT
PUTTY DBX 2.5CC (Putty) ×2 IMPLANT
PUTTY DBX 2.5CC DEPUY (Putty) ×1 IMPLANT
ROD CREO 45MM SPINAL (Rod) ×2 IMPLANT
ROD SPINAL 35MM (Rod) ×2 IMPLANT
SCREW CORT MOD CREO 7.5X45 (Screw) ×8 IMPLANT
SPOGE SURGIFLO 8M (HEMOSTASIS)
SPONGE SURGIFLO 8M (HEMOSTASIS) IMPLANT
SPONGE SURGIFOAM ABS GEL 100 (HEMOSTASIS) ×2 IMPLANT
STAPLER VISISTAT 35W (STAPLE) ×2 IMPLANT
SUT VIC AB 0 CT1 27 (SUTURE) ×2
SUT VIC AB 0 CT1 27XBRD ANBCTR (SUTURE) ×2 IMPLANT
SUT VIC AB 1 CT1 27 (SUTURE) ×1
SUT VIC AB 1 CT1 27XBRD ANBCTR (SUTURE) ×1 IMPLANT
SUT VIC AB 1 CTX 36 (SUTURE) ×2
SUT VIC AB 1 CTX36XBRD ANBCTR (SUTURE) ×2 IMPLANT
SUT VIC AB 2-0 CT1 27 (SUTURE) ×1
SUT VIC AB 2-0 CT1 TAPERPNT 27 (SUTURE) ×1 IMPLANT
SUT VIC AB 3-0 X1 27 (SUTURE) ×2 IMPLANT
SUT VICRYL 0 UR6 27IN ABS (SUTURE) ×4 IMPLANT
SYR 20ML LL LF (SYRINGE) ×2 IMPLANT
SYR CONTROL 10ML LL (SYRINGE) ×4 IMPLANT
TOWEL GREEN STERILE (TOWEL DISPOSABLE) ×2 IMPLANT
TOWEL GREEN STERILE FF (TOWEL DISPOSABLE) ×2 IMPLANT
TRAY FOLEY MTR SLVR 16FR STAT (SET/KITS/TRAYS/PACK) ×2 IMPLANT
TULIP CREP AMP 5.5MM (Orthopedic Implant) ×8 IMPLANT
WATER STERILE IRR 1000ML POUR (IV SOLUTION) IMPLANT
YANKAUER SUCT BULB TIP NO VENT (SUCTIONS) ×2 IMPLANT

## 2020-08-24 NOTE — Brief Op Note (Signed)
08/24/2020  12:58 PM  PATIENT:  Kristin Saunders  30 y.o. female  PRE-OPERATIVE DIAGNOSIS:  spondylolisthesis L5-S1 with L5 radiculopathy  POST-OPERATIVE DIAGNOSIS:  spondylolisthesis L5-S1 with L5 radiculopathy  PROCEDURE:  Procedure(s): TRANSFORAMINAL LUMBAR INTERBODY FUSION LUMBAR 5-SACRAL 1 WITH SCREWS, RODS AND CAGES, VIVIGEN, LOCAL BONE GRAFT, ALLOGRAFT BONE GRAFT (N/A)  SURGEON:  Surgeon(s) and Role:    * Kerrin Champagne, MD - Primary  PHYSICIAN ASSISTANT: Zonia Kief, PA-C  ANESTHESIA:   local and general, Dr. Salvadore Farber.  EBL:  450 mL   BLOOD ADMINISTERED:150 CC CELLSAVER  DRAINS: Urinary Catheter (Foley)   LOCAL MEDICATIONS USED:  MARCAINE 0.5% 1:1 EXPAREL 1.3%   Amount: 20 ml  SPECIMEN:  No Specimen  DISPOSITION OF SPECIMEN:  N/A  COUNTS:  YES  TOURNIQUET:  * No tourniquets in log *  DICTATION: .Dragon Dictation and Note written in paper chart  PLAN OF CARE: Admit for overnight observation  PATIENT DISPOSITION:  PACU - hemodynamically stable.   Delay start of Pharmacological VTE agent (>24hrs) due to surgical blood loss or risk of bleeding: yes

## 2020-08-24 NOTE — Op Note (Signed)
08/24/2020  1:13 PM  PATIENT:  Kristin Saunders  30 y.o. female  MRN: [id number]  OPERATIVE REPORT  PRE-OPERATIVE DIAGNOSIS:  spondylolisthesis L5-S1 with L5 radiculopathy  POST-OPERATIVE DIAGNOSIS:  spondylolisthesis L5-S1 with L5 radiculopathy  PROCEDURE:  Procedure(s): TRANSFORAMINAL LUMBAR INTERBODY FUSION LUMBAR 5-SACRAL 1 WITH SCREWS, RODS AND CAGES, VIVIGEN, LOCAL BONE GRAFT, ALLOGRAFT BONE GRAFT    SURGEON:  Jessy Oto, MD     ASSISTANT: Benjiman Core, PA-C  (Present throughout the entire procedure and necessary for completion of procedure in a timely manner)     ANESTHESIA:  General, supplemented with local anesthesia 20cc marcaine 0.5% 1:1 exparel, Dr. Doroteo Glassman.     COMPLICATIONS:  None.   EBL: 450CC  CELL SAVER BLOOD RETURNED: 150CC  DRAINS: Foley to SD.     COMPONENTS:   Implant Name Type Inv. Item Serial No. Manufacturer Lot No. LRB No. Used Action  BONE VIVIGEN FORMABLE 5.4CC - [id number] Bone Implant BONE VIVIGEN FORMABLE 5.4CC [id number] [facility]  N/A 1 Implanted  TULIP CREP AMP 5.5MM - [id number] Orthopedic Implant TULIP CREP AMP 5.5MM  [employer]  N/A 4 Implanted  SCREW CORT MOD CREO 7.5X45 - [id number] Screw SCREW CORT MOD CREO 7.5X45  [employer]  N/A 4 Implanted  CAP LOCKING THREADED - [id number] Cap CAP LOCKING THREADED  [employer]  N/A 4 Implanted  INTERBODY SABLE 10X26 7-14 15D - [id number] Miscellaneous INTERBODY SABLE 10X26 7-14 15D  [employer] [id number] N/A 1 Implanted  PUTTY DBX 2.5CC - [id number] Putty PUTTY DBX 2.5CC [id number] [employer] FNDN  N/A 1 Implanted  ROD SPINAL 35MM - [id number] Rod ROD SPINAL 35MM  [employer]  N/A 1 Implanted  ROD CREO 45MM SPINAL - [id number] Rod ROD CREO 45MM SPINAL  [employer]  N/A 1 Implanted    PROCEDURE:  The patient was met in the holding area, and the appropriate left L5-S1 level was identified and marked with an x and my initials.The  patient was then transported to OR. The patient was then placed under general anesthesia without difficulty.The patient received appropriate preoperative antibiotic prophylaxis ancef. Nursing staff inserted a Foley catheter under sterile conditions. She was then turned to a prone position Alleman spine frame was used for this case. All pressure points were well padded PAS stocking applied bilateral lower extremity to prevent DVT. Standard prep DuraPrep solution from the lower dorsal spine to the mid sacral level. Draped in the usual manner. Time-out procedure was called and correct .  The incision made S1-L4 and carried superiorly an additional one level to the L3 spinous process.  Bovie electric cautery was used to control bleeding and carefully dissection was carried down along the lateral aspects of the spinous process of L3 toS1. Cobb then used to carefully elevate the paralumbar muscle and the incision in the midline was carried to to the level of the base of the residual spinous processes.Intraoperative lateral confirmation of the appropriate level with C-arm sterilely draped. Attention then turned to performance of insertion of pedicle screws the L5-S1 level.  C-arm fluoroscopy was then brought into the field and using C-arm fluoroscopy then a hole made into the medial aspect of the pedicle of L5 using a high speed burr observed in the pedicle using C arm at the 5 oclock position on the left L5 pedicle nerve probe initial entry was determined on fluoroscopy to be good position alignment so that a 4.31m tap was passed to 30 mm within the left L5 pedicle to  a depth of nearly 45 mm observed on C-arm fluoroscopy to be beyond the midpoint of the lumbar vertebra and then position alignment within the left L5 pedicle this was then removed and the pedicle channel probed demonstrating patency no sign of rupture the cortex of the pedicle. Tapping with a 4.5 mm screw tap then 5.74m tap then a 6.5 mm and 7.557mtap a  7.5 mm x 45 mm screw was reserved for later placement on the left side pedicle at the L5 level. C-arm fluoroscopy was then brought into the field and using C-arm fluoroscopy then a hole made into the posterior medial aspect of the pedicle of right L5 observed in the pedicle using ball tipped nerve hook and hockey stick nerve probe initial entry was determined on fluoroscopy to be good position alignment so that 4.38m38map was then used to tap the right L5 pedicle to a depth of nearly 45 mm observed on C-arm fluoroscopy to be beyond the midpoint of the lumbar vertebra and then position alignment within the right L5 pedicle this was then removed and the pedicle channel probed demonstrating patency no sign of rupture the cortex of the pedicle. Tapping with a 5.5 mm screw tap then tapping with a 6.5 mm and 7.5 mm tap, a 7.5 mm x 45 mm screw was placeed.C-arm fluoroscopy was then brought into the field and using C-arm fluoroscopy then a hole made into the posterior and medial aspect of the left pedicle of S1 observed in the pedicle using ball tipped nerve hook and hockey stick nerve probe initial entry was determined on fluoroscopy to be good position alignment so that a 4.5 mm tap was then used to tap the left S1 pedicle to a depth of nearly 45 mm observed on C-arm fluoroscopy to be beyond the posterior one third of the lumbar vertebra and good position alignment within the left S1 pedicle this was then removed and the pedicle channel probed demonstrating patency no sign of rupture the cortex of the pedicle. Tapping with a 4.5 mm screw tap then a 5.5 mm tap then tapping with a 6.5 mm  a 7.0mm67m45 mm screw was saved for later placement on the left side at the S1 level. The pedicle channel of S1 on the left probed demonstrating patency no sign of rupture the cortex of the pedicle.C-arm fluoroscopy was then brought into the field and using C-arm fluoroscopy then a hole made into the posterior and medial aspect of the right  pedicle of S1 observed in the pedicle using ball tipped nerve hook and hockey stick nerve probe initial entry was determined on fluoroscopy to be good position alignment so that a 4.38mm 68m was then used to tap the right S1 pedicle to a depth of nearly 45 mm observed on C-arm fluoroscopy to be beyond the posterior one third of the lumbar vertebra and good position alignment within the right S1 pedicle this was then removed and the pedicle channel probed demonstrating patency no sign of rupture the cortex of the pedicle. Tapping with a 4.38mm s11mw tap then up to a 6.38mm ta63mhen 7.0mm x 456mm screw was placed on the right side at the S1 level. The pedicle channel of S1 on the right probed demonstrating patency no sign of rupture the cortex of the pedicle. Viper screw for fixation of this level was measured as 7.0 mm x 45 mm screw inserted.  Flow Seal was used for hemostasis of the left L5 and S1 pedicle  screw holes. Thrombin-soaked Gelfoam were placed for hemostasis was then carefully removed.  Osteotome was then used to resect the inferior articular process of L5 removing with 3 mm Kerrison and Penfield 4 and pituitary rongeur. The interspinous ligament was then resected betweenL4-5 and L5-S1. Loupe magnification and headlight were used for this portion of the procedure. 3 mm Kerrison used to carefully free of the ligamentum flavum attachment off the superior aspect of the lamina of L5 and inferior aspect of lamina of L5 and the superior aspect of L5 Facet capsules at L5-S1 were resect in the neural arch resected using Leksell rongeur as well as Kerrison removing the larger portions of the spinous process central portions of the lamina small amount of residual or and inferior articular process from a which were carefully resected using Kerrisons. Ligamentum flavum at the L5-S1 level was then resected superiorly off the attachments to the volar aspect of the inferior aspect of the lamina of L5 and the medial aspect  of the L5-S1 facet. Decompression was then carried out over both L5 neuroforamen using a 3 mm Kerrisons left side neuroforamen remained quite narrowed secondary to listhesis of the L5 vertebral body. Medial superior articular process of S1 was then carefully resect using osteotomes and Kerrison so that the L5-S1 disc was exposed bipolar electrocautery used carefully cauterized vascular leash within the axillary area the L5 nerve on the right side. 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 The right L5 nerve root well decompressed. Hockey-stick nerve probe was it will be passed out the right L5 neuroforamen and identifying the superior aspect of the S1 pedicle then osteotome used to resect the superior articular process of S1 transversely at the superior level of the S1 pedicle. This allowed for a wide decompression right L5 neuroforamen.Right L5 nerve root and lateral aspect of the thecal sac at the L5-S1 level was then able to be mobilized and Penfield 4 entered into the disc space and L5-S1 level identified and the nerve structures retracted with Derricho retractors. 15 blade scalpel used to incise the disc the right side. Pituitary rongeur and used to debride degenerative spur on the disc space the right side. A discectomy on the left side was performed. With debridement of degenerative disc material from the left side L5-S1 disc, the disc space was dilated using 7 mm through 9 mm disc space dilators.The disc space was then prepared using straight curette up-biting right and up-biting left and right curettes and ring curettes. Degenerated disc as well as a cartilage endplates debrided from the disc using pituitary rongeurs. The disc space examined demonstrating good bleeding endplate bone surfaces. Bone graft that was harvested from the L4 neural arch was placed into the intervertebral disc space after first sounding the disc space for the  correct cage size. Cage chosen was a 8-55m adjustable sable lordotic cage. Additional bone graft was harvested from the facet areas on both sides. The local bone graft and vivigen was then impacted into the intervertebral disc space at L5-S1 first placing bone graft within the disc space using a forceps then impacted with an 8 mm trial cage. After performing this 3 times then the permanent adjustable lordotic cage packed was then inserted in approximately 15-20 of convergence then filled with bone graft, autogenous local graft and vivigen. Careful inspection of the spinal canal demonstrated no surgery bone graft within the spinal canal both the L5 and L5 nerve roots appeared to exit  without nerve compression. C-arm images were used to confirm the trial size for the permanent cage implant as well as positioning of the implant well within the intervertebral disc this cage was subset deep to the posterior aspect of the disc some 2-3 mm. The left L5 and S1 pedicle screws were then inserted and the fasteners placed on the shank heads of the pedicle screws on the right side. A left precontoured 34m rod then inserted into the left sided fasteners at L5 and S1 and the L5 fastener cap placed and tightened to 80 foot lbs. Compression then placed between the left L5 and S1 fasteners and the left S1 fastener cap then placed and tighted to 80 foot lbs. Similarly the right sided rod was placed using a 40 mm precontoured rod then Tightening and compressing the area between the screws on the right side as was done previously on the left side.  Additional bone graft materior was used to perform right interlaminar and posterior fusion With DBX and vivigen and local bone graft material.  Cell Saver was used during this case with 150 cc of autogenous blood return to the patient. Estimated blood loss was 450 cc. C-arm images were used to confirm the positioning of the implants, pedicle screws position and the cage seated well  within the intervertebral disc this cage was subset deep to the posterior aspect of the disc some 2-3 mm. The midline incision away from neural structures the deep paralumbar muscles were approximated with 1 Vicryl sutures, the lumbodorsal fascia approximated with 1 Vicryl sutures. Subcutaneous layers were then approximated with interrupted 0 and 2-0 Vicryl sutures , skin closed with a stainles steel staples.  Mepilex bandage was then applied.  All instrument and sponge counts were correct. Patient was then returned to the supine position reactivated extubated and returned to the recovery room in satisfactory condition.  JBenjiman Core PA-C for the duties of assistant surgeon throughout this case he assisted loupe magnification retracting delicate neural structures suctioning over the counter neural structures throughout the cane bilaterally at the lower bar segment and superiorly. Is present from the beginning of the case to the end of the case. Assisted in positioning the patient having in positioning the arm and legs. He also participated in removal the patient from the operating table.     JBasil Dess 08/24/2020, 1:13 PM

## 2020-08-24 NOTE — Anesthesia Postprocedure Evaluation (Signed)
Anesthesia Post Note  Patient: Quarry manager  Procedure(s) Performed: TRANSFORAMINAL LUMBAR INTERBODY FUSION LUMBAR 5-SACRAL 1 WITH SCREWS, RODS AND CAGES, VIVIGEN, LOCAL BONE GRAFT, ALLOGRAFT BONE GRAFT (N/A Spine Lumbar)     Patient location during evaluation: PACU Anesthesia Type: General Level of consciousness: awake and alert, oriented and patient cooperative Pain management: pain level controlled Vital Signs Assessment: post-procedure vital signs reviewed and stable Respiratory status: spontaneous breathing, nonlabored ventilation and respiratory function stable Cardiovascular status: blood pressure returned to baseline and stable Postop Assessment: no apparent nausea or vomiting Anesthetic complications: no   No complications documented.  Last Vitals:  Vitals:   08/24/20 1345 08/24/20 1400  BP: (!) 124/111 117/78  Pulse: 98 73  Resp: 17 16  Temp:    SpO2: 99% 98%    Last Pain:  Vitals:   08/24/20 1400  TempSrc:   PainSc: 5                  Lannie Fields

## 2020-08-24 NOTE — Transfer of Care (Signed)
Immediate Anesthesia Transfer of Care Note  Patient: Kristin Saunders  Procedure(s) Performed: TRANSFORAMINAL LUMBAR INTERBODY FUSION LUMBAR 5-SACRAL 1 WITH SCREWS, RODS AND CAGES, VIVIGEN, LOCAL BONE GRAFT, ALLOGRAFT BONE GRAFT (N/A Spine Lumbar)  Patient Location: PACU  Anesthesia Type:General  Level of Consciousness: drowsy and patient cooperative  Airway & Oxygen Therapy: Patient Spontanous Breathing  Post-op Assessment: Report given to RN and Patient moving all extremities X 4  Post vital signs: Reviewed and stable  Last Vitals:  Vitals Value Taken Time  BP 113/62   Temp    Pulse 100 08/24/20 1307  Resp    SpO2 99 % 08/24/20 1307  Vitals shown include unvalidated device data.  Last Pain:  Vitals:   08/24/20 0621  TempSrc: Temporal  PainSc:       Patients Stated Pain Goal: 3 (08/24/20 0610)  Complications: No complications documented.

## 2020-08-24 NOTE — Anesthesia Procedure Notes (Addendum)
Procedure Name: Intubation Date/Time: 08/24/2020 7:55 AM Performed by: Barrington Ellison, CRNA Pre-anesthesia Checklist: Patient identified, Emergency Drugs available, Suction available and Patient being monitored Patient Re-evaluated:Patient Re-evaluated prior to induction Oxygen Delivery Method: Circle System Utilized Preoxygenation: Pre-oxygenation with 100% oxygen Induction Type: IV induction Ventilation: Mask ventilation without difficulty Laryngoscope Size: Mac and 3 Grade View: Grade I Tube type: Oral Tube size: 7.0 mm Number of attempts: 1 Airway Equipment and Method: Stylet and Oral airway Placement Confirmation: ETT inserted through vocal cords under direct vision,  positive ETCO2 and breath sounds checked- equal and bilateral Secured at: 20 cm Tube secured with: Tape Dental Injury: Teeth and Oropharynx as per pre-operative assessment

## 2020-08-24 NOTE — H&P (Signed)
Kristin Saunders is an 30 y.o. female.   Chief Complaint: low back pain and bilateral lower extremity radiculopathy   HPI: 30 year old white female history of L5-S1 spondylolisthesis, HNP/stenosis comes in for preop evaluation. States that symptoms of low back pain and bilateral lower extremity radiculopathy unchanged from previous visit. She is wanting to proceed with L5-S1 transforaminal lumbar interbody fusion as scheduled August 24, 2020. Patient was previously on the schedule for surgery July 20, 2020 but canceled due to complaint of chest pain. Patient was seen in the ED July 20, 2020 and diagnosed with a URI. Started on antibiotic. She was then seen a couple days later at an urgent care and diagnosed with a UTI. Today patient denies any complaints of fever chills pulmonary GI GU issues.  Past Medical History:  Diagnosis Date  . Anxiety   . Depression   . GERD (gastroesophageal reflux disease)   . Headache   . Medical history non-contributory     Past Surgical History:  Procedure Laterality Date  . NO PAST SURGERIES    . TUBAL LIGATION      Family History  Problem Relation Age of Onset  . Cancer Father        COLON   Social History:  reports that she has never smoked. She has never used smokeless tobacco. She reports current alcohol use. She reports that she does not use drugs.  Allergies: No Known Allergies  Medications Prior to Admission  Medication Sig Dispense Refill  . cetirizine (ZYRTEC) 10 MG tablet Take 10 mg by mouth daily.    Marland Kitchen escitalopram (LEXAPRO) 10 MG tablet Take 1 tablet (10 mg total) by mouth daily. 90 tablet 3  . Multiple Vitamin (MULTIVITAMIN WITH MINERALS) TABS tablet Take 1 tablet by mouth daily.    Marland Kitchen topiramate (TOPAMAX) 25 MG tablet Take 50 mg by mouth at bedtime.    . fluticasone (FLONASE) 50 MCG/ACT nasal spray Place 1 spray into both nostrils daily as needed for allergies or rhinitis.    Marland Kitchen traMADol (ULTRAM) 50 MG tablet Take 1 tablet (50 mg  total) by mouth every 6 (six) hours as needed. (Patient not taking: No sig reported) 30 tablet 0    Results for orders placed or performed during the hospital encounter of 08/24/20 (from the past 48 hour(s))  Pregnancy, urine POC     Status: None   Collection Time: 08/24/20  6:17 AM  Result Value Ref Range   Preg Test, Ur NEGATIVE NEGATIVE    Comment:        THE SENSITIVITY OF THIS METHODOLOGY IS >24 mIU/mL    No results found.  Review of Systems  Constitutional: Positive for activity change.  HENT: Negative.   Respiratory: Negative.   Cardiovascular: Negative.   Gastrointestinal: Negative.   Genitourinary: Negative.   Musculoskeletal: Positive for back pain.  Neurological: Positive for numbness.  Psychiatric/Behavioral: Negative.     Blood pressure 110/68, pulse 65, temperature 98.5 F (36.9 C), temperature source Temporal, resp. rate 18, height 5\' 6"  (1.676 m), weight 98.3 kg, last menstrual period 08/09/2020, SpO2 99 %. Physical Exam HENT:     Head: Normocephalic and atraumatic.     Nose: Nose normal.  Eyes:     Extraocular Movements: Extraocular movements intact.     Pupils: Pupils are equal, round, and reactive to light.  Cardiovascular:     Rate and Rhythm: Regular rhythm.  Pulmonary:     Effort: Pulmonary effort is normal. No respiratory distress.  Breath sounds: Normal breath sounds.  Abdominal:     General: There is no distension.  Musculoskeletal:        General: Tenderness present.  Neurological:     Mental Status: She is alert and oriented to person, place, and time.      Assessment/Plan L5-S1 HNP,stenosis  We will proceed with  L5-S1 transforaminal lumbar interbody fusion  as scheduled.  All questions answered.    Zonia Kief, PA-C 08/24/2020, 6:38 AM

## 2020-08-24 NOTE — Discharge Instructions (Addendum)

## 2020-08-24 NOTE — Interval H&P Note (Signed)
History and Physical Interval Note:  08/24/2020 7:39 AM  Kristin Saunders  has presented today for surgery, with the diagnosis of spondylolisthesis L5-S1 with L5 radiculopathy.  The various methods of treatment have been discussed with the patient and family. After consideration of risks, benefits and other options for treatment, the patient has consented to  Procedure(s): TRANSFORAMINAL LUMBAR INTERBODY FUSION L5-S1 WITH SCREWS, RODS AND CAGES, VIVIGEN, LOCAL BONE GRAFT, ALLOGRAFT BONE GRAFT (N/A) as a surgical intervention.  The patient's history has been reviewed, patient examined, no change in status, stable for surgery.  I have reviewed the patient's chart and labs.  Questions were answered to the patient's satisfaction.     Vira Browns

## 2020-08-25 ENCOUNTER — Encounter: Payer: Self-pay | Admitting: Radiology

## 2020-08-25 DIAGNOSIS — M4317 Spondylolisthesis, lumbosacral region: Secondary | ICD-10-CM | POA: Diagnosis present

## 2020-08-25 DIAGNOSIS — F32A Depression, unspecified: Secondary | ICD-10-CM | POA: Diagnosis present

## 2020-08-25 DIAGNOSIS — G8929 Other chronic pain: Secondary | ICD-10-CM | POA: Diagnosis present

## 2020-08-25 DIAGNOSIS — F419 Anxiety disorder, unspecified: Secondary | ICD-10-CM | POA: Diagnosis present

## 2020-08-25 DIAGNOSIS — Z79899 Other long term (current) drug therapy: Secondary | ICD-10-CM | POA: Diagnosis not present

## 2020-08-25 DIAGNOSIS — K219 Gastro-esophageal reflux disease without esophagitis: Secondary | ICD-10-CM | POA: Diagnosis present

## 2020-08-25 DIAGNOSIS — Z800 Family history of malignant neoplasm of digestive organs: Secondary | ICD-10-CM | POA: Diagnosis not present

## 2020-08-25 DIAGNOSIS — M5117 Intervertebral disc disorders with radiculopathy, lumbosacral region: Secondary | ICD-10-CM | POA: Diagnosis not present

## 2020-08-25 LAB — BASIC METABOLIC PANEL
Anion gap: 7 (ref 5–15)
BUN: 10 mg/dL (ref 6–20)
CO2: 25 mmol/L (ref 22–32)
Calcium: 8.5 mg/dL — ABNORMAL LOW (ref 8.9–10.3)
Chloride: 107 mmol/L (ref 98–111)
Creatinine, Ser: 0.85 mg/dL (ref 0.44–1.00)
GFR, Estimated: >60 mL/min (ref >60)
Glucose, Bld: 104 mg/dL — ABNORMAL HIGH (ref 70–99)
Potassium: 3.9 mmol/L (ref 3.5–5.1)
Sodium: 139 mmol/L (ref 135–145)

## 2020-08-25 LAB — CBC
HCT: 35.8 % — ABNORMAL LOW (ref 36.0–46.0)
Hemoglobin: 11.6 g/dL — ABNORMAL LOW (ref 12.0–15.0)
MCH: 31.4 pg (ref 26.0–34.0)
MCHC: 32.4 g/dL (ref 30.0–36.0)
MCV: 97 fL (ref 80.0–100.0)
Platelets: 229 10*3/uL (ref 150–400)
RBC: 3.69 MIL/uL — ABNORMAL LOW (ref 3.87–5.11)
RDW: 12.9 % (ref 11.5–15.5)
WBC: 14.6 10*3/uL — ABNORMAL HIGH (ref 4.0–10.5)
nRBC: 0 % (ref 0.0–0.2)

## 2020-08-25 LAB — ABO/RH: ABO/RH(D): A POS

## 2020-08-25 MED ORDER — HYDROCODONE-ACETAMINOPHEN 7.5-325 MG PO TABS
1.0000 | ORAL_TABLET | ORAL | 0 refills | Status: AC | PRN
Start: 2020-08-25 — End: 2020-09-01

## 2020-08-25 MED ORDER — METHOCARBAMOL 500 MG PO TABS: 500.0000 mg | ORAL_TABLET | Freq: Four times a day (QID) | ORAL | 1 refills | Status: DC | PRN

## 2020-08-25 MED ORDER — DOCUSATE SODIUM 100 MG PO CAPS: 100.0000 mg | ORAL_CAPSULE | Freq: Two times a day (BID) | ORAL | 0 refills | Status: DC

## 2020-08-25 MED FILL — Sodium Chloride Irrigation Soln 0.9%: Qty: 3000 | Status: AC

## 2020-08-25 MED FILL — Heparin Sodium (Porcine) Inj 1000 Unit/ML: INTRAMUSCULAR | Qty: 30 | Status: AC

## 2020-08-25 MED FILL — Sodium Chloride IV Soln 0.9%: INTRAVENOUS | Qty: 1000 | Status: AC

## 2020-08-25 MED FILL — Thrombin (Recombinant) For Soln 20000 Unit: CUTANEOUS | Qty: 1 | Status: AC

## 2020-08-25 NOTE — Progress Notes (Signed)
Patient discharge teaching given, including activity, diet, follow-up appoints, and medications. Patient verbalized understanding of all discharge instructions. IV access was d/c'd. Vitals are stable. Skin is intact except as charted in most recent assessments. Pt to be escorted out by NT, to be driven home by family. 

## 2020-08-25 NOTE — Progress Notes (Signed)
     Subjective: 1 Day Post-Op Procedure(s) (LRB): TRANSFORAMINAL LUMBAR INTERBODY FUSION LUMBAR 5-SACRAL 1 WITH SCREWS, RODS AND CAGES, VIVIGEN, LOCAL BONE GRAFT, ALLOGRAFT BONE GRAFT (N/A) Awake, alert and oriented x 4. Voiding post discontinuing foley. Walking in the hallway this AM.  Patient reports pain as moderate.    Objective:   VITALS:  Temp:  [97.6 F (36.4 C)-98.7 F (37.1 C)] 98.3 F (36.8 C) (01/05 0752) Pulse Rate:  [57-103] 57 (01/05 0752) Resp:  [14-20] 16 (01/05 0752) BP: (104-128)/(50-111) 108/50 (01/05 0752) SpO2:  [98 %-99 %] 99 % (01/05 0752)  Neurologically intact ABD soft Neurovascular intact Sensation intact distally Intact pulses distally Dorsiflexion/Plantar flexion intact Incision: dressing C/D/I and scant drainage No cellulitis present Compartment soft   LABS Recent Labs    08/23/20 1338 08/25/20 0652  HGB 14.6 11.6*  WBC 5.6 14.6*  PLT 265 229   Recent Labs    08/23/20 1338 08/25/20 0652  NA 138 139  K 3.9 3.9  CL 104 107  CO2 24 25  BUN 12 10  CREATININE 0.73 0.85  GLUCOSE 90 104*   No results for input(s): LABPT, INR in the last 72 hours.   Assessment/Plan: 1 Day Post-Op Procedure(s) (LRB): TRANSFORAMINAL LUMBAR INTERBODY FUSION LUMBAR 5-SACRAL 1 WITH SCREWS, RODS AND CAGES, VIVIGEN, LOCAL BONE GRAFT, ALLOGRAFT BONE GRAFT (N/A)  Advance diet Up with therapy Discharge home with home health  Vira Browns 08/25/2020, 8:49 AMPatient ID: Kristin Saunders, female   DOB: 1991-07-18, 30 y.o.   MRN: [id number]

## 2020-08-25 NOTE — Progress Notes (Signed)
Orthopedic Tech Progress Note Patient Details:  Kristin Saunders [birth year] [id number] Called in order to [facility] for a LSO BRACE, noone bothered to call for brace yesterday. Patient ID: Kristin Saunders, female   DOB: 1991/08/10, 30 y.o.   MRN: [id number]   Kristin Saunders 08/25/2020, 8:52 AM

## 2020-08-25 NOTE — Evaluation (Signed)
Occupational Therapy Evaluation Patient Details Name: Kristin Saunders MRN: [id number] DOB: Jul 28, 1991 Today's Date: 08/25/2020    History of Present Illness 30 year old white female history of L5-S1 spondylolisthesis, HNP/stenosis.  Underwent TRANSFORAMINAL LUMBAR INTERBODY FUSION L5-S1 WITH SCREWS, RODS AND CAGES, VIVIGEN, LOCAL BONE GRAFT.   Clinical Impression   Patient admitted with the above diagnosis and procedure.  PTA she used a can on occasion for mobility, and was able to bathe and dress herself from modified sit/stand positions.  She is functioning at baseline with good follow through on all precautions.  Patient is looking to discharge today; she will be staying with her mother for a short while, and will have assist as needed.  No further acute needs.      Follow Up Recommendations  No OT follow up    Equipment Recommendations  None recommended by OT    Recommendations for Other Services       Precautions / Restrictions Precautions Precautions: Back Precaution Booklet Issued: Yes (comment) Required Braces or Orthoses: Spinal Brace Spinal Brace: Lumbar corset Restrictions Weight Bearing Restrictions: No Other Position/Activity Restrictions: Brace not delivered      Mobility Bed Mobility Overal bed mobility: Independent                  Transfers Overall transfer level: Independent                    Balance Overall balance assessment: Mild deficits observed, not formally tested                                         ADL either performed or assessed with clinical judgement   ADL Overall ADL's : At baseline                                       General ADL Comments: Able to complete in room mobility/toilet, and ADL without assist.  Min VC's for back precautions.     Vision Patient Visual Report: No change from baseline       Perception     Praxis      Pertinent Vitals/Pain Pain Assessment:  0-10 Pain Score: 2  Pain Location: low back Pain Descriptors / Indicators: Aching Pain Intervention(s): Monitored during session     Hand Dominance Right   Extremity/Trunk Assessment             Communication Communication Communication: No difficulties   Cognition Arousal/Alertness: Awake/alert Behavior During Therapy: WFL for tasks assessed/performed Overall Cognitive Status: Within Functional Limits for tasks assessed                                     General Comments       Exercises     Shoulder Instructions      Home Living Family/patient expects to be discharged to:: Private residence Living Arrangements: Parent Available Help at Discharge: Family;Available 24 hours/day Type of Home: Apartment Home Access: Stairs to enter Entrance Stairs-Number of Steps: 5 Entrance Stairs-Rails: Can reach both Home Layout: One level     Bathroom Shower/Tub: Chief Strategy Officer: Standard     Home Equipment: Cane - single point  Prior Functioning/Environment Level of Independence: Independent with assistive device(s)        Comments: Mobility with occqasional use of SPC.  Able to perform ADL/IADL independently.        OT Problem List: Pain      OT Treatment/Interventions:      OT Goals(Current goals can be found in the care plan section) Acute Rehab OT Goals Patient Stated Goal: returning home OT Goal Formulation: With patient Time For Goal Achievement: 08/25/20 Potential to Achieve Goals: Good  OT Frequency:     Barriers to D/C:  None          Co-evaluation              AM-PAC OT "6 Clicks" Daily Activity     Outcome Measure Help from another person eating meals?: None Help from another person taking care of personal grooming?: None Help from another person toileting, which includes using toliet, bedpan, or urinal?: None Help from another person bathing (including washing, rinsing, drying)?:  None Help from another person to put on and taking off regular upper body clothing?: None Help from another person to put on and taking off regular lower body clothing?: None 6 Click Score: 24   End of Session Nurse Communication: Mobility status  Activity Tolerance: Patient tolerated treatment well Patient left: in bed  OT Visit Diagnosis: Pain                Time: 0973-5329 OT Time Calculation (min): 32 min Charges:  OT General Charges $OT Visit: 1 Visit OT Evaluation $OT Eval Moderate Complexity: 1 Mod OT Treatments $Self Care/Home Management : 8-22 mins  08/25/2020  Rich, OTR/L  Acute Rehabilitation Services  Office:  [phone]   Suzanna Obey 08/25/2020, 8:50 AM

## 2020-08-25 NOTE — Plan of Care (Signed)
  Problem: Education: Goal: Ability to verbalize activity precautions or restrictions will improve Outcome: Adequate for Discharge Goal: Knowledge of the prescribed therapeutic regimen will improve Outcome: Adequate for Discharge Goal: Understanding of discharge needs will improve Outcome: Adequate for Discharge   Problem: Activity: Goal: Ability to avoid complications of mobility impairment will improve Outcome: Adequate for Discharge Goal: Ability to tolerate increased activity will improve Outcome: Adequate for Discharge Goal: Will remain free from falls Outcome: Adequate for Discharge   Problem: Bowel/Gastric: Goal: Gastrointestinal status for postoperative course will improve Outcome: Adequate for Discharge   Problem: Clinical Measurements: Goal: Ability to maintain clinical measurements within normal limits will improve Outcome: Adequate for Discharge Goal: Postoperative complications will be avoided or minimized Outcome: Adequate for Discharge Goal: Diagnostic test results will improve Outcome: Adequate for Discharge   Problem: Pain Management: Goal: Pain level will decrease Outcome: Adequate for Discharge   Problem: Skin Integrity: Goal: Will show signs of wound healing Outcome: Adequate for Discharge   Problem: Health Behavior/Discharge Planning: Goal: Identification of resources available to assist in meeting health care needs will improve Outcome: Adequate for Discharge   Problem: Bladder/Genitourinary: Goal: Urinary functional status for postoperative course will improve Outcome: Adequate for Discharge   Problem: Safety: Goal: Ability to remain free from injury will improve Outcome: Adequate for Discharge   

## 2020-08-25 NOTE — Evaluation (Signed)
Physical Therapy Evaluation and Discharge Patient Details Name: Kristin Saunders MRN: [id number] DOB: March 18, 1991 Today's Date: 08/25/2020   History of Present Illness  30 year old white female history of L5-S1 spondylolisthesis, HNP/stenosis.  Underwent TRANSFORAMINAL LUMBAR INTERBODY FUSION L5-S1 WITH SCREWS, RODS AND CAGES, VIVIGEN, LOCAL BONE GRAFT.  Clinical Impression   Patient evaluated by Physical Therapy s/p lumbar surgery. All education has been completed and the patient has no further questions. Patient with difficulty remembering and adhering to back precautions. She has history of imbalance and intermittent use of cane. Recommending HHPT to continue education re: back precautions and use of RW. MD in during session and planning to discharge pt home today (to stay with her mother).  PT is signing off. Thank you for this referral.     Follow Up Recommendations Home health PT;Supervision for mobility/OOB    Equipment Recommendations  Rolling Slane with 5" wheels    Recommendations for Other Services       Precautions / Restrictions Precautions Precautions: Back Precaution Booklet Issued: Yes (comment) Required Braces or Orthoses: Spinal Brace Spinal Brace: Lumbar corset (not yet delivered; educated on use) Restrictions Weight Bearing Restrictions: No Other Position/Activity Restrictions: Brace not delivered      Mobility  Bed Mobility Overal bed mobility: Needs Assistance Bed Mobility: Sidelying to Sit;Sit to Sidelying   Sidelying to sit: Modified independent (Device/Increase time)     Sit to sidelying: Min guard General bed mobility comments: pt initially twisting as going into sidelying and required cues to prevent    Transfers Overall transfer level: Needs assistance Equipment used: Rolling Sermon (2 wheeled);None Transfers: Sit to/from Stand Sit to Stand: Supervision         General transfer comment: no device pt independent; with RW required cues for  safe use and to not twist to look at chair prior to sitting  Ambulation/Gait Ambulation/Gait assistance: Min guard;Supervision Gait Distance (Feet): 120 Feet (100) Assistive device: None;Rolling Dobbin (2 wheeled) Gait Pattern/deviations: Step-through pattern;Decreased stride length;Wide base of support (guarded)     General Gait Details: pt reports h/o using cane at times PTA due to imbalance (she cannot describe cause of imbalance-denies dizziness); pt's gait mechanics and safety improved with use of RW  Stairs Stairs: Yes Stairs assistance: Min guard Stair Management: One rail Left;Alternating pattern;Forwards Number of Stairs: 8 General stair comments: pt with no imbalance with use of rail  Wheelchair Mobility    Modified Rankin (Stroke Patients Only)       Balance Overall balance assessment: Mild deficits observed, not formally tested                                           Pertinent Vitals/Pain Pain Assessment: 0-10 Pain Score: 6  Pain Location: low back Pain Descriptors / Indicators: Aching Pain Intervention(s): Limited activity within patient's tolerance;Monitored during session;Premedicated before session    Home Living Family/patient expects to be discharged to:: Private residence Living Arrangements: Parent Available Help at Discharge: Family;Available 24 hours/day Type of Home: Apartment Home Access: Stairs to enter Entrance Stairs-Rails: Can reach both Entrance Stairs-Number of Steps: 5 Home Layout: One level Home Equipment: Cane - single point      Prior Function Level of Independence: Independent with assistive device(s)         Comments: Mobility with occqasional use of SPC due to imbalance.  Able to perform ADL/IADL independently.  Hand Dominance   Dominant Hand: Right    Extremity/Trunk Assessment   Upper Extremity Assessment Upper Extremity Assessment: Defer to OT evaluation    Lower Extremity  Assessment Lower Extremity Assessment: Generalized weakness (reports pain in legs is "much better" than PTA)    Cervical / Trunk Assessment Cervical / Trunk Assessment: Other exceptions Cervical / Trunk Exceptions: overweight  Communication   Communication: No difficulties  Cognition Arousal/Alertness: Awake/alert Behavior During Therapy: WFL for tasks assessed/performed Overall Cognitive Status: Impaired/Different from baseline Area of Impairment: Memory;Problem solving                     Memory: Decreased recall of precautions;Decreased short-term memory       Problem Solving: Difficulty sequencing;Requires verbal cues General Comments: Patient just finishing with OT on arrival; she could not recall precautions (even when given "BLT" as a prompt); repeated questioning throughout session with pt continueing to have difficulty recalling precautions and requiring cues to prevent twisting during sit to sidelying and when approaching chair to sit      General Comments      Exercises     Assessment/Plan    PT Assessment All further PT needs can be met in the next venue of care  PT Problem List Decreased activity tolerance;Decreased balance;Decreased mobility;Decreased knowledge of use of DME;Decreased cognition;Decreased knowledge of precautions;Obesity;Pain       PT Treatment Interventions      PT Goals (Current goals can be found in the Care Plan section)  Acute Rehab PT Goals Patient Stated Goal: returning home PT Goal Formulation: All assessment and education complete, DC therapy (MD in during session and plans to d/c home today)    Frequency     Barriers to discharge        Co-evaluation               AM-PAC PT "6 Clicks" Mobility  Outcome Measure Help needed turning from your back to your side while in a flat bed without using bedrails?: None Help needed moving from lying on your back to sitting on the side of a flat bed without using bedrails?:  A Little Help needed moving to and from a bed to a chair (including a wheelchair)?: A Little Help needed standing up from a chair using your arms (e.g., wheelchair or bedside chair)?: A Little Help needed to walk in hospital room?: A Little Help needed climbing 3-5 steps with a railing? : A Little 6 Click Score: 19    End of Session   Activity Tolerance: Patient tolerated treatment well Patient left: in chair;with call bell/phone within reach Nurse Communication: Mobility status (needs RW prior to discharge; MD made aware) PT Visit Diagnosis: Unsteadiness on feet (R26.81);Pain Pain - Right/Left:  (midline) Pain - part of body:  (back)    Time: 0109-3235 PT Time Calculation (min) (ACUTE ONLY): 22 min   Charges:   PT Evaluation $PT Eval Low Complexity: 1 Low           Arby Barrette, PT Pager [phone]   Rexanne Mano 08/25/2020, 9:16 AM

## 2020-08-26 ENCOUNTER — Telehealth: Payer: Self-pay

## 2020-08-26 NOTE — Telephone Encounter (Signed)
Transition Care Management Unsuccessful Follow-up Telephone Call  Date of discharge and from where:  08/25/20 [facility]  Attempts:  1st Attempt  Reason for unsuccessful TCM follow-up call:  Unable to leave message

## 2020-08-30 NOTE — Telephone Encounter (Signed)
Transition Care Management Unsuccessful Follow-up Telephone Call  Date of discharge and from where:  08/25/20 [facility]  Attempts:  2nd Attempt  Reason for unsuccessful TCM follow-up call:  No answer/busy

## 2020-08-31 ENCOUNTER — Telehealth: Payer: Self-pay | Admitting: Specialist

## 2020-08-31 NOTE — Telephone Encounter (Signed)
Please schedule her 2 week post op on 09/08/20 or 09/09/20 with Zonia Kief, PA-C --he can see these for their 1st post op visits at 2 weeks.

## 2020-08-31 NOTE — Telephone Encounter (Signed)
Pt called to scheduled her two week post op visit and there is nothing available until the beginning of february please give her a call

## 2020-08-31 NOTE — Telephone Encounter (Signed)
Transition Care Management Follow-up Telephone Call  Date of discharge and from where: 08/25/2020 [facility]  How have you been since you were released from the hospital? Doing better,  Any questions or concerns? No  Items Reviewed:  Did the pt receive and understand the discharge instructions provided? Yes   Medications obtained and verified? Yes   Other? No   Any new allergies since your discharge? No   Dietary orders reviewed? Yes  Do you have support at home? Yes    Functional Questionnaire: (I = Independent and D = Dependent) ADLs: I  Bathing/Dressing- I  Meal Prep- I  Eating- I  Maintaining continence- I  Transferring/Ambulation- I  Managing Meds- I  Follow up appointments reviewed:   PCP Hospital f/u appt confirmed? No    Specialist Hospital f/u appt confirmed? Yes  Scheduled to see Saintclair Halsted, PA at [facility] on 09/09/2020 @ 1:45pm.  Are transportation arrangements needed? No   If their condition worsens, is the pt aware to call PCP or go to the Emergency Dept.? Yes  Was the patient provided with contact information for the PCP's office or ED? Yes  Was to pt encouraged to call back with questions or concerns? Yes

## 2020-09-07 NOTE — Discharge Summary (Signed)
Patient ID: Kristin Saunders MRN: [id number] DOB/AGE: 29/21/1992 28 y.o.  Admit date: 08/24/2020 Discharge date: 09/07/2020  Admission Diagnoses:  Principal Problem:   Spondylolisthesis at L5-S1 level Active Problems:   Spondylolysis   Chronic bilateral low back pain with bilateral sciatica   Status post lumbar spinal fusion   Discharge Diagnoses:  Principal Problem:   Spondylolisthesis at L5-S1 level Active Problems:   Spondylolysis   Chronic bilateral low back pain with bilateral sciatica   Status post lumbar spinal fusion  status post Procedure(s): TRANSFORAMINAL LUMBAR INTERBODY FUSION LUMBAR 5-SACRAL 1 WITH SCREWS, RODS AND CAGES, VIVIGEN, LOCAL BONE GRAFT, ALLOGRAFT BONE GRAFT  Past Medical History:  Diagnosis Date  . Anxiety   . Depression   . GERD (gastroesophageal reflux disease)   . Headache   . Medical history non-contributory     Surgeries: Procedure(s): TRANSFORAMINAL LUMBAR INTERBODY FUSION LUMBAR 5-SACRAL 1 WITH SCREWS, RODS AND CAGES, VIVIGEN, LOCAL BONE GRAFT, ALLOGRAFT BONE GRAFT on 08/24/2020   Consultants:   Discharged Condition: Improved  Hospital Course: Kristin Saunders is an 30 y.o. female who was admitted 08/24/2020 for operative treatment of Spondylolisthesis at L5-S1 level. Patient failed conservative treatments (please see the history and physical for the specifics) and had severe unremitting pain that affects sleep, daily activities and work/hobbies. After pre-op clearance, the patient was taken to the operating room on 08/24/2020 and underwent  Procedure(s): TRANSFORAMINAL LUMBAR INTERBODY FUSION LUMBAR 5-SACRAL 1 WITH SCREWS, RODS AND CAGES, VIVIGEN, LOCAL BONE GRAFT, ALLOGRAFT BONE GRAFT.    Patient was given perioperative antibiotics:  Anti-infectives (From admission, onward)   Start     Dose/Rate Route Frequency Ordered Stop   08/24/20 1545  ceFAZolin (ANCEF) IVPB 2g/100 mL premix        2 g 200 mL/hr over 30 Minutes Intravenous Every 8 hours  08/24/20 1455 08/25/20 0000   08/24/20 0600  ceFAZolin (ANCEF) IVPB 2g/100 mL premix        2 g 200 mL/hr over 30 Minutes Intravenous On call to O.R. 08/24/20 0543 08/24/20 1157       Patient was given sequential compression devices and early ambulation to prevent DVT.   Patient benefited maximally from hospital stay and there were no complications. At the time of discharge, the patient was urinating/moving their bowels without difficulty, tolerating a regular diet, pain is controlled with oral pain medications and they have been cleared by PT/OT.   Recent vital signs: No data found.   Recent laboratory studies: No results for input(s): WBC, HGB, HCT, PLT, NA, K, CL, CO2, BUN, CREATININE, GLUCOSE, INR, CALCIUM in the last 72 hours.  Invalid input(s): PT, 2   Discharge Medications:   Allergies as of 08/25/2020   No Known Allergies     Medication List    STOP taking these medications   traMADol 50 MG tablet Commonly known as: ULTRAM     TAKE these medications   cetirizine 10 MG tablet Commonly known as: ZYRTEC Take 10 mg by mouth daily.   docusate sodium 100 MG capsule Commonly known as: COLACE Take 1 capsule (100 mg total) by mouth 2 (two) times daily.   escitalopram 10 MG tablet Commonly known as: Lexapro Take 1 tablet (10 mg total) by mouth daily.   fluticasone 50 MCG/ACT nasal spray Commonly known as: FLONASE Place 1 spray into both nostrils daily as needed for allergies or rhinitis.   methocarbamol 500 MG tablet Commonly known as: ROBAXIN Take 1 tablet (500 mg total) by mouth every  6 (six) hours as needed for muscle spasms.   multivitamin with minerals Tabs tablet Take 1 tablet by mouth daily.   topiramate 25 MG tablet Commonly known as: TOPAMAX Take 50 mg by mouth at bedtime.     ASK your doctor about these medications   HYDROcodone-acetaminophen 7.5-325 MG tablet Commonly known as: NORCO Take 1 tablet by mouth every 4 (four) hours as needed for up to 7  days for moderate pain ((score 4 to 6)). Ask about: Should I take this medication?       Diagnostic Studies: DG Lumbar Spine Complete  Result Date: 08/24/2020 CLINICAL DATA:  L5-S1 TLIF EXAM: LUMBAR SPINE - COMPLETE 4+ VIEW; DG C-ARM 1-60 MIN COMPARISON:  Lumbar spine radiographs dated 03/31/2020 FLUOROSCOPY TIME:  1 minutes 22 seconds 58.67 mGy FINDINGS: Intraoperative fluoroscopic radiographs during L5-S1 posterior fixation with interbody spacer. IMPRESSION: Intraoperative fluoroscopic radiographs during L5-S1 posterior fixation. Electronically Signed   By: Charline Bills M.D.   On: 08/24/2020 12:39   DG C-Arm 1-60 Min  Result Date: 08/24/2020 CLINICAL DATA:  L5-S1 TLIF EXAM: LUMBAR SPINE - COMPLETE 4+ VIEW; DG C-ARM 1-60 MIN COMPARISON:  Lumbar spine radiographs dated 03/31/2020 FLUOROSCOPY TIME:  1 minutes 22 seconds 58.67 mGy FINDINGS: Intraoperative fluoroscopic radiographs during L5-S1 posterior fixation with interbody spacer. IMPRESSION: Intraoperative fluoroscopic radiographs during L5-S1 posterior fixation. Electronically Signed   By: Charline Bills M.D.   On: 08/24/2020 12:39    Discharge Instructions    Call MD / Call 911   Complete by: As directed    If you experience chest pain or shortness of breath, CALL 911 and be transported to the hospital emergency room.  If you develope a fever above 101 F, pus (white drainage) or increased drainage or redness at the wound, or calf pain, call your surgeon's office.   Constipation Prevention   Complete by: As directed    Drink plenty of fluids.  Prune juice may be helpful.  You may use a stool softener, such as Colace (over the counter) 100 mg twice a day.  Use MiraLax (over the counter) for constipation as needed.   Diet - low sodium heart healthy   Complete by: As directed    Discharge instructions   Complete by: As directed    Call if there is increasing drainage, fever greater than 101.5, severe head aches, and worsening nausea  or light sensitivity. If shortness of breath, bloody cough or chest tightness or pain go to an emergency room. No lifting greater than 10 lbs. Avoid bending, stooping and twisting. Use brace when sitting and out of bed even to go to bathroom. Walk in house for first 2 weeks then may start to get out slowly increasing distances up to one mile by 4-6 weeks post op. After 5 days may shower and change dressing following bathing with shower.When bathing remove the brace shower and replace brace before getting out of the shower. If drainage, keep dry dressing and do not bathe the incision, use an moisture impervious dressing. Please call and return for scheduled follow up appointment 2 weeks from the time of surgery.   Driving restrictions   Complete by: As directed    No driving for 2 weeks   Increase activity slowly as tolerated   Complete by: As directed    Lifting restrictions   Complete by: As directed    No lifting for 12 weeks       Follow-up Information    Kerrin Champagne, MD  In 2 weeks.   Specialty: Orthopedic Surgery Why: For wound re-check Contact information: [address] [place] [address] [phone]               Discharge Plan:  discharge to home  Disposition:     Signed: Zonia Kief  09/07/2020, 3:53 PM

## 2020-09-08 ENCOUNTER — Telehealth: Payer: Self-pay

## 2020-09-08 NOTE — Telephone Encounter (Signed)
Patient called stating that she is having pain and tingling in her right leg and that she saw some blood clots in the toilet while using the restroom this morning.  Patient had surgery on 08/24/2020 and patient has an appointment on Thursday, 09/09/2020.  CB# [phone].  Please advise.  Thank you.

## 2020-09-09 ENCOUNTER — Other Ambulatory Visit: Payer: Self-pay

## 2020-09-09 ENCOUNTER — Ambulatory Visit: Payer: Self-pay

## 2020-09-09 ENCOUNTER — Ambulatory Visit (INDEPENDENT_AMBULATORY_CARE_PROVIDER_SITE_OTHER): Payer: Medicaid Other | Admitting: Specialist

## 2020-09-09 ENCOUNTER — Ambulatory Visit: Payer: Medicaid Other | Admitting: Surgery

## 2020-09-09 ENCOUNTER — Encounter: Payer: Self-pay | Admitting: Specialist

## 2020-09-09 VITALS — BP 101/72 | HR 87 | Ht 66.0 in | Wt 217.0 lb

## 2020-09-09 DIAGNOSIS — Z981 Arthrodesis status: Secondary | ICD-10-CM

## 2020-09-09 NOTE — Progress Notes (Signed)
   Post-Op Visit Note   Patient: Azzure Garabedian           Date of Birth: Jul 09, 1991           MRN: [id number] Visit Date: 09/09/2020 PCP: Junie Spencer, FNP   Assessment & Plan:2 weeks post op doing well.  Chief Complaint:  Chief Complaint  Patient presents with  . Lower Back - Routine Post Op   Visit Diagnoses:  1. S/P lumbar fusion   Sitting comfortably, legs are NV normal.  Motor is normal. No brace, she is uncomfortable using the brace, advised to use while out of bed as much as possible. Radiographs.   Plan: Avoid frequent bending and stooping  No lifting greater than 10 lbs. May use ice or moist heat for pain. Weight loss is of benefit. Best medication for lumbar disc disease is arthritis medications like motrin, celebrex and naprosyn. Exercise is important to improve your indurance and does allow people to function better inspite of back pain. Recommend using the lumbosacral brace when out of bed. Braces are not fun to wear, with the staples out hopefully You may be able to wear the brace more comfortably. Meds for discomfort as needed, call if meds are needed.    Follow-Up Instructions: No follow-ups on file.   Orders:  Orders Placed This Encounter  Procedures  . XR Lumbar Spine 2-3 Views   No orders of the defined types were placed in this encounter.   Imaging: No results found.  PMFS History: Patient Active Problem List   Diagnosis Date Noted  . Spondylolisthesis at L5-S1 level 03/11/2019    Priority: High  . Spondylolysis 03/11/2019    Priority: High  . Chronic bilateral low back pain with bilateral sciatica 03/11/2019    Priority: High  . Status post lumbar spinal fusion 08/24/2020  . Obesity (BMI 30.0-34.9) 03/19/2018   Past Medical History:  Diagnosis Date  . Anxiety   . Depression   . GERD (gastroesophageal reflux disease)   . Headache   . Medical history non-contributory     Family History  Problem Relation Age of Onset  . Cancer  Father        COLON    Past Surgical History:  Procedure Laterality Date  . NO PAST SURGERIES    . TUBAL LIGATION     Social History   Occupational History  . Not on file  Tobacco Use  . Smoking status: Never Smoker  . Smokeless tobacco: Never Used  Vaping Use  . Vaping Use: Never used  Substance and Sexual Activity  . Alcohol use: Yes    Comment: rare  . Drug use: No  . Sexual activity: Yes    Birth control/protection: None, Surgical

## 2020-09-10 NOTE — Progress Notes (Signed)
Patient's appointment cancelled after being brought to her room, not seen by physician.

## 2020-09-16 DIAGNOSIS — J020 Streptococcal pharyngitis: Secondary | ICD-10-CM | POA: Diagnosis not present

## 2020-09-16 DIAGNOSIS — R110 Nausea: Secondary | ICD-10-CM | POA: Diagnosis not present

## 2020-09-16 DIAGNOSIS — R6889 Other general symptoms and signs: Secondary | ICD-10-CM | POA: Diagnosis not present

## 2020-09-16 DIAGNOSIS — R300 Dysuria: Secondary | ICD-10-CM | POA: Diagnosis not present

## 2020-09-16 DIAGNOSIS — J029 Acute pharyngitis, unspecified: Secondary | ICD-10-CM | POA: Diagnosis not present

## 2020-09-16 DIAGNOSIS — R059 Cough, unspecified: Secondary | ICD-10-CM | POA: Diagnosis not present

## 2020-09-27 ENCOUNTER — Ambulatory Visit (INDEPENDENT_AMBULATORY_CARE_PROVIDER_SITE_OTHER): Payer: Medicaid Other | Admitting: Nurse Practitioner

## 2020-09-27 ENCOUNTER — Encounter: Payer: Self-pay | Admitting: Nurse Practitioner

## 2020-09-27 DIAGNOSIS — J029 Acute pharyngitis, unspecified: Secondary | ICD-10-CM | POA: Diagnosis not present

## 2020-09-27 MED ORDER — AMOXICILLIN-POT CLAVULANATE 875-125 MG PO TABS: 1.0000 | ORAL_TABLET | Freq: Two times a day (BID) | ORAL | 0 refills | Status: DC

## 2020-09-27 NOTE — Patient Instructions (Signed)

## 2020-09-27 NOTE — Assessment & Plan Note (Signed)
Sore throat symptoms not well controlled.  Patient is reporting new headache, sore throat, and cough.  COVID-19 test ordered results pending, strep throat ordered results pending.  Started patient on Augmentin.  Follow-up with worsening unresolved symptoms.  Education provided to patient patient verbalized understanding. Rx sent to pharmacy.

## 2020-09-27 NOTE — Progress Notes (Addendum)
   Virtual Visit via telephone Note Due to COVID-19 pandemic this visit was conducted virtually. This visit type was conducted due to national recommendations for restrictions regarding the COVID-19 Pandemic (e.g. social distancing, sheltering in place) in an effort to limit this patient's exposure and mitigate transmission in our community. All issues noted in this document were discussed and addressed.  A physical exam was not performed with this format.  I connected with Kristin Saunders on 09/27/20 at  11:50 AM by telephone and verified that I am speaking with the correct person using two identifiers. Kristin Saunders is currently located at home during visit. The provider, Daryll Drown, NP is located in their office at time of visit.  I discussed the limitations, risks, security and privacy concerns of performing an evaluation and management service by telephone and the availability of in person appointments. I also discussed with the patient that there may be a patient responsible charge related to this service. The patient expressed understanding and agreed to proceed.   History and Present Illness:  Sore Throat  This is a new problem. Episode onset: In the past 6 days. The problem has been gradually worsening. Neither side of throat is experiencing more pain than the other. There has been no fever. The pain is moderate. Associated symptoms include coughing and a hoarse voice. Pertinent negatives include no abdominal pain, congestion, headaches, shortness of breath or stridor. She has tried nothing for the symptoms.      Review of Systems  Constitutional: Negative for chills and fever.  HENT: Positive for hoarse voice. Negative for congestion.   Respiratory: Positive for cough. Negative for shortness of breath and stridor.   Gastrointestinal: Negative for abdominal pain.  Neurological: Negative for headaches.  All other systems reviewed and are  negative.    Observations/Objective: Televisit-patient did not sound to be in distress.  Assessment and Plan:  Sore throat Sore throat symptoms not well controlled.  Patient is reporting new headache, sore throat, and cough.  COVID-19 test ordered results pending, strep throat ordered results pending.  Started patient on Augmentin.  Follow-up with worsening unresolved symptoms.  Education provided to patient patient verbalized understanding. Rx sent to pharmacy.     Follow Up Instructions: Follow-up with worsening or unresolved symptoms   I discussed the assessment and treatment plan with the patient. The patient was provided an opportunity to ask questions and all were answered. The patient agreed with the plan and demonstrated an understanding of the instructions.   The patient was advised to call back or seek an in-person evaluation if the symptoms worsen or if the condition fails to improve as anticipated.  The above assessment and management plan was discussed with the patient. The patient verbalized understanding of and has agreed to the management plan. Patient is aware to call the clinic if symptoms persist or worsen. Patient is aware when to return to the clinic for a follow-up visit. Patient educated on when it is appropriate to go to the emergency department.   Time call ended: 12:02 PM  I provided 12 minutes of non-face-to-face time during this encounter.    Daryll Drown, NP

## 2020-10-01 ENCOUNTER — Ambulatory Visit (INDEPENDENT_AMBULATORY_CARE_PROVIDER_SITE_OTHER): Payer: Medicaid Other | Admitting: Nurse Practitioner

## 2020-10-01 DIAGNOSIS — J029 Acute pharyngitis, unspecified: Secondary | ICD-10-CM | POA: Diagnosis not present

## 2020-10-01 NOTE — Assessment & Plan Note (Signed)
Patient called back today to report symptoms not well controlled. Patient was seen. Televisit on 09/28/2019. Augmentin 875-125 mg ordered for 7 days. Advised patient to complete antibiotic as prescribed and follow-up with worsening or unresolved symptoms. Patient verbalized understanding.

## 2020-10-01 NOTE — Progress Notes (Signed)
   Virtual Visit via telephone Note Due to COVID-19 pandemic this visit was conducted virtually. This visit type was conducted due to national recommendations for restrictions regarding the COVID-19 Pandemic (e.g. social distancing, sheltering in place) in an effort to limit this patient's exposure and mitigate transmission in our community. All issues noted in this document were discussed and addressed.  A physical exam was not performed with this format.  I connected with Kristin Saunders on 10/01/20 at  2 PM yellow ligament by telephone and verified that I am speaking with the correct person using two identifiers. Kristin Saunders is currently located at home during visit. The provider, Daryll Drown, NP is located in their office at time of visit.  I discussed the limitations, risks, security and privacy concerns of performing an evaluation and management service by telephone and the availability of in person appointments. I also discussed with the patient that there may be a patient responsible charge related to this service. The patient expressed understanding and agreed to proceed.   History and Present Illness:  Sore Throat  This is a recurrent problem. The current episode started in the past 7 days. The problem has been unchanged. Neither side of throat is experiencing more pain than the other. There has been no fever. The pain is moderate. Pertinent negatives include no congestion, coughing, drooling, ear discharge, headaches or shortness of breath. She has had no exposure to strep.      Review of Systems  Constitutional: Negative for chills and fever.  HENT: Positive for sore throat. Negative for congestion, drooling and ear discharge.   Eyes: Negative.   Respiratory: Negative for cough and shortness of breath.   Cardiovascular: Negative.   Gastrointestinal: Negative.   Genitourinary: Negative.   Musculoskeletal: Negative.   Skin: Negative.   Neurological: Negative for headaches.      Observations/Objective: Tele visit-patient did not sound in distress  Assessment and Plan: Sore throat Patient called back today to report symptoms not well controlled. Patient was seen. Televisit on 09/28/2019. Augmentin 875-125 mg ordered for 7 days. Advised patient to complete antibiotic as prescribed and follow-up with worsening or unresolved symptoms. Patient verbalized understanding.     Follow Up Instructions: Follow-up with worsening unresolved symptoms    I discussed the assessment and treatment plan with the patient. The patient was provided an opportunity to ask questions and all were answered. The patient agreed with the plan and demonstrated an understanding of the instructions.   The patient was advised to call back or seek an in-person evaluation if the symptoms worsen or if the condition fails to improve as anticipated.  The above assessment and management plan was discussed with the patient. The patient verbalized understanding of and has agreed to the management plan. Patient is aware to call the clinic if symptoms persist or worsen. Patient is aware when to return to the clinic for a follow-up visit. Patient educated on when it is appropriate to go to the emergency department.   Time call ended: 2:10 PM  I provided 10 minutes of non-face-to-face time during this encounter.    Daryll Drown, NP

## 2020-10-07 ENCOUNTER — Other Ambulatory Visit: Payer: Self-pay

## 2020-10-07 ENCOUNTER — Encounter: Payer: Self-pay | Admitting: Specialist

## 2020-10-07 ENCOUNTER — Ambulatory Visit (INDEPENDENT_AMBULATORY_CARE_PROVIDER_SITE_OTHER): Payer: Medicaid Other

## 2020-10-07 ENCOUNTER — Ambulatory Visit (INDEPENDENT_AMBULATORY_CARE_PROVIDER_SITE_OTHER): Payer: Medicaid Other | Admitting: Specialist

## 2020-10-07 VITALS — BP 110/77 | HR 94 | Ht 66.0 in | Wt 217.0 lb

## 2020-10-07 DIAGNOSIS — Z981 Arthrodesis status: Secondary | ICD-10-CM

## 2020-10-07 DIAGNOSIS — M4317 Spondylolisthesis, lumbosacral region: Secondary | ICD-10-CM

## 2020-10-07 NOTE — Patient Instructions (Addendum)
Plan: Avoid frequent bending and stooping  No lifting greater than 10-15 lbs. May use ice or moist heat for pain. Weight loss is of benefit. Best medication for lumbar disc disease is arthritis medications like motrin, celebrex and naprosyn. Exercise is important to improve your indurance and does allow people to function better inspite of back pain. Walking up to 1 mile per day. Start light stomach crunches and extension exercises.

## 2020-10-07 NOTE — Progress Notes (Signed)
   Post-Op Visit Note   Patient: Kristin Saunders           Date of Birth: April 20, 1991           MRN: [id number] Visit Date: 10/07/2020 PCP: Junie Spencer, FNP   Assessment & Plan:6 weeks post L5-S1 fusion for spondylolisthesis.  Chief Complaint:  Chief Complaint  Patient presents with  . Lower Back - Routine Post Op   Visit Diagnoses:  1. S/P lumbar fusion   2. Spondylolisthesis at L5-S1 level   Incision is healed. SLR is negative Motor is normal.  Plan: Avoid frequent bending and stooping  No lifting greater than 10 lbs. May use ice or moist heat for pain. Weight loss is of benefit. Best medication for lumbar disc disease is arthritis medications like motrin, celebrex and naprosyn. Exercise is important to improve your indurance and does allow people to function better inspite of back pain. Walking up to 1 mile per day. Start light stomach crunches and extension exercises Use the brace when out of bed may remove for 1/2 days. Follow-Up Instructions: Return in about 6 weeks (around 11/18/2020).   Orders:  Orders Placed This Encounter  Procedures  . XR Lumbar Spine 2-3 Views   No orders of the defined types were placed in this encounter.   Imaging: No results found.  PMFS History: Patient Active Problem List   Diagnosis Date Noted  . Spondylolisthesis at L5-S1 level 03/11/2019    Priority: High  . Spondylolysis 03/11/2019    Priority: High  . Chronic bilateral low back pain with bilateral sciatica 03/11/2019    Priority: High  . Sore throat 09/27/2020  . Status post lumbar spinal fusion 08/24/2020  . Obesity (BMI 30.0-34.9) 03/19/2018   Past Medical History:  Diagnosis Date  . Anxiety   . Depression   . GERD (gastroesophageal reflux disease)   . Headache   . Medical history non-contributory     Family History  Problem Relation Age of Onset  . Cancer Father        COLON    Past Surgical History:  Procedure Laterality Date  . NO PAST SURGERIES     . TUBAL LIGATION     Social History   Occupational History  . Not on file  Tobacco Use  . Smoking status: Never Smoker  . Smokeless tobacco: Never Used  Vaping Use  . Vaping Use: Never used  Substance and Sexual Activity  . Alcohol use: Yes    Comment: rare  . Drug use: No  . Sexual activity: Yes    Birth control/protection: None, Surgical

## 2020-10-26 ENCOUNTER — Encounter: Payer: Self-pay | Admitting: Family

## 2020-10-26 ENCOUNTER — Ambulatory Visit (INDEPENDENT_AMBULATORY_CARE_PROVIDER_SITE_OTHER): Payer: Medicaid Other | Admitting: Family

## 2020-10-26 DIAGNOSIS — B373 Candidiasis of vulva and vagina: Secondary | ICD-10-CM | POA: Diagnosis not present

## 2020-10-26 DIAGNOSIS — J069 Acute upper respiratory infection, unspecified: Secondary | ICD-10-CM | POA: Diagnosis not present

## 2020-10-26 DIAGNOSIS — J029 Acute pharyngitis, unspecified: Secondary | ICD-10-CM | POA: Diagnosis not present

## 2020-10-26 DIAGNOSIS — B3731 Acute candidiasis of vulva and vagina: Secondary | ICD-10-CM

## 2020-10-26 MED ORDER — FLUCONAZOLE 150 MG PO TABS: 150.0000 mg | ORAL_TABLET | ORAL | 0 refills | Status: DC | PRN

## 2020-10-26 MED ORDER — PREDNISONE 10 MG (21) PO TBPK: ORAL_TABLET | ORAL | 0 refills | Status: DC

## 2020-10-26 MED ORDER — FEXOFENADINE HCL 180 MG PO TABS: 180.0000 mg | ORAL_TABLET | Freq: Every day | ORAL | 1 refills | Status: DC

## 2020-10-26 NOTE — Progress Notes (Signed)
Virtual Visit via telephone Note Due to COVID-19 pandemic this visit was conducted virtually. This visit type was conducted due to national recommendations for restrictions regarding the COVID-19 Pandemic (e.g. social distancing, sheltering in place) in an effort to limit this patient's exposure and mitigate transmission in our community. All issues noted in this document were discussed and addressed.  A physical exam was not performed with this format.  I connected with Kristin Saunders on 10/26/20 at 12:50 pm  by telephone and verified that I am speaking with the correct person using two identifiers. Kristin Saunders is currently located at home  and no one is currently with her during visit. The provider, [name], FNP is located in their office at time of visit.  I discussed the limitations, risks, security and privacy concerns of performing an evaluation and management service by telephone and the availability of in person appointments. I also discussed with the patient that there may be a patient responsible charge related to this service. The patient expressed understanding and agreed to proceed.   History and Present Illness:  Pt calls the office today with recurrent sore throat and cough. She was treated with Augmentin 09/27/20 and completed this.  Sore Throat  This is a recurrent problem. The current episode started 1 to 4 weeks ago. The problem has been waxing and waning. There has been no fever. The pain is at a severity of 7/10. The pain is mild. Associated symptoms include congestion, coughing, headaches ("some times") and a hoarse voice. Pertinent negatives include no ear pain, plugged ear sensation, shortness of breath or stridor. She has tried acetaminophen for the symptoms. The treatment provided mild relief.  Vaginal Itching The patient's primary symptoms include genital itching. This is a new problem. The current episode started 1 to 4 weeks ago. The problem occurs  intermittently. Associated symptoms include headaches ("some times").      Review of Systems  HENT: Positive for congestion and hoarse voice. Negative for ear pain.   Respiratory: Positive for cough. Negative for shortness of breath and stridor.   Neurological: Positive for headaches ("some times").  All other systems reviewed and are negative.    Observations/Objective: No SOB or distress noted   Assessment and Plan: 1. Viral URI Start Allegra and stop Zyrtec Avoid allergens  Force fluids  Mucinex as needed  - fexofenadine (ALLEGRA ALLERGY) 180 MG tablet; Take 1 tablet (180 mg total) by mouth daily.  Dispense: 90 tablet; Refill: 1 - predniSONE (STERAPRED UNI-PAK 21 TAB) 10 MG (21) TBPK tablet; Use as directed  Dispense: 21 tablet; Refill: 0  2. Acute pharyngitis, unspecified etiology  - fexofenadine (ALLEGRA ALLERGY) 180 MG tablet; Take 1 tablet (180 mg total) by mouth daily.  Dispense: 90 tablet; Refill: 1 - predniSONE (STERAPRED UNI-PAK 21 TAB) 10 MG (21) TBPK tablet; Use as directed  Dispense: 21 tablet; Refill: 0  3. Vagina, candidiasis Keep clean and dry - fluconazole (DIFLUCAN) 150 MG tablet; Take 1 tablet (150 mg total) by mouth every three (3) days as needed.  Dispense: 3 tablet; Refill: 0     I discussed the assessment and treatment plan with the patient. The patient was provided an opportunity to ask questions and all were answered. The patient agreed with the plan and demonstrated an understanding of the instructions.   The patient was advised to call back or seek an in-person evaluation if the symptoms worsen or if the condition fails to improve as anticipated.  The above assessment and  management plan was discussed with the patient. The patient verbalized understanding of and has agreed to the management plan. Patient is aware to call the clinic if symptoms persist or worsen. Patient is aware when to return to the clinic for a follow-up visit. Patient educated on  when it is appropriate to go to the emergency department.   Time call ended:  1:01 pm   I provided 11 minutes of non-face-to-face time during this encounter.    [name], FNP

## 2020-11-02 ENCOUNTER — Encounter: Payer: Self-pay | Admitting: Specialist

## 2020-11-03 DIAGNOSIS — R6889 Other general symptoms and signs: Secondary | ICD-10-CM | POA: Diagnosis not present

## 2020-11-03 DIAGNOSIS — J029 Acute pharyngitis, unspecified: Secondary | ICD-10-CM | POA: Diagnosis not present

## 2020-11-03 DIAGNOSIS — N3001 Acute cystitis with hematuria: Secondary | ICD-10-CM | POA: Diagnosis not present

## 2020-11-03 DIAGNOSIS — J00 Acute nasopharyngitis [common cold]: Secondary | ICD-10-CM | POA: Diagnosis not present

## 2020-11-03 DIAGNOSIS — R059 Cough, unspecified: Secondary | ICD-10-CM | POA: Diagnosis not present

## 2020-11-14 ENCOUNTER — Encounter (HOSPITAL_COMMUNITY): Payer: Self-pay | Admitting: Emergency Medicine

## 2020-11-14 ENCOUNTER — Emergency Department (HOSPITAL_COMMUNITY)
Admission: EM | Admit: 2020-11-14 | Discharge: 2020-11-14 | Disposition: A | Discharge status: Home / Self Care | Payer: Medicaid Other | Attending: Emergency Medicine | Admitting: Emergency Medicine

## 2020-11-14 ENCOUNTER — Other Ambulatory Visit: Payer: Self-pay

## 2020-11-14 DIAGNOSIS — J029 Acute pharyngitis, unspecified: Secondary | ICD-10-CM | POA: Insufficient documentation

## 2020-11-14 NOTE — Discharge Instructions (Addendum)
Call Dr. Lucky Rathke office tomorrow and tell them you are in the emergency department and we feel like you need to be seen in the next week.  Make sure you they know also that you have had this sore throat for quite some time and has been treated many times

## 2020-11-14 NOTE — ED Triage Notes (Signed)
Patient here from home reporting ongoing sore throat x1 month. States that she tested positive for covid last month and has been on multiple antibiotics for throat and steroids with no relief.

## 2020-11-14 NOTE — ED Provider Notes (Addendum)
[facility]-EMERGENCY DEPT Provider Note   CSN: [id number] Arrival date & time: 11/14/20  1246     History Chief Complaint  Patient presents with  . Sore Throat    Kristin Saunders is a 30 y.o. female.  Patient with a persistent sore throat for a number weeks.  She has been on antibiotics several times along with prednisone.  The history is provided by the patient and medical records. No language interpreter was used.  Sore Throat This is a chronic problem. The current episode started more than 1 week ago. The problem occurs constantly. The problem has not changed since onset.Pertinent negatives include no chest pain, no abdominal pain and no headaches. Nothing aggravates the symptoms. Nothing relieves the symptoms. Treatments tried: Antibiotics.       Past Medical History:  Diagnosis Date  . Anxiety   . Depression   . GERD (gastroesophageal reflux disease)   . Headache   . Medical history non-contributory     Patient Active Problem List   Diagnosis Date Noted  . Sore throat 09/27/2020  . Status post lumbar spinal fusion 08/24/2020  . Spondylolisthesis at L5-S1 level 03/11/2019  . Spondylolysis 03/11/2019  . Chronic bilateral low back pain with bilateral sciatica 03/11/2019  . Obesity (BMI 30.0-34.9) 03/19/2018    Past Surgical History:  Procedure Laterality Date  . NO PAST SURGERIES    . TUBAL LIGATION       OB History    Gravida  2   Para  1   Term  1   Preterm      AB      Living  1     SAB      IAB      Ectopic      Multiple      Live Births              Family History  Problem Relation Age of Onset  . Cancer Father        COLON    Social History   Tobacco Use  . Smoking status: Never Smoker  . Smokeless tobacco: Never Used  Vaping Use  . Vaping Use: Never used  Substance Use Topics  . Alcohol use: Yes    Comment: rare  . Drug use: No    Home Medications Prior to Admission medications    Medication Sig Start Date End Date Taking? Authorizing Provider  docusate sodium (COLACE) 100 MG capsule Take 1 capsule (100 mg total) by mouth 2 (two) times daily. 08/25/20   Kerrin Champagne, MD  fexofenadine [facility] ALLERGY) 180 MG tablet Take 1 tablet (180 mg total) by mouth daily. 10/26/20   Junie Spencer, FNP  fluconazole (DIFLUCAN) 150 MG tablet Take 1 tablet (150 mg total) by mouth every three (3) days as needed. 10/26/20   Junie Spencer, FNP  fluticasone (FLONASE) 50 MCG/ACT nasal spray Place 1 spray into both nostrils daily as needed for allergies or rhinitis.    [provider]  Multiple Vitamin (MULTIVITAMIN WITH MINERALS) TABS tablet Take 1 tablet by mouth daily.    [provider]  predniSONE (STERAPRED UNI-PAK 21 TAB) 10 MG (21) TBPK tablet Use as directed 10/26/20   Junie Spencer, FNP    Allergies    Patient has no known allergies.  Review of Systems   Review of Systems  Constitutional: Negative for appetite change and fatigue.  HENT: Negative for congestion, ear discharge and sinus pressure.  Sore throat  Eyes: Negative for discharge.  Respiratory: Negative for cough.   Cardiovascular: Negative for chest pain.  Gastrointestinal: Negative for abdominal pain and diarrhea.  Genitourinary: Negative for frequency and hematuria.  Musculoskeletal: Negative for back pain.  Skin: Negative for rash.  Neurological: Negative for seizures and headaches.  Psychiatric/Behavioral: Negative for hallucinations.    Physical Exam Updated Vital Signs BP 121/83 (BP Location: Left Arm)   Pulse 71   Temp 98 F (36.7 C) (Oral)   Resp 16   SpO2 98%   Physical Exam Vitals and nursing note reviewed.  Constitutional:      Appearance: She is well-developed.  HENT:     Head: Normocephalic.     Nose: Nose normal.  Eyes:     General: No scleral icterus.    Conjunctiva/sclera: Conjunctivae normal.  Neck:     Thyroid: No thyromegaly.  Cardiovascular:     Rate  and Rhythm: Normal rate and regular rhythm.     Heart sounds: No murmur heard. No friction rub. No gallop.   Pulmonary:     Breath sounds: No stridor. No wheezing or rales.  Chest:     Chest wall: No tenderness.  Abdominal:     General: There is no distension.     Tenderness: There is no abdominal tenderness. There is no rebound.  Musculoskeletal:        General: Normal range of motion.     Cervical back: Neck supple.  Lymphadenopathy:     Cervical: No cervical adenopathy.  Skin:    Findings: No erythema or rash.  Neurological:     Mental Status: She is alert and oriented to person, place, and time.     Motor: No abnormal muscle tone.     Coordination: Coordination normal.  Psychiatric:        Behavior: Behavior normal.     ED Results / Procedures / Treatments   Labs (all labs ordered are listed, but only abnormal results are displayed) Labs Reviewed - No data to display  EKG None  Radiology No results found.  Procedures Procedures   Medications Ordered in ED Medications - No data to display  ED Course  I have reviewed the triage vital signs and the nursing notes.  Pertinent labs & imaging results that were available during my care of the patient were reviewed by me and considered in my medical decision making (see chart for details).    MDM Rules/Calculators/A&P                          Patient with persistent sore throat.  She is referred to ENT Final Clinical Impression(s) / ED Diagnoses Final diagnoses:  Pharyngitis, unspecified etiology    Rx / DC Orders ED Discharge Orders    None       Bethann Berkshire, MD 11/16/20 1002    Bethann Berkshire, MD 11/16/20 1003

## 2020-11-15 ENCOUNTER — Ambulatory Visit: Payer: Medicaid Other | Admitting: Specialist

## 2020-11-15 ENCOUNTER — Telehealth: Payer: Self-pay | Admitting: *Deleted

## 2020-11-15 NOTE — Telephone Encounter (Signed)
Transition Care Management Unsuccessful Follow-up Telephone Call  Date of discharge and from where:  11/14/2020 - [place] [facility]  Attempts:  1st Attempt  Reason for unsuccessful TCM follow-up call:  Left voice message 

## 2020-11-16 NOTE — Telephone Encounter (Signed)
Transition Care Management Unsuccessful Follow-up Telephone Call  Date of discharge and from where:  11/14/2020 - [facility]  Attempts:  2nd Attempt  Reason for unsuccessful TCM follow-up call:  Left voice message

## 2020-11-17 NOTE — Telephone Encounter (Signed)
Transition Care Management Follow-up Telephone Call  Date of discharge and from where: 11/14/2020 from [facility]  How have you been since you were released from the hospital? Pt states that she is feeling okay.   Any questions or concerns? No  Items Reviewed:  Did the pt receive and understand the discharge instructions provided? Yes   Medications obtained and verified? Yes   Other? No   Any new allergies since your discharge? No   Dietary orders reviewed? n/a  Do you have support at home? Yes   Functional Questionnaire: (I = Independent and D = Dependent) ADLs: I  Bathing/Dressing- I  Meal Prep- I  Eating- I  Maintaining continence- I  Transferring/Ambulation- I  Managing Meds- I  Follow up appointments reviewed:   PCP Hospital f/u appt confirmed? No    Specialist Hospital f/u appt confirmed? No    Are transportation arrangements needed? No   If their condition worsens, is the pt aware to call PCP or go to the Emergency Dept.? Yes  Was the patient provided with contact information for the PCP's office or ED? Yes  Was to pt encouraged to call back with questions or concerns? Yes

## 2020-12-09 ENCOUNTER — Other Ambulatory Visit: Payer: Self-pay

## 2020-12-09 ENCOUNTER — Ambulatory Visit (INDEPENDENT_AMBULATORY_CARE_PROVIDER_SITE_OTHER): Payer: Medicaid Other | Admitting: Specialist

## 2020-12-09 ENCOUNTER — Encounter: Payer: Self-pay | Admitting: Specialist

## 2020-12-09 ENCOUNTER — Ambulatory Visit (INDEPENDENT_AMBULATORY_CARE_PROVIDER_SITE_OTHER): Payer: Medicaid Other

## 2020-12-09 VITALS — BP 103/71 | HR 85 | Ht 66.0 in | Wt 217.0 lb

## 2020-12-09 DIAGNOSIS — Z981 Arthrodesis status: Secondary | ICD-10-CM

## 2020-12-09 DIAGNOSIS — M4317 Spondylolisthesis, lumbosacral region: Secondary | ICD-10-CM | POA: Diagnosis not present

## 2020-12-09 NOTE — Patient Instructions (Signed)
Avoid frequent bending and stooping  No lifting greater than 10 lbs. May use ice or moist heat for pain. Weight loss is of benefit. Best medication for lumbar disc disease is arthritis medications like motrin, celebrex and naprosyn. Exercise is important to improve your indurance and does allow people to function better inspite of back pain.   

## 2020-12-09 NOTE — Progress Notes (Signed)
   Office Visit Note   Patient: Kristin Saunders           Date of Birth: Jul 23, 1991           MRN: [id number] Visit Date: 12/09/2020              Requested by: Junie Spencer, FNP [address] [place],  [place] [address] PCP: Junie Spencer, FNP   Assessment & Plan: Visit Diagnoses:  1. S/P lumbar fusion   2. Spondylolisthesis at L5-S1 level     Plan: Avoid frequent bending and stooping  No lifting greater than 10 lbs. May use ice or moist heat for pain. Weight loss is of benefit. Best medication for lumbar disc disease is arthritis medications like motrin, celebrex and naprosyn. Exercise is important to improve your indurance and does allow people to function better inspite of back pain.  Follow-Up Instructions: Return in about 3 months (around 03/10/2021).   Orders:  Orders Placed This Encounter  Procedures  . XR Lumbar Spine 2-3 Views   No orders of the defined types were placed in this encounter.     Procedures: No procedures performed   Clinical Data: No additional findings.   Subjective: Chief Complaint  Patient presents with  . Lower Back - Routine Post Op    HPI  Review of Systems   Objective: Vital Signs: BP 103/71   Pulse 85   Ht 5\' 6"  (1.676 m)   Wt 217 lb (98.4 kg)   BMI 35.02 kg/m   Physical Exam  Ortho Exam  Specialty Comments:  No specialty comments available.  Imaging: XR Lumbar Spine 2-3 Views  Result Date: 12/09/2020 AP and lateral flexion and extension radiographs demonstrate pedicle screws and rods fixing the L5-S1 level with the cage at the same level, no change in position and alignment with flexion and extension. The lateral view show some lateral rotation so that direct measurement of these views are not perfect for determining the residual motion.    PMFS History: Patient Active Problem List   Diagnosis Date Noted  . Spondylolisthesis at L5-S1 level 03/11/2019    Priority: High  . Spondylolysis 03/11/2019     Priority: High  . Chronic bilateral low back pain with bilateral sciatica 03/11/2019    Priority: High  . Sore throat 09/27/2020  . Status post lumbar spinal fusion 08/24/2020  . Obesity (BMI 30.0-34.9) 03/19/2018   Past Medical History:  Diagnosis Date  . Anxiety   . Depression   . GERD (gastroesophageal reflux disease)   . Headache   . Medical history non-contributory     Family History  Problem Relation Age of Onset  . Cancer Father        COLON    Past Surgical History:  Procedure Laterality Date  . NO PAST SURGERIES    . TUBAL LIGATION     Social History   Occupational History  . Not on file  Tobacco Use  . Smoking status: Never Smoker  . Smokeless tobacco: Never Used  Vaping Use  . Vaping Use: Never used  Substance and Sexual Activity  . Alcohol use: Yes    Comment: rare  . Drug use: No  . Sexual activity: Yes    Birth control/protection: None, Surgical

## 2020-12-21 ENCOUNTER — Encounter: Payer: Self-pay | Admitting: Family Medicine

## 2020-12-30 ENCOUNTER — Ambulatory Visit: Payer: Medicaid Other | Admitting: Nurse Practitioner

## 2020-12-30 DIAGNOSIS — N3001 Acute cystitis with hematuria: Secondary | ICD-10-CM | POA: Diagnosis not present

## 2020-12-30 DIAGNOSIS — R220 Localized swelling, mass and lump, head: Secondary | ICD-10-CM | POA: Diagnosis not present

## 2020-12-30 DIAGNOSIS — N898 Other specified noninflammatory disorders of vagina: Secondary | ICD-10-CM | POA: Diagnosis not present

## 2020-12-30 DIAGNOSIS — J029 Acute pharyngitis, unspecified: Secondary | ICD-10-CM | POA: Diagnosis not present

## 2020-12-30 DIAGNOSIS — R52 Pain, unspecified: Secondary | ICD-10-CM | POA: Diagnosis not present

## 2020-12-30 DIAGNOSIS — R059 Cough, unspecified: Secondary | ICD-10-CM | POA: Diagnosis not present

## 2021-01-20 ENCOUNTER — Ambulatory Visit: Payer: Medicaid Other | Admitting: Specialist

## 2021-02-28 DIAGNOSIS — R112 Nausea with vomiting, unspecified: Secondary | ICD-10-CM | POA: Diagnosis not present

## 2021-02-28 DIAGNOSIS — R5383 Other fatigue: Secondary | ICD-10-CM | POA: Diagnosis not present

## 2021-02-28 DIAGNOSIS — J029 Acute pharyngitis, unspecified: Secondary | ICD-10-CM | POA: Diagnosis not present

## 2021-03-03 ENCOUNTER — Ambulatory Visit: Payer: Medicaid Other | Admitting: Specialist

## 2021-03-13 DIAGNOSIS — H5213 Myopia, bilateral: Secondary | ICD-10-CM | POA: Diagnosis not present

## 2021-04-06 ENCOUNTER — Encounter: Payer: Self-pay | Admitting: Specialist

## 2021-04-06 ENCOUNTER — Ambulatory Visit (INDEPENDENT_AMBULATORY_CARE_PROVIDER_SITE_OTHER): Payer: Medicaid Other

## 2021-04-06 ENCOUNTER — Ambulatory Visit (INDEPENDENT_AMBULATORY_CARE_PROVIDER_SITE_OTHER): Payer: Medicaid Other | Admitting: Specialist

## 2021-04-06 ENCOUNTER — Other Ambulatory Visit: Payer: Self-pay

## 2021-04-06 VITALS — BP 119/87 | HR 71 | Ht 67.0 in | Wt 222.0 lb

## 2021-04-06 DIAGNOSIS — M47812 Spondylosis without myelopathy or radiculopathy, cervical region: Secondary | ICD-10-CM | POA: Diagnosis not present

## 2021-04-06 DIAGNOSIS — Z981 Arthrodesis status: Secondary | ICD-10-CM

## 2021-04-06 DIAGNOSIS — M542 Cervicalgia: Secondary | ICD-10-CM | POA: Diagnosis not present

## 2021-04-06 MED ORDER — METHOCARBAMOL 500 MG PO TABS: 500.0000 mg | ORAL_TABLET | Freq: Four times a day (QID) | ORAL | 1 refills | Status: DC

## 2021-04-06 MED ORDER — CELECOXIB 200 MG PO CAPS: 200.0000 mg | ORAL_CAPSULE | Freq: Two times a day (BID) | ORAL | 2 refills | Status: DC

## 2021-04-06 NOTE — Patient Instructions (Signed)
Avoid overhead lifting and overhead use of the arms. Do not lift greater than 5 lbs. Adjust head rest in vehicle to prevent hyperextension if rear ended. Celebrex 200 mg po BID with meal or snack. Robaxin or methocarbamol for spasm. Referral to physical therapy

## 2021-04-06 NOTE — Progress Notes (Signed)
Office Visit Note   Patient: Kristin Saunders           Date of Birth: [birth year]           MRN: [id number] Visit Date: 04/06/2021              Requested by: Junie Spencer, FNP [address] [place],  [place] [address] PCP: Junie Spencer, FNP   Assessment & Plan: Visit Diagnoses:  1. S/P lumbar fusion   2. Cervicalgia   3. Spondylosis without myelopathy or radiculopathy, cervical region     Plan: Avoid overhead lifting and overhead use of the arms. Do not lift greater than 5 lbs. Adjust head rest in vehicle to prevent hyperextension if rear ended. Celebrex 200 mg po BID with meal or snack. Robaxin or methocarbamol for spasm. Referral to physical therapy  Follow-Up Instructions: Return in about 4 weeks (around 05/04/2021).   Orders:  Orders Placed This Encounter  Procedures   XR Lumbar Spine 2-3 Views   XR Cervical Spine 2 or 3 views   No orders of the defined types were placed in this encounter.     Procedures: No procedures performed   Clinical Data: No additional findings.   Subjective: Chief Complaint  Patient presents with   Neck - Pain   Lower Back - Follow-up    30 year old female, 7.5 months post op lumbar fusion TLIF L5-S1 for spondylolisthesis. She is still taking intermittant muscle relaxer, otherwise not taking pain meds. Started a walking program about 2.5 months ago and began experiencing pain in the neck and shoulders. Has pain in the neck greater than shoulders and it is worse with overhead use of the arms. She is learning how to drive and hope to take driving test in the near future. No bowel or bladder difficulty. She is walking better and tries to walk for exercise. She is on her feet working at [employer] and the feet are uncomforable.   Review of Systems  Constitutional: Negative.   HENT: Negative.    Eyes: Negative.   Respiratory: Negative.    Cardiovascular: Negative.   Gastrointestinal: Negative.   Endocrine: Negative.    Genitourinary: Negative.   Musculoskeletal: Negative.   Skin: Negative.   Allergic/Immunologic: Negative.   Neurological: Negative.   Hematological: Negative.   Psychiatric/Behavioral: Negative.      Objective: Vital Signs: BP 119/87 (BP Location: Left Arm, Patient Position: Sitting)   Pulse 71   Ht 5\' 7"  (1.702 m)   Wt 222 lb (100.7 kg)   BMI 34.77 kg/m   Physical Exam Constitutional:      Appearance: She is well-developed.  HENT:     Head: Normocephalic and atraumatic.  Eyes:     Pupils: Pupils are equal, round, and reactive to light.  Pulmonary:     Effort: Pulmonary effort is normal.     Breath sounds: Normal breath sounds.  Abdominal:     General: Bowel sounds are normal.     Palpations: Abdomen is soft.  Musculoskeletal:        General: Normal range of motion.     Cervical back: Normal range of motion and neck supple.  Skin:    General: Skin is warm and dry.  Neurological:     Mental Status: She is alert and oriented to person, place, and time.  Psychiatric:        Behavior: Behavior normal.        Thought Content: Thought content normal.  Judgment: Judgment normal.    Ortho Exam  Specialty Comments:  No specialty comments available.  Imaging: XR Cervical Spine 2 or 3 views  Result Date: 04/06/2021 APand lateral flexion and extension radiographs, disc height well maintained with mild degenerative changes of the facets at C7-T1 on lateral radiographs, minimal spondylosis of the UV joints C4-5 and C5-6.   XR Lumbar Spine 2-3 Views  Result Date: 04/06/2021 Ap and lateral lumbar spine demonstrates pedicle screws and rods fixing L5-S1 with cage in good position and alignment. There appears to be bone bridging on the AP view and about 2 mm of motion with flexion and extension suggesting that this is still healing.     PMFS History: Patient Active Problem List   Diagnosis Date Noted   Spondylolisthesis at L5-S1 level 03/11/2019    Priority: High    Spondylolysis 03/11/2019    Priority: High   Chronic bilateral low back pain with bilateral sciatica 03/11/2019    Priority: High   Sore throat 09/27/2020   Status post lumbar spinal fusion 08/24/2020   Obesity (BMI 30.0-34.9) 03/19/2018   Past Medical History:  Diagnosis Date   Anxiety    Depression    GERD (gastroesophageal reflux disease)    Headache    Medical history non-contributory     Family History  Problem Relation Age of Onset   Cancer Father        COLON    Past Surgical History:  Procedure Laterality Date   NO PAST SURGERIES     TUBAL LIGATION     Social History   Occupational History   Not on file  Tobacco Use   Smoking status: Never   Smokeless tobacco: Never  Vaping Use   Vaping Use: Never used  Substance and Sexual Activity   Alcohol use: Yes    Comment: rare   Drug use: No   Sexual activity: Yes    Birth control/protection: None, Surgical

## 2021-04-14 ENCOUNTER — Telehealth: Payer: Medicaid Other | Admitting: Family Medicine

## 2021-04-14 ENCOUNTER — Encounter: Payer: Self-pay | Admitting: Family Medicine

## 2021-04-14 ENCOUNTER — Other Ambulatory Visit (HOSPITAL_COMMUNITY)
Admission: RE | Admit: 2021-04-14 | Discharge: 2021-04-14 | Disposition: A | Discharge status: Home / Self Care | Payer: Medicaid Other | Source: Ambulatory Visit | Attending: Family Medicine | Admitting: Family Medicine

## 2021-04-14 DIAGNOSIS — J069 Acute upper respiratory infection, unspecified: Secondary | ICD-10-CM

## 2021-04-14 DIAGNOSIS — R300 Dysuria: Secondary | ICD-10-CM | POA: Diagnosis not present

## 2021-04-14 DIAGNOSIS — R112 Nausea with vomiting, unspecified: Secondary | ICD-10-CM

## 2021-04-14 DIAGNOSIS — N898 Other specified noninflammatory disorders of vagina: Secondary | ICD-10-CM | POA: Insufficient documentation

## 2021-04-14 DIAGNOSIS — B379 Candidiasis, unspecified: Secondary | ICD-10-CM | POA: Diagnosis not present

## 2021-04-14 LAB — URINALYSIS, COMPLETE
Bilirubin, UA: NEGATIVE
Glucose, UA: NEGATIVE
Ketones, UA: NEGATIVE
Nitrite, UA: NEGATIVE
Protein,UA: NEGATIVE
Specific Gravity, UA: 1.02 (ref 1.005–1.030)
Urobilinogen, Ur: 0.2 mg/dL (ref 0.2–1.0)
pH, UA: 7 (ref 5.0–7.5)

## 2021-04-14 LAB — MICROSCOPIC EXAMINATION: RBC, Urine: NONE SEEN /hpf (ref 0–2)

## 2021-04-14 LAB — PREGNANCY, URINE: Preg Test, Ur: NEGATIVE

## 2021-04-14 MED ORDER — FLUCONAZOLE 150 MG PO TABS: 150.0000 mg | ORAL_TABLET | Freq: Once | ORAL | 0 refills | Status: AC

## 2021-04-14 NOTE — Progress Notes (Signed)
Virtual Visit via MyChart Video Note Due to COVID-19 pandemic this visit was conducted virtually. This visit type was conducted due to national recommendations for restrictions regarding the COVID-19 Pandemic (e.g. social distancing, sheltering in place) in an effort to limit this patient's exposure and mitigate transmission in our community. All issues noted in this document were discussed and addressed.  A physical exam was not performed with this format.   I connected with Kristin Saunders on 04/14/2021 at 1210 by MyChart Video and verified that I am speaking with the correct person using two identifiers. Kristin Saunders is currently located at home and  no one  is currently with them during visit. The provider, Kari Baars, FNP is located in their office at time of visit.  I discussed the limitations, risks, security and privacy concerns of performing an evaluation and management service by telephone and the availability of in person appointments. I also discussed with the patient that there may be a patient responsible charge related to this service. The patient expressed understanding and agreed to proceed.  Subjective:  Patient ID: Kristin Saunders, female    DOB: 01/11/1991, 30 y.o.   MRN: [id number]  Chief Complaint:  Dysuria and Cough   HPI: Kristin Saunders is a 30 y.o. female presenting on 04/14/2021 for Dysuria and Cough   Pt reports cough, congestion, nausea and vomiting which started last night. States she has chills but no fever. No known sick contacts. She states she has vomited once. No diarrhea. She has not tried anything for her symptoms.  She also reports dysuria and vaginal burning with voiding. No hematuria, flank pain, confusion, weakness, or fever. States LMP 5 days ago. She is sexually active but states it has been several weeks since last intercourse. Denies vaginal discharge or bleeding.     Relevant past medical, surgical, family, and social history reviewed and updated  as indicated.  Allergies and medications reviewed and updated.   Past Medical History:  Diagnosis Date   Anxiety    Depression    GERD (gastroesophageal reflux disease)    Headache    Medical history non-contributory     Past Surgical History:  Procedure Laterality Date   NO PAST SURGERIES     TUBAL LIGATION      Social History   Socioeconomic History   Marital status: Single    Spouse name: Not on file   Number of children: Not on file   Years of education: Not on file   Highest education level: Not on file  Occupational History   Not on file  Tobacco Use   Smoking status: Never   Smokeless tobacco: Never  Vaping Use   Vaping Use: Never used  Substance and Sexual Activity   Alcohol use: Yes    Comment: rare   Drug use: No   Sexual activity: Yes    Birth control/protection: None, Surgical  Other Topics Concern   Not on file  Social History Narrative   Not on file   Social Determinants of Health   Financial Resource Strain: Not on file  Food Insecurity: Not on file  Transportation Needs: Not on file  Physical Activity: Not on file  Stress: Not on file  Social Connections: Not on file  Intimate Partner Violence: Not on file    Outpatient Encounter Medications as of 04/14/2021  Medication Sig   celecoxib (CELEBREX) 200 MG capsule Take 1 capsule (200 mg total) by mouth 2 (two) times daily.   docusate sodium (COLACE)  100 MG capsule Take 1 capsule (100 mg total) by mouth 2 (two) times daily.   fexofenadine (ALLEGRA ALLERGY) 180 MG tablet Take 1 tablet (180 mg total) by mouth daily.   fluconazole (DIFLUCAN) 150 MG tablet Take 1 tablet (150 mg total) by mouth every three (3) days as needed.   fluticasone (FLONASE) 50 MCG/ACT nasal spray Place 1 spray into both nostrils daily as needed for allergies or rhinitis.   methocarbamol (ROBAXIN) 500 MG tablet Take 1 tablet (500 mg total) by mouth 4 (four) times daily.   Multiple Vitamin (MULTIVITAMIN WITH MINERALS) TABS  tablet Take 1 tablet by mouth daily.   predniSONE (STERAPRED UNI-PAK 21 TAB) 10 MG (21) TBPK tablet Use as directed   No facility-administered encounter medications on file as of 04/14/2021.    No Known Allergies  Review of Systems  Constitutional:  Positive for appetite change. Negative for activity change, chills, diaphoresis, fatigue, fever and unexpected weight change.  HENT:  Positive for congestion. Negative for drooling, ear discharge, ear pain, facial swelling, hearing loss, mouth sores, nosebleeds, postnasal drip, rhinorrhea, sinus pressure, sinus pain, sneezing, sore throat, tinnitus, trouble swallowing and voice change.   Eyes: Negative.   Respiratory:  Positive for cough. Negative for apnea, choking, chest tightness, shortness of breath, wheezing and stridor.   Cardiovascular:  Negative for chest pain, palpitations and leg swelling.  Gastrointestinal:  Positive for abdominal pain, nausea and vomiting. Negative for abdominal distention, anal bleeding, blood in stool, constipation, diarrhea and rectal pain.  Endocrine: Negative.   Genitourinary:  Positive for dysuria, frequency, urgency and vaginal pain. Negative for decreased urine volume, difficulty urinating, dyspareunia, enuresis, flank pain, genital sores, hematuria, menstrual problem, pelvic pain, vaginal bleeding and vaginal discharge.  Musculoskeletal:  Negative for arthralgias and myalgias.  Skin: Negative.   Allergic/Immunologic: Negative.   Neurological:  Negative for dizziness, tremors, seizures, syncope, facial asymmetry, speech difficulty, weakness, light-headedness, numbness and headaches.  Hematological: Negative.   Psychiatric/Behavioral:  Negative for confusion, hallucinations, sleep disturbance and suicidal ideas.   All other systems reviewed and are negative.       Observations/Objective: No vital signs or physical exam, this was a virtual health encounter.  Pt alert and oriented, answers all questions  appropriately, and able to speak in full sentences. No obvious distress noted. Color normal.    Assessment and Plan: Ellery was seen today for dysuria and cough.  Diagnoses and all orders for this visit:  URI with cough and congestion Nausea and vomiting in adult No reported symptoms concerning for acute bacterial infection. Will test for COVID-19. Still voiding normally, no indications of dehydration, she has only vomited once. Symptomatic care discussed in detail. Further treatment pending lab results.  -     Novel Coronavirus, NAA ([facility])  Dysuria Vaginal irritation Will check urine for possible cystitis, STIs, and pregnancy. Symptomatic care discussed in detail. Treatment pending test results.  -     Urinalysis, Complete -     Pregnancy, urine -     Urine Culture -     GC/Chlamydia probe amp ([place])not at [facility]    Follow Up Instructions: Return if symptoms worsen or fail to improve.    I discussed the assessment and treatment plan with the patient. The patient was provided an opportunity to ask questions and all were answered. The patient agreed with the plan and demonstrated an understanding of the instructions.   The patient was advised to call back or seek an in-person evaluation if the  symptoms worsen or if the condition fails to improve as anticipated.  The above assessment and management plan was discussed with the patient. The patient verbalized understanding of and has agreed to the management plan. Patient is aware to call the clinic if they develop any new symptoms or if symptoms persist or worsen. Patient is aware when to return to the clinic for a follow-up visit. Patient educated on when it is appropriate to go to the emergency department.    I provided 15 minutes of video-face-to-face time during this encounter. The video started at 1210. The video ended at 1225. The other time was used for coordination of care.    Kari Baars, FNP-C [facility] [address] [place], [place] [address] [phone] 04/14/2021

## 2021-04-14 NOTE — Addendum Note (Signed)
Addended by: Sonny Masters on: 04/14/2021 03:39 PM   Modules accepted: Orders

## 2021-04-15 LAB — NOVEL CORONAVIRUS, NAA: SARS-CoV-2, NAA: NOT DETECTED

## 2021-04-15 LAB — SARS-COV-2, NAA 2 DAY TAT

## 2021-04-18 LAB — GC/CHLAMYDIA PROBE AMP (~~LOC~~) NOT AT ARMC
Chlamydia: NEGATIVE
Comment: NEGATIVE
Comment: NORMAL
Neisseria Gonorrhea: NEGATIVE

## 2021-04-26 ENCOUNTER — Ambulatory Visit: Payer: Medicaid Other | Admitting: Physical Therapy

## 2021-04-27 ENCOUNTER — Encounter: Payer: Self-pay | Admitting: Family Medicine

## 2021-05-04 ENCOUNTER — Ambulatory Visit: Payer: Medicaid Other | Attending: Specialist | Admitting: Physical Therapy

## 2021-05-04 ENCOUNTER — Encounter: Payer: Self-pay | Admitting: Physical Therapy

## 2021-05-04 ENCOUNTER — Other Ambulatory Visit: Payer: Self-pay

## 2021-05-04 DIAGNOSIS — M542 Cervicalgia: Secondary | ICD-10-CM | POA: Diagnosis not present

## 2021-05-04 DIAGNOSIS — R293 Abnormal posture: Secondary | ICD-10-CM | POA: Insufficient documentation

## 2021-05-04 NOTE — Therapy (Signed)
[facility] [address] [place], [place], [address] Phone: [phone]   Fax:  [phone]  Physical Therapy Evaluation  Patient Details  Name: Kristin Saunders MRN: [id number] Date of Birth: 09/21/1990 Referring Provider (PT): Vira Browns MD   Encounter Date: 05/04/2021   PT End of Session - 05/04/21 1201     Visit Number 1    Number of Visits 8    Date for PT Re-Evaluation 06/08/21    PT Start Time 1116    PT Stop Time 1138    PT Time Calculation (min) 22 min    Activity Tolerance Patient tolerated treatment well    Behavior During Therapy [facility] for tasks assessed/performed             Past Medical History:  Diagnosis Date   Anxiety    Depression    GERD (gastroesophageal reflux disease)    Headache    Medical history non-contributory     Past Surgical History:  Procedure Laterality Date   NO PAST SURGERIES     TUBAL LIGATION      There were no vitals filed for this visit.    Subjective Assessment - 05/04/21 1207     Subjective COVID-19 screen performed prior to patient entering clinic.  The patient presents to the clinic today with c/o bilateral neck pain that has been ongoing for about two months.  her pain is rated at a 6/10 today but can rise to higher levels with lifting and pulling activities.  Rest decrease her pain.  She reports no injurt or accident.  She also works at [employer] and feel this contributes to her pain as well related to prolonged cooking duties.    Pertinent History L5-S1 fusion.    How long can you sit comfortably? Varies.    Diagnostic tests X-ray.    Patient Stated Goals Get out of pain.    Currently in Pain? Yes    Pain Score 6     Pain Location Neck    Pain Orientation Right;Left    Pain Descriptors / Indicators Throbbing;Aching;Sore    Pain Type Acute pain    Pain Onset More than a month ago    Aggravating Factors  See above.    Pain Relieving Factors See above.                 [facility] PT Assessment - 05/04/21 0001       Assessment   Medical Diagnosis Cervicalgia.    Referring Provider (PT) Vira Browns MD    Onset Date/Surgical Date --   ~2 months.     Precautions   Precautions None      Restrictions   Weight Bearing Restrictions No      Balance Screen   Has the patient fallen in the past 6 months Yes    How many times? 1.    Has the patient had a decrease in activity level because of a fear of falling?  No    Is the patient reluctant to leave their home because of a fear of falling?  No      Home Environment   Living Environment Private residence      Prior Function   Level of Independence Independent      Posture/Postural Control   Posture/Postural Control Postural limitations    Postural Limitations Rounded Shoulders;Forward head      Deep Tendon Reflexes   DTR Assessment Site Biceps;Brachioradialis;Triceps    Biceps DTR --  LT 1+, RT is 2+.   Brachioradialis DTR 2+    Triceps DTR 2+      ROM / Strength   AROM / PROM / Strength AROM;Strength      AROM   Overall AROM Comments Bilateral active cervical rotation is 80 degrees and bilateral SBing is normal.      Strength   Overall Strength Comments Normal bilateral UE strength.      Palpation   Palpation comment Tender to palpation over bilateral cerviical paraspinal musculature and especially over her bilateral UT's which exhibit a great deal of tone.                        Objective measurements completed on examination: See above findings.                     PT Long Term Goals - 05/04/21 1225       PT LONG TERM GOAL #1   Title Patient will be independent with a HEP    Baseline No knowledge of appropriate ther ex.    Time 4    Period Weeks    Status New      PT LONG TERM GOAL #2   Title Perform ADL's with pain not > 2-3/10.    Baseline Pain rises to high levels when performing ADL's.    Time 4    Period Weeks    Status New       PT LONG TERM GOAL #3   Title Perform work duties with pain not > 3/10.    Baseline Pain rises to high levels when performing work duties.    Time 4    Period Weeks    Status New                    Plan - 05/04/21 1220     Clinical Impression Statement The patient presents to OPPT with c/o neck pain that has been ongoing for a couple of months.  Most notable is tenderness over bilateral UT's with a great deal of tone.  Her cervical range of motion is essentially normal and her UE strength is normal.  Her posture is remarkable for a forward head and rounded shoulders. Her left Bicep reflex is decreased when compared to right. Patient will benefit from skilled physical therapy intervention to address pain and deficits.    Personal Factors and Comorbidities Comorbidity 1;Other    Comorbidities L5-S1 fusion.    Examination-Activity Limitations Other;Reach Overhead;Lift   Pulling.   Examination-Participation Restrictions Other;Meal Prep    Stability/Clinical Decision Making Evolving/Moderate complexity    Clinical Decision Making Low    Rehab Potential Excellent    PT Frequency 2x / week    PT Duration 4 weeks    PT Treatment/Interventions Therapeutic activities;Therapeutic exercise;Manual techniques    PT Next Visit Plan STW/M, progression into postural exercise progression.    Consulted and Agree with Plan of Care Patient             Patient will benefit from skilled therapeutic intervention in order to improve the following deficits and impairments:  Pain, Postural dysfunction, Increased muscle spasms  Visit Diagnosis: Cervicalgia - Plan: PT plan of care cert/re-cert  Abnormal posture - Plan: PT plan of care cert/re-cert     Problem List Patient Active Problem List   Diagnosis Date Noted   Sore throat 09/27/2020   Status post lumbar spinal fusion 08/24/2020  Spondylolisthesis at L5-S1 level 03/11/2019   Spondylolysis 03/11/2019   Chronic bilateral low back pain  with bilateral sciatica 03/11/2019   Obesity (BMI 30.0-34.9) 03/19/2018    Kristin Saunders, [place], PT 05/04/2021, 12:29 PM  [facility] [address] [place], [place], [address] Phone: [phone]   Fax:  [phone]  Name: Kristin Saunders MRN: [id number] Date of Birth: 11-11-1990

## 2021-05-11 ENCOUNTER — Ambulatory Visit: Payer: Medicaid Other | Admitting: Specialist

## 2021-05-18 ENCOUNTER — Ambulatory Visit: Payer: Medicaid Other | Admitting: Physical Therapy

## 2021-05-18 ENCOUNTER — Other Ambulatory Visit: Payer: Self-pay

## 2021-05-18 DIAGNOSIS — M542 Cervicalgia: Secondary | ICD-10-CM | POA: Diagnosis not present

## 2021-05-18 DIAGNOSIS — R293 Abnormal posture: Secondary | ICD-10-CM | POA: Diagnosis not present

## 2021-05-18 NOTE — Therapy (Signed)
[facility] [address] [place], [place], [address] Phone: [phone]   Fax:  [phone]  Physical Therapy Treatment  Patient Details  Name: Kristin Saunders MRN: [id number] Date of Birth: Dec 17, 1990 Referring Provider (PT): Vira Browns MD   Encounter Date: 05/18/2021   PT End of Session - 05/18/21 1149     Visit Number 2    Number of Visits 8    Date for PT Re-Evaluation 06/08/21    PT Start Time 0945    PT Stop Time 1032    PT Time Calculation (min) 47 min             Past Medical History:  Diagnosis Date   Anxiety    Depression    GERD (gastroesophageal reflux disease)    Headache    Medical history non-contributory     Past Surgical History:  Procedure Laterality Date   NO PAST SURGERIES     TUBAL LIGATION      There were no vitals filed for this visit.   Subjective Assessment - 05/18/21 1146     Subjective COVID-19 screen performed prior to patient entering clinic.  Pain is a 6 today.    Pertinent History L5-S1 fusion.    How long can you sit comfortably? Varies.    Diagnostic tests X-ray.    Patient Stated Goals Get out of pain.    Currently in Pain? Yes    Pain Score 6     Pain Location Neck    Pain Orientation Right;Left    Pain Descriptors / Indicators Aching;Throbbing;Sore    Pain Type Acute pain    Pain Onset More than a month ago                               [facility] Adult PT Treatment/Exercise - 05/18/21 0001       Exercises   Exercises Shoulder      Shoulder Exercises: ROM/Strengthening   UBE (Upper Arm Bike) 120 RPM's x 8 minutes      Modalities   Modalities Moist Heat      Moist Heat Therapy   Number Minutes Moist Heat 15 Minutes    Moist Heat Location Cervical      Manual Therapy   Manual Therapy Soft tissue mobilization    Soft tissue mobilization STW/M x 15 minutes to patient's bilateral suboccipital, cervical paraspinal musculature and bilateral UT's with  ischemic release technique utilized.                          PT Long Term Goals - 05/04/21 1225       PT LONG TERM GOAL #1   Title Patient will be independent with a HEP    Baseline No knowledge of appropriate ther ex.    Time 4    Period Weeks    Status New      PT LONG TERM GOAL #2   Title Perform ADL's with pain not > 2-3/10.    Baseline Pain rises to high levels when performing ADL's.    Time 4    Period Weeks    Status New      PT LONG TERM GOAL #3   Title Perform work duties with pain not > 3/10.    Baseline Pain rises to high levels when performing work duties.    Time 4    Period Weeks  Status New                   Plan - 05/18/21 1150     Clinical Impression Statement Patient tender to palpation diffusely over her bilateral cervical musculature/UT's with a notable trigger point in her left UT.  She responded well to STW/M with ischemic release technique utilized.    Personal Factors and Comorbidities Comorbidity 1;Other    Comorbidities L5-S1 fusion.    Examination-Activity Limitations Other;Reach Overhead;Lift    Examination-Participation Restrictions Other;Meal Prep    Stability/Clinical Decision Making Evolving/Moderate complexity    Rehab Potential Excellent    PT Frequency 2x / week    PT Duration 4 weeks    PT Treatment/Interventions Therapeutic activities;Therapeutic exercise;Manual techniques    PT Next Visit Plan STW/M, progression into postural exercise progression.    Consulted and Agree with Plan of Care Patient             Patient will benefit from skilled therapeutic intervention in order to improve the following deficits and impairments:  Pain, Postural dysfunction, Increased muscle spasms  Visit Diagnosis: Cervicalgia  Abnormal posture     Problem List Patient Active Problem List   Diagnosis Date Noted   Sore throat 09/27/2020   Status post lumbar spinal fusion 08/24/2020   Spondylolisthesis at  L5-S1 level 03/11/2019   Spondylolysis 03/11/2019   Chronic bilateral low back pain with bilateral sciatica 03/11/2019   Obesity (BMI 30.0-34.9) 03/19/2018    Effie Janoski, [place], PT 05/18/2021, 11:52 AM  [facility] [address] [place], [place], [address] Phone: [phone]   Fax:  [phone]  Name: Kristin Saunders MRN: [id number] Date of Birth: 1990-10-15

## 2021-05-20 ENCOUNTER — Other Ambulatory Visit: Payer: Self-pay

## 2021-05-20 ENCOUNTER — Ambulatory Visit: Payer: Medicaid Other | Admitting: *Deleted

## 2021-05-20 DIAGNOSIS — M542 Cervicalgia: Secondary | ICD-10-CM | POA: Diagnosis not present

## 2021-05-20 DIAGNOSIS — R293 Abnormal posture: Secondary | ICD-10-CM

## 2021-05-20 NOTE — Therapy (Signed)
[facility] [address] [place], [place], [address] Phone: [phone]   Fax:  [phone]  Physical Therapy Treatment  Patient Details  Name: Lakera Viall MRN: [id number] Date of Birth: Aug 20, 1991 Referring Provider (PT): Vira Browns MD   Encounter Date: 05/20/2021   PT End of Session - 05/20/21 0857     Visit Number 3    Number of Visits 8    Date for PT Re-Evaluation 06/08/21    PT Start Time 0900    PT Stop Time 0950    PT Time Calculation (min) 50 min             Past Medical History:  Diagnosis Date   Anxiety    Depression    GERD (gastroesophageal reflux disease)    Headache    Medical history non-contributory     Past Surgical History:  Procedure Laterality Date   NO PAST SURGERIES     TUBAL LIGATION      There were no vitals filed for this visit.   Subjective Assessment - 05/20/21 0852     Subjective COVID-19 screen performed prior to patient entering clinic.  Pain is a 4/10 today    Pertinent History L5-S1 fusion.    How long can you sit comfortably? Varies.    Diagnostic tests X-ray.    Patient Stated Goals Get out of pain.    Currently in Pain? Yes    Pain Score 4     Pain Location Neck    Pain Orientation Right;Left    Pain Descriptors / Indicators Aching                               [facility] Adult PT Treatment/Exercise - 05/20/21 0001       Exercises   Exercises Shoulder;Neck      Neck Exercises: Theraband   Horizontal ABduction 10 reps;20 reps   yellow 3x10     Neck Exercises: Seated   Cervical Isometrics Flexion;Extension;Left lateral flexion;Right lateral flexion;5 secs;5 reps      Shoulder Exercises: ROM/Strengthening   UBE (Upper Arm Bike) 120 RPM's x 8 minutes      Modalities   Modalities Moist Heat      Moist Heat Therapy   Number Minutes Moist Heat 15 Minutes    Moist Heat Location Cervical      Manual Therapy   Manual Therapy Soft tissue  mobilization    Soft tissue mobilization STW/M x 15 minutes to patient's bilateral suboccipital, cervical paraspinal musculature and bilateral UT's with ischemic release technique utilized.                          PT Long Term Goals - 05/04/21 1225       PT LONG TERM GOAL #1   Title Patient will be independent with a HEP    Baseline No knowledge of appropriate ther ex.    Time 4    Period Weeks    Status New      PT LONG TERM GOAL #2   Title Perform ADL's with pain not > 2-3/10.    Baseline Pain rises to high levels when performing ADL's.    Time 4    Period Weeks    Status New      PT LONG TERM GOAL #3   Title Perform work duties with pain not > 3/10.    Baseline Pain  rises to high levels when performing work duties.    Time 4    Period Weeks    Status New                   Plan - 05/20/21 1007     Clinical Impression Statement Pt arrived today doing fairly well with 4/10 pain. Rx focused on isometrics and postural strengthening with H-abd yellow tband. Handouts given for HEP. TPR to Bil. UTs with good release.. Normal response to heat/Estim.    Personal Factors and Comorbidities Comorbidity 1;Other    Comorbidities L5-S1 fusion.    Examination-Activity Limitations Other;Reach Overhead;Lift    Examination-Participation Restrictions Other;Meal Prep    Stability/Clinical Decision Making Evolving/Moderate complexity    Rehab Potential Excellent    PT Frequency 2x / week    PT Duration 4 weeks    PT Treatment/Interventions Therapeutic activities;Therapeutic exercise;Manual techniques    PT Next Visit Plan STW/M, progression into postural exercise progression.    Consulted and Agree with Plan of Care Patient             Patient will benefit from skilled therapeutic intervention in order to improve the following deficits and impairments:  Pain, Postural dysfunction, Increased muscle spasms  Visit Diagnosis: Cervicalgia  Abnormal  posture     Problem List Patient Active Problem List   Diagnosis Date Noted   Sore throat 09/27/2020   Status post lumbar spinal fusion 08/24/2020   Spondylolisthesis at L5-S1 level 03/11/2019   Spondylolysis 03/11/2019   Chronic bilateral low back pain with bilateral sciatica 03/11/2019   Obesity (BMI 30.0-34.9) 03/19/2018    Unika Nazareno,CHRIS, PTA 05/20/2021, 10:14 AM  [facility] [address] [place], [place], [address] Phone: [phone]   Fax:  [phone]  Name: Amandalynn Pitz MRN: [id number] Date of Birth: [birth year]

## 2021-05-24 ENCOUNTER — Ambulatory Visit: Payer: Medicaid Other

## 2021-06-01 ENCOUNTER — Ambulatory Visit: Payer: Medicaid Other

## 2021-06-02 ENCOUNTER — Encounter: Payer: Self-pay | Admitting: Physical Therapy

## 2021-06-02 ENCOUNTER — Ambulatory Visit: Payer: Medicaid Other | Attending: Specialist | Admitting: Physical Therapy

## 2021-06-02 ENCOUNTER — Other Ambulatory Visit: Payer: Self-pay

## 2021-06-02 DIAGNOSIS — G8929 Other chronic pain: Secondary | ICD-10-CM | POA: Insufficient documentation

## 2021-06-02 DIAGNOSIS — M6281 Muscle weakness (generalized): Secondary | ICD-10-CM | POA: Diagnosis not present

## 2021-06-02 DIAGNOSIS — R293 Abnormal posture: Secondary | ICD-10-CM | POA: Insufficient documentation

## 2021-06-02 DIAGNOSIS — M5442 Lumbago with sciatica, left side: Secondary | ICD-10-CM | POA: Insufficient documentation

## 2021-06-02 DIAGNOSIS — M5441 Lumbago with sciatica, right side: Secondary | ICD-10-CM | POA: Diagnosis not present

## 2021-06-02 DIAGNOSIS — M542 Cervicalgia: Secondary | ICD-10-CM | POA: Insufficient documentation

## 2021-06-02 NOTE — Therapy (Signed)
[facility] [address] [place], [place], [address] Phone: [phone]   Fax:  [phone]  Physical Therapy Treatment  Patient Details  Name: Kristin Saunders MRN: [id number] Date of Birth: [birth year] Referring Provider (PT): Vira Browns MD   Encounter Date: 06/02/2021   PT End of Session - 06/02/21 0949     Visit Number 4    Number of Visits 8    Date for PT Re-Evaluation 06/08/21    PT Start Time 0946    PT Stop Time 1034   heat x 10 min at end   PT Time Calculation (min) 48 min    Activity Tolerance Patient tolerated treatment well    Behavior During Therapy [facility] for tasks assessed/performed             Past Medical History:  Diagnosis Date   Anxiety    Depression    GERD (gastroesophageal reflux disease)    Headache    Medical history non-contributory     Past Surgical History:  Procedure Laterality Date   NO PAST SURGERIES     TUBAL LIGATION      There were no vitals filed for this visit.   Subjective Assessment - 06/02/21 0949     Subjective COVID-19 screen performed prior to patient entering clinic.  Pain is a 6/10 today    Pertinent History L5-S1 fusion.    Patient Stated Goals Get out of pain.    Currently in Pain? Yes    Pain Score 6     Pain Location Neck    Pain Orientation Right;Left    Pain Descriptors / Indicators Aching    Pain Type Acute pain                               [facility] Adult PT Treatment/Exercise - 06/02/21 0001       Neck Exercises: Theraband   Horizontal ABduction 20 reps;Red   2x10   Other Theraband Exercises ER with scap retraction red x 20    Other Theraband Exercises diagonals red x 5 ea      Neck Exercises: Standing   Neck Retraction 5 reps;5 secs    Neck Retraction Limitations cues to avoid UT activation      Shoulder Exercises: ROM/Strengthening   UBE (Upper Arm Bike) 120 RPM's x 8 minutes alternating direction      Modalities   Modalities  Moist Heat      Moist Heat Therapy   Number Minutes Moist Heat 15 Minutes    Moist Heat Location Cervical      Manual Therapy   Manual Therapy Soft tissue mobilization;Joint mobilization;Myofascial release    Joint Mobilization PA mobs to C7/T1    Soft tissue mobilization to bil UT, cervical paraspinals and subocciptitals    Myofascial Release OA release and TPR to bil UT                          PT Long Term Goals - 05/04/21 1225       PT LONG TERM GOAL #1   Title Patient will be independent with a HEP    Baseline No knowledge of appropriate ther ex.    Time 4    Period Weeks    Status New      PT LONG TERM GOAL #2   Title Perform ADL's with pain not > 2-3/10.    Baseline  Pain rises to high levels when performing ADL's.    Time 4    Period Weeks    Status New      PT LONG TERM GOAL #3   Title Perform work duties with pain not > 3/10.    Baseline Pain rises to high levels when performing work duties.    Time 4    Period Weeks    Status New                   Plan - 06/02/21 1027     Clinical Impression Statement Pt tolerated increased therex today well. She could "feel it" in her upper traps with horizontal ABD, but no increased pain at end. She has pain with PA mobs at CT junction and would benefit from more extension at this level. Educated pt on importance of posture and maintaining neutral cervical spine and retracted shoulders.    PT Treatment/Interventions Therapeutic activities;Therapeutic exercise;Manual techniques    PT Next Visit Plan STW/M, progression into postural exercise progression.    Consulted and Agree with Plan of Care Patient             Patient will benefit from skilled therapeutic intervention in order to improve the following deficits and impairments:  Pain, Postural dysfunction, Increased muscle spasms  Visit Diagnosis: Cervicalgia  Chronic bilateral low back pain with bilateral sciatica  Abnormal  posture  Muscle weakness (generalized)     Problem List Patient Active Problem List   Diagnosis Date Noted   Sore throat 09/27/2020   Status post lumbar spinal fusion 08/24/2020   Spondylolisthesis at L5-S1 level 03/11/2019   Spondylolysis 03/11/2019   Chronic bilateral low back pain with bilateral sciatica 03/11/2019   Obesity (BMI 30.0-34.9) 03/19/2018   Solon Palm PT 06/02/2021, 12:43 PM  [facility] [address] [place], [place], [address] Phone: [phone]   Fax:  [phone]  Name: Kristin Saunders MRN: [id number] Date of Birth: 1991-02-15

## 2021-06-08 ENCOUNTER — Ambulatory Visit: Payer: Medicaid Other

## 2021-06-08 ENCOUNTER — Other Ambulatory Visit: Payer: Self-pay

## 2021-06-08 DIAGNOSIS — R293 Abnormal posture: Secondary | ICD-10-CM

## 2021-06-08 DIAGNOSIS — G8929 Other chronic pain: Secondary | ICD-10-CM

## 2021-06-08 DIAGNOSIS — M542 Cervicalgia: Secondary | ICD-10-CM

## 2021-06-08 DIAGNOSIS — M5442 Lumbago with sciatica, left side: Secondary | ICD-10-CM | POA: Diagnosis not present

## 2021-06-08 DIAGNOSIS — M6281 Muscle weakness (generalized): Secondary | ICD-10-CM | POA: Diagnosis not present

## 2021-06-08 DIAGNOSIS — M5441 Lumbago with sciatica, right side: Secondary | ICD-10-CM | POA: Diagnosis not present

## 2021-06-08 NOTE — Therapy (Signed)
[facility] [address] [place], [place], [address] Phone: [phone]   Fax:  [phone]  Physical Therapy Treatment  Patient Details  Name: Kristin Saunders MRN: [id number] Date of Birth: Mar 19, 1991 Referring Provider (PT): Vira Browns MD   Encounter Date: 06/08/2021   PT End of Session - 06/08/21 1017     Visit Number 5    Number of Visits 8    Date for PT Re-Evaluation 06/08/21    PT Start Time 1030    PT Stop Time 1122    PT Time Calculation (min) 52 min    Activity Tolerance Patient tolerated treatment well    Behavior During Therapy [facility] for tasks assessed/performed             Past Medical History:  Diagnosis Date   Anxiety    Depression    GERD (gastroesophageal reflux disease)    Headache    Medical history non-contributory     Past Surgical History:  Procedure Laterality Date   NO PAST SURGERIES     TUBAL LIGATION      There were no vitals filed for this visit.   Subjective Assessment - 06/08/21 1017     Subjective COVID-19 screen performed prior to patient entering clinic.  Pt reports 6/10 neck pain upon arriving for treatment.  Hasn't really been able to tell a difference in pain since starting therapy.    Pertinent History L5-S1 fusion.    Patient Stated Goals Get out of pain.    Currently in Pain? Yes    Pain Score 6     Pain Location Neck    Pain Orientation Right;Left    Pain Descriptors / Indicators Aching;Throbbing    Pain Type Acute pain    Pain Frequency Intermittent                               [facility] Adult PT Treatment/Exercise - 06/08/21 0001       Exercises   Exercises Shoulder;Neck      Neck Exercises: Machines for Strengthening   UBE (Upper Arm Bike) 8 mins, 120 rpm   4 mins foward/4 mins backward     Neck Exercises: Theraband   Scapula Retraction 20 reps   no theraband, with 3 sec holdshoul   Shoulder Extension Red;20 reps   Cues for technique   Rows  Red;20 reps   cues for technique   Shoulder External Rotation Red;20 reps   Cues to keep elbows at side   Horizontal ADduction Red;20 reps   Cues to technique and eccentric control     Neck Exercises: Seated   Cervical Rotation 20 reps   with 3 sec hold   Shoulder Shrugs 20 reps      Neck Exercises: Stretches   Upper Trapezius Stretch Right;Left;3 reps;30 seconds   cues for posture     Modalities   Modalities Moist Heat      Moist Heat Therapy   Number Minutes Moist Heat 10 Minutes    Moist Heat Location Cervical   at end of treatment     Manual Therapy   Manual Therapy Soft tissue mobilization    Manual therapy comments 18    Soft tissue mobilization to bil UT, cervical paraspinals and subocciptitals                          PT Long Term Goals -  05/04/21 1225       PT LONG TERM GOAL #1   Title Patient will be independent with a HEP    Baseline No knowledge of appropriate ther ex.    Time 4    Period Weeks    Status New      PT LONG TERM GOAL #2   Title Perform ADL's with pain not > 2-3/10.    Baseline Pain rises to high levels when performing ADL's.    Time 4    Period Weeks    Status New      PT LONG TERM GOAL #3   Title Perform work duties with pain not > 3/10.    Baseline Pain rises to high levels when performing work duties.    Time 4    Period Weeks    Status New                   Plan - 06/08/21 1017     Clinical Impression Statement Pt tolerated the addition of multiple theraband exercises today well, requiring cues to proper technique and posture.  Pt states that she could "feel it working" during exericses, but did not report any increased pain.  Reiterated imporatance of proper posture while sitting and standing at work.  Pt with tenderness to BUT during manual therapy, but reports decrease in pain and tightness.    Personal Factors and Comorbidities Comorbidity 1;Other    Comorbidities L5-S1 fusion.    Examination-Activity  Limitations Other;Reach Overhead;Lift    Examination-Participation Restrictions Other;Meal Prep    PT Treatment/Interventions Therapeutic activities;Therapeutic exercise;Manual techniques    PT Next Visit Plan STW/M, progression into postural exercise progression.    Consulted and Agree with Plan of Care Patient             Patient will benefit from skilled therapeutic intervention in order to improve the following deficits and impairments:  Pain, Postural dysfunction, Increased muscle spasms  Visit Diagnosis: Cervicalgia  Abnormal posture  Chronic bilateral low back pain with bilateral sciatica     Problem List Patient Active Problem List   Diagnosis Date Noted   Sore throat 09/27/2020   Status post lumbar spinal fusion 08/24/2020   Spondylolisthesis at L5-S1 level 03/11/2019   Spondylolysis 03/11/2019   Chronic bilateral low back pain with bilateral sciatica 03/11/2019   Obesity (BMI 30.0-34.9) 03/19/2018    Newman Pies, PTA 06/08/2021, 11:26 AM  [facility] [address] [place], [place], [address] Phone: [phone]   Fax:  [phone]  Name: Kristin Saunders MRN: [id number] Date of Birth: 1990/12/18

## 2021-06-09 ENCOUNTER — Ambulatory Visit: Payer: Medicaid Other | Admitting: *Deleted

## 2021-06-10 ENCOUNTER — Encounter: Payer: Self-pay | Admitting: Specialist

## 2021-06-10 ENCOUNTER — Ambulatory Visit: Payer: Self-pay

## 2021-06-10 ENCOUNTER — Ambulatory Visit (INDEPENDENT_AMBULATORY_CARE_PROVIDER_SITE_OTHER): Payer: Medicaid Other | Admitting: Specialist

## 2021-06-10 ENCOUNTER — Other Ambulatory Visit: Payer: Self-pay

## 2021-06-10 VITALS — BP 107/71 | HR 76 | Ht 66.0 in | Wt 217.0 lb

## 2021-06-10 DIAGNOSIS — M48062 Spinal stenosis, lumbar region with neurogenic claudication: Secondary | ICD-10-CM

## 2021-06-10 DIAGNOSIS — Z981 Arthrodesis status: Secondary | ICD-10-CM | POA: Diagnosis not present

## 2021-06-10 DIAGNOSIS — M4317 Spondylolisthesis, lumbosacral region: Secondary | ICD-10-CM

## 2021-06-10 MED ORDER — GABAPENTIN 100 MG PO CAPS: 100.0000 mg | ORAL_CAPSULE | Freq: Every day | ORAL | 3 refills | Status: DC

## 2021-06-10 NOTE — Progress Notes (Signed)
   Office Visit Note   Patient: Kristin Saunders           Date of Birth: 07/15/1991           MRN: [id number] Visit Date: 06/10/2021              Requested by: Junie Spencer, FNP [address] [place],  [place] [address] PCP: Junie Spencer, FNP   Assessment & Plan: Visit Diagnoses:  1. S/P lumbar fusion   2. Spondylolisthesis at L5-S1 level   3. Spinal stenosis of lumbar region with neurogenic claudication     Plan: Avoid overhead lifting and overhead use of the arms. Do not lift greater than 5 lbs. Adjust head rest in vehicle to prevent hyperextension if rear ended. Take extra precautions to avoid falling. Avoid frequent bending and stooping  No lifting greater than 10 lbs. May use ice or moist heat for pain. Weight loss is of benefit. Best medication for lumbar disc disease is arthritis medications like motrin, celebrex and naprosyn. Exercise is important to improve your indurance and does allow people to function better inspite of back pain.      Follow-Up Instructions: No follow-ups on file.   Orders:  Orders Placed This Encounter  Procedures   XR Lumbar Spine 2-3 Views   No orders of the defined types were placed in this encounter.     Procedures: No procedures performed   Clinical Data: No additional findings.   Subjective: Chief Complaint  Patient presents with   Neck - Pain, Follow-up   Lower Back - Pain, Follow-up    HPI  Review of Systems   Objective: Vital Signs: BP 107/71 (BP Location: Left Arm, Patient Position: Sitting, Cuff Size: Large)   Pulse 76   Ht 5\' 6"  (1.676 m)   Wt 217 lb (98.4 kg)   BMI 35.02 kg/m   Physical Exam  Ortho Exam  Specialty Comments:  No specialty comments available.  Imaging: No results found.   PMFS History: Patient Active Problem List   Diagnosis Date Noted   Spondylolisthesis at L5-S1 level 03/11/2019    Priority: 1.   Spondylolysis 03/11/2019    Priority: 1.   Chronic bilateral low  back pain with bilateral sciatica 03/11/2019    Priority: 1.   Sore throat 09/27/2020   Status post lumbar spinal fusion 08/24/2020   Obesity (BMI 30.0-34.9) 03/19/2018   Past Medical History:  Diagnosis Date   Anxiety    Depression    GERD (gastroesophageal reflux disease)    Headache    Medical history non-contributory     Family History  Problem Relation Age of Onset   Cancer Father        COLON    Past Surgical History:  Procedure Laterality Date   NO PAST SURGERIES     TUBAL LIGATION     Social History   Occupational History   Not on file  Tobacco Use   Smoking status: Never   Smokeless tobacco: Never  Vaping Use   Vaping Use: Never used  Substance and Sexual Activity   Alcohol use: Yes    Comment: rare   Drug use: No   Sexual activity: Yes    Birth control/protection: None, Surgical

## 2021-06-10 NOTE — Patient Instructions (Signed)
Plan: Avoid overhead lifting and overhead use of the arms. Do not lift greater than 5 lbs. Adjust head rest in vehicle to prevent hyperextension if rear ended. Take extra precautions to avoid falling. Avoid frequent bending and stooping  No lifting greater than 10 lbs. May use ice or moist heat for pain. Weight loss is of benefit. Best medication for lumbar disc disease is arthritis medications like motrin, celebrex and naprosyn. Exercise is important to improve your indurance and does allow people to function better inspite of back pain. Continue with PT and if PT is not able to be of benefit then an MRI of the cervical spine will be  Requested.

## 2021-06-15 ENCOUNTER — Ambulatory Visit: Payer: Medicaid Other | Admitting: Physical Therapy

## 2021-06-16 ENCOUNTER — Encounter: Payer: Self-pay | Admitting: Physical Therapy

## 2021-06-16 ENCOUNTER — Ambulatory Visit: Payer: Medicaid Other | Admitting: Physical Therapy

## 2021-06-16 ENCOUNTER — Other Ambulatory Visit: Payer: Self-pay

## 2021-06-16 DIAGNOSIS — R293 Abnormal posture: Secondary | ICD-10-CM | POA: Diagnosis not present

## 2021-06-16 DIAGNOSIS — M5441 Lumbago with sciatica, right side: Secondary | ICD-10-CM | POA: Diagnosis not present

## 2021-06-16 DIAGNOSIS — M6281 Muscle weakness (generalized): Secondary | ICD-10-CM | POA: Diagnosis not present

## 2021-06-16 DIAGNOSIS — M542 Cervicalgia: Secondary | ICD-10-CM | POA: Diagnosis not present

## 2021-06-16 DIAGNOSIS — M5442 Lumbago with sciatica, left side: Secondary | ICD-10-CM | POA: Diagnosis not present

## 2021-06-16 DIAGNOSIS — G8929 Other chronic pain: Secondary | ICD-10-CM | POA: Diagnosis not present

## 2021-06-16 NOTE — Therapy (Signed)
[facility] [address] [place], [place], [address] Phone: [phone]   Fax:  [phone]  Physical Therapy Treatment  Patient Details  Name: Emillie Chasen MRN: [id number] Date of Birth: 03-Jan-1991 Referring Provider (PT): Vira Browns MD   Encounter Date: 06/16/2021   PT End of Session - 06/16/21 0949     Visit Number 6    Number of Visits 8    Date for PT Re-Evaluation 06/08/21    PT Start Time 0945    PT Stop Time 1028    PT Time Calculation (min) 43 min    Activity Tolerance Patient tolerated treatment well    Behavior During Therapy [facility] for tasks assessed/performed             Past Medical History:  Diagnosis Date   Anxiety    Depression    GERD (gastroesophageal reflux disease)    Headache    Medical history non-contributory     Past Surgical History:  Procedure Laterality Date   NO PAST SURGERIES     TUBAL LIGATION      There were no vitals filed for this visit.   Subjective Assessment - 06/16/21 0948     Subjective COVID-19 screen performed prior to patient entering clinic. Reports 5/10 neck and shoulder pain.    Pertinent History L5-S1 fusion.    How long can you sit comfortably? Varies.    Diagnostic tests X-ray.    Patient Stated Goals Get out of pain.    Currently in Pain? Yes    Pain Score 5     Pain Location Neck    Pain Orientation Lower    Pain Descriptors / Indicators Discomfort    Pain Type Acute pain    Pain Onset More than a month ago    Pain Frequency Intermittent                [facility] PT Assessment - 06/16/21 0001       Assessment   Medical Diagnosis Cervicalgia.    Referring Provider (PT) Vira Browns MD      Precautions   Precautions None      Restrictions   Weight Bearing Restrictions No                           [facility] Adult PT Treatment/Exercise - 06/16/21 0001       Neck Exercises: Machines for Strengthening   UBE (Upper Arm Bike) 8 mins, 90  rpm      Neck Exercises: Theraband   Scapula Retraction 20 reps;Limitations    Scapula Retraction Limitations yellow    Shoulder Extension 20 reps;Limitations    Shoulder Extension Limitations yellow    Rows 20 reps;Limitations    Rows Limitations yellow    Shoulder External Rotation 20 reps;Limitations    Shoulder External Rotation Limitations yellow    Horizontal ABduction 20 reps;Limitations    Horizontal ABduction Limitations yellow      Neck Exercises: Standing   Wall Push Ups 20 reps    Upper Extremity D2 Flexion;15 reps      Neck Exercises: Seated   Cervical Isometrics Extension;5 secs;15 reps    X to V 15 reps      Neck Exercises: Stretches   Upper Trapezius Stretch Right;Left;3 reps;30 seconds    Levator Stretch Right;Left;2 reps;30 seconds    Corner Stretch 3 reps;20 seconds    Lower Cervical/Upper Thoracic Stretch 3 reps;30 seconds  Manual Therapy   Manual Therapy Soft tissue mobilization    Soft tissue mobilization STW to B thoracic and cervical paraspinals, scapular retractors, UT                          PT Long Term Goals - 05/04/21 1225       PT LONG TERM GOAL #1   Title Patient will be independent with a HEP    Baseline No knowledge of appropriate ther ex.    Time 4    Period Weeks    Status New      PT LONG TERM GOAL #2   Title Perform ADL's with pain not > 2-3/10.    Baseline Pain rises to high levels when performing ADL's.    Time 4    Period Weeks    Status New      PT LONG TERM GOAL #3   Title Perform work duties with pain not > 3/10.    Baseline Pain rises to high levels when performing work duties.    Time 4    Period Weeks    Status New                   Plan - 06/16/21 1145     Clinical Impression Statement Patient presented in clinic with reports of continued cervicothoracic pain especially during activity. Patient guided through multiple postural exercises with VCs for improved posture. Patient  fatigued quickly in UEs with postural exercises. Partially compliant with HEP as she states that she is tired usually after working. Min-mod muscle tightness of postural musculature.    Personal Factors and Comorbidities Comorbidity 1;Other    Comorbidities L5-S1 fusion.    Examination-Activity Limitations Other;Reach Overhead;Lift    Examination-Participation Restrictions Other;Meal Prep    Stability/Clinical Decision Making Evolving/Moderate complexity    Rehab Potential Excellent    PT Frequency 2x / week    PT Duration 4 weeks    PT Treatment/Interventions Therapeutic activities;Therapeutic exercise;Manual techniques    PT Next Visit Plan STW/M, progression into postural exercise progression.    Consulted and Agree with Plan of Care Patient             Patient will benefit from skilled therapeutic intervention in order to improve the following deficits and impairments:  Pain, Postural dysfunction, Increased muscle spasms  Visit Diagnosis: Cervicalgia  Abnormal posture     Problem List Patient Active Problem List   Diagnosis Date Noted   Sore throat 09/27/2020   Status post lumbar spinal fusion 08/24/2020   Spondylolisthesis at L5-S1 level 03/11/2019   Spondylolysis 03/11/2019   Chronic bilateral low back pain with bilateral sciatica 03/11/2019   Obesity (BMI 30.0-34.9) 03/19/2018    Marvell Fuller, PTA 06/16/2021, 11:49 AM  [facility] [address] [place], [place], [address] Phone: [phone]   Fax:  [phone]  Name: Prentiss Hammett MRN: [id number] Date of Birth: April 09, 1991

## 2021-06-21 ENCOUNTER — Ambulatory Visit: Payer: Medicaid Other | Attending: Specialist | Admitting: Physical Therapy

## 2021-06-21 DIAGNOSIS — R293 Abnormal posture: Secondary | ICD-10-CM | POA: Insufficient documentation

## 2021-06-21 DIAGNOSIS — M542 Cervicalgia: Secondary | ICD-10-CM | POA: Insufficient documentation

## 2021-06-28 ENCOUNTER — Ambulatory Visit: Payer: Medicaid Other

## 2021-06-29 ENCOUNTER — Other Ambulatory Visit: Payer: Self-pay

## 2021-06-29 ENCOUNTER — Ambulatory Visit: Payer: Medicaid Other

## 2021-06-29 DIAGNOSIS — M542 Cervicalgia: Secondary | ICD-10-CM

## 2021-06-29 DIAGNOSIS — R293 Abnormal posture: Secondary | ICD-10-CM | POA: Diagnosis not present

## 2021-06-29 NOTE — Therapy (Signed)
[facility] [address] [place], [place], [address] Phone: [phone]   Fax:  [phone]  Physical Therapy Treatment  Patient Details  Name: Kristin Saunders MRN: [id number] Date of Birth: 12/06/1990 Referring Provider (PT): Vira Browns MD   Encounter Date: 06/29/2021   PT End of Session - 06/29/21 1122     Visit Number 7    Number of Visits 8    Date for PT Re-Evaluation 06/08/21    PT Start Time 1115    PT Stop Time 1158    PT Time Calculation (min) 43 min    Activity Tolerance Patient tolerated treatment well    Behavior During Therapy [facility] for tasks assessed/performed             Past Medical History:  Diagnosis Date   Anxiety    Depression    GERD (gastroesophageal reflux disease)    Headache    Medical history non-contributory     Past Surgical History:  Procedure Laterality Date   NO PAST SURGERIES     TUBAL LIGATION      There were no vitals filed for this visit.   Subjective Assessment - 06/29/21 1117     Subjective COVID-19 screen performed prior to patient entering clinic. Patient reports that her pain is a 6/10 today.    Pertinent History L5-S1 fusion.    How long can you sit comfortably? Varies.    Diagnostic tests X-ray.    Patient Stated Goals Get out of pain.    Currently in Pain? Yes    Pain Score 6     Pain Location Neck    Pain Orientation Lower    Pain Type Acute pain    Pain Onset More than a month ago    Pain Frequency Intermittent                               [facility] Adult PT Treatment/Exercise - 06/29/21 0001       Neck Exercises: Machines for Strengthening   UBE (Upper Arm Bike) 10 minutes; 120 rpm      Neck Exercises: Theraband   Shoulder Extension 20 reps;Red    Rows 20 reps;Red    Shoulder External Rotation 20 reps;Red    Shoulder Internal Rotation 20 reps;Green      Neck Exercises: Standing   Wall Push Ups 20 reps    Other Standing Exercises Open  books   20 reps each; cues to cervical, thoracic, and lumbar rotation     Neck Exercises: Stretches   Upper Trapezius Stretch Left;Right;4 reps;30 seconds      Manual Therapy   Manual Therapy Joint mobilization    Joint Mobilization PA mobs to C7/T1                          PT Long Term Goals - 06/29/21 1143       PT LONG TERM GOAL #1   Title Patient will be independent with a HEP    Baseline unable to complete due to reports of lack of time prior to work    Time 4    Period Weeks    Status On-going      PT LONG TERM GOAL #2   Title Perform ADL's with pain not > 2-3/10.    Baseline Pain rises to high levels when performing ADL's.    Time 4  Period Weeks    Status On-going      PT LONG TERM GOAL #3   Title Perform work duties with pain not > 3/10.    Baseline Pain rises to high levels when performing work duties.    Time 4    Period Weeks    Status On-going                   Plan - 06/29/21 1122     Clinical Impression Statement Patient presented to treatment with no significant change in her symptoms sincer her las appointment. She was introduced to resisted internal rotation for improved shoulder strength. She required minimal cuing with today's interventions for proper exercise pacing to facilitate neuromuscular control and avoid exacerbating her familiar pain. Manual therapy focused on CPA's to C7-T1 which was able to reduce her pain. She reported feeling better upon the conclusion of treatment. She may benefit from a follow from her referring physician due to her continued pain and no significant change in her symptoms.    Personal Factors and Comorbidities Comorbidity 1;Other    Comorbidities L5-S1 fusion.    Examination-Activity Limitations Other;Reach Overhead;Lift    Examination-Participation Restrictions Other;Meal Prep    Stability/Clinical Decision Making Evolving/Moderate complexity    Rehab Potential Excellent    PT Frequency 2x /  week    PT Duration 4 weeks    PT Treatment/Interventions Therapeutic activities;Therapeutic exercise;Manual techniques    PT Next Visit Plan STW/M, progression into postural exercise progression.    Consulted and Agree with Plan of Care Patient             Patient will benefit from skilled therapeutic intervention in order to improve the following deficits and impairments:  Pain, Postural dysfunction, Increased muscle spasms  Visit Diagnosis: Cervicalgia  Abnormal posture     Problem List Patient Active Problem List   Diagnosis Date Noted   Sore throat 09/27/2020   Status post lumbar spinal fusion 08/24/2020   Spondylolisthesis at L5-S1 level 03/11/2019   Spondylolysis 03/11/2019   Chronic bilateral low back pain with bilateral sciatica 03/11/2019   Obesity (BMI 30.0-34.9) 03/19/2018    Granville Lewis, PT 06/29/2021, 12:54 PM  [facility] [address] [place], [place], [address] Phone: [phone]   Fax:  [phone]  Name: Kristin Saunders MRN: [id number] Date of Birth: 29-Apr-1991

## 2021-07-08 ENCOUNTER — Ambulatory Visit: Payer: Medicaid Other | Admitting: *Deleted

## 2021-07-12 ENCOUNTER — Ambulatory Visit: Payer: Medicaid Other | Admitting: Physical Therapy

## 2021-07-12 ENCOUNTER — Other Ambulatory Visit: Payer: Self-pay

## 2021-07-12 ENCOUNTER — Encounter: Payer: Self-pay | Admitting: Physical Therapy

## 2021-07-12 DIAGNOSIS — R293 Abnormal posture: Secondary | ICD-10-CM | POA: Diagnosis not present

## 2021-07-12 DIAGNOSIS — M542 Cervicalgia: Secondary | ICD-10-CM

## 2021-07-12 NOTE — Therapy (Signed)
[facility] [place], [place], [address] Phone: [phone]   Fax:  [phone]  Physical Therapy Treatment  Patient Details  Name: Kristin Saunders MRN: [id number] Date of Birth: 11/08/1990 Referring Provider (PT): Basil Dess MD   Encounter Date: 07/12/2021   PT End of Session - 07/12/21 1118     Visit Number 8    Number of Visits 8    Date for PT Re-Evaluation 06/08/21    PT Start Time 1118    PT Stop Time 1157    PT Time Calculation (min) 39 min    Activity Tolerance Patient tolerated treatment well    Behavior During Therapy [facility] for tasks assessed/performed             Past Medical History:  Diagnosis Date   Anxiety    Depression    GERD (gastroesophageal reflux disease)    Headache    Medical history non-contributory     Past Surgical History:  Procedure Laterality Date   NO PAST SURGERIES     TUBAL LIGATION      There were no vitals filed for this visit.   Subjective Assessment - 07/12/21 1113     Subjective COVID-19 screen performed prior to patient entering clinic. Still having pain.    Pertinent History L5-S1 fusion.    How long can you sit comfortably? Varies.    Diagnostic tests X-ray.    Patient Stated Goals Get out of pain.    Currently in Pain? Yes    Pain Score 5     Pain Location Neck    Pain Orientation Posterior    Pain Descriptors / Indicators Discomfort    Pain Type Acute pain    Pain Onset More than a month ago    Pain Frequency Intermittent                [facility] PT Assessment - 07/12/21 0001       Assessment   Medical Diagnosis Cervicalgia.    Referring Provider (PT) Basil Dess MD      Precautions   Precautions None                           [facility] Adult PT Treatment/Exercise - 07/12/21 0001       Neck Exercises: Machines for Strengthening   UBE (Upper Arm Bike) 120 RPM x6 min   focus on proper seated posture   Nustep L4 x16 min       Neck Exercises: Theraband   Shoulder Extension 20 reps;Limitations    Shoulder Extension Limitations Blue XTS    Rows 20 reps;Limitations    Rows Limitations Blue XTS    Horizontal ABduction 20 reps;Red      Neck Exercises: Standing   Neck Retraction 10 reps;5 secs   with scapular retraction   Wall Push Ups 20 reps    Upper Extremity D2 Flexion;15 reps      Shoulder Exercises: Standing   Flexion AROM;Both;15 reps    Flexion Limitations towel for cervical isometric    ABduction AROM;Both;10 reps    ABduction Limitations towel for cervical isometric    Other Standing Exercises B shoulder scaption x15 reps; towel for cervical isometric                          PT Long Term Goals - 07/12/21 1137       PT LONG TERM  GOAL #1   Title Patient will be independent with a HEP    Baseline unable to complete due to reports of lack of time prior to work    Time 4    Period Weeks    Status On-going      PT LONG TERM GOAL #2   Title Perform ADL's with pain not > 2-3/10.    Baseline Pain rises to high levels when performing ADL's.    Time 4    Period Weeks    Status Not Met   Depending on amount of activity but with increased activity 7/10     PT LONG TERM GOAL #3   Title Perform work duties with pain not > 3/10.    Baseline Pain rises to high levels when performing work duties.    Time 4    Period Weeks    Status Not Met   10/10                  Plan - 07/12/21 1204     Clinical Impression Statement Patient presented in clinic with reports of continued cervical pain. Patient progressed through postural reeducation and strengthening. Patient's UE fatigue notable throughout therex. Patient reports that she still has numbness and tingling sensation in UEs. Pain is mostly while at work as she looks down fairly consistantly during her shifts. Patient encouraged to contact MD regarding lack of progress and continued pain.    Personal Factors and Comorbidities  Comorbidity 1;Other    Comorbidities L5-S1 fusion.    Examination-Activity Limitations Other;Reach Overhead;Lift    Examination-Participation Restrictions Other;Meal Prep    Stability/Clinical Decision Making Evolving/Moderate complexity    Rehab Potential Excellent    PT Frequency 2x / week    PT Duration 4 weeks    PT Treatment/Interventions Therapeutic activities;Therapeutic exercise;Manual techniques    PT Next Visit Plan STW/M, progression into postural exercise progression.    Consulted and Agree with Plan of Care Patient             Patient will benefit from skilled therapeutic intervention in order to improve the following deficits and impairments:  Pain, Postural dysfunction, Increased muscle spasms  Visit Diagnosis: Cervicalgia  Abnormal posture     Problem List Patient Active Problem List   Diagnosis Date Noted   Sore throat 09/27/2020   Status post lumbar spinal fusion 08/24/2020   Spondylolisthesis at L5-S1 level 03/11/2019   Spondylolysis 03/11/2019   Chronic bilateral low back pain with bilateral sciatica 03/11/2019   Obesity (BMI 30.0-34.9) 03/19/2018    Kelsey P Kennon, PTA 07/12/2021, 12:24 PM  [facility] [address] [place], [place], [address] Phone: [phone]   Fax:  [phone]  Name: Kristin Saunders MRN: [id number] Date of Birth: 06/05/1991    

## 2021-07-13 ENCOUNTER — Ambulatory Visit: Payer: Medicaid Other | Admitting: Specialist

## 2021-07-26 ENCOUNTER — Encounter: Payer: Self-pay | Admitting: Family

## 2021-07-26 ENCOUNTER — Telehealth (INDEPENDENT_AMBULATORY_CARE_PROVIDER_SITE_OTHER): Payer: Medicaid Other | Admitting: Family

## 2021-07-26 DIAGNOSIS — R6889 Other general symptoms and signs: Secondary | ICD-10-CM | POA: Diagnosis not present

## 2021-07-26 LAB — VERITOR FLU A/B WAIVED
Influenza A: NEGATIVE
Influenza B: NEGATIVE

## 2021-07-26 MED ORDER — OSELTAMIVIR PHOSPHATE 75 MG PO CAPS: 75.0000 mg | ORAL_CAPSULE | Freq: Two times a day (BID) | ORAL | 0 refills | Status: DC

## 2021-07-26 NOTE — Addendum Note (Signed)
Addended by: [name] A on: 07/26/2021 02:15 PM   Modules accepted: Orders

## 2021-07-26 NOTE — Progress Notes (Signed)
Virtual Visit  Note Due to COVID-19 pandemic this visit was conducted virtually. This visit type was conducted due to national recommendations for restrictions regarding the COVID-19 Pandemic (e.g. social distancing, sheltering in place) in an effort to limit this patient's exposure and mitigate transmission in our community. All issues noted in this document were discussed and addressed.  A physical exam was not performed with this format.  I connected with Kristin Saunders on 07/26/21 at 12:48 pm by telephone and verified that I am speaking with the correct person using two identifiers. Kristin Saunders is currently located at home and children is currently with her during visit. The provider, [name], FNP is located in their office at time of visit.  I discussed the limitations, risks, security and privacy concerns of performing an evaluation and management service by telephone and the availability of in person appointments. I also discussed with the patient that there may be a patient responsible charge related to this service. The patient expressed understanding and agreed to proceed.  [name], ehler are scheduled for a virtual visit with your provider today.    Just as we do with appointments in the office, we must obtain your consent to participate.  Your consent will be active for this visit and any virtual visit you may have with one of our providers in the next 365 days.    If you have a MyChart account, I can also send a copy of this consent to you electronically.  All virtual visits are billed to your insurance company just like a traditional visit in the office.  As this is a virtual visit, video technology does not allow for your provider to perform a traditional examination.  This may limit your provider's ability to fully assess your condition.  If your provider identifies any concerns that need to be evaluated in person or the need to arrange testing such as labs, EKG, etc, we will  make arrangements to do so.    Although advances in technology are sophisticated, we cannot ensure that it will always work on either your end or our end.  If the connection with a video visit is poor, we may have to switch to a telephone visit.  With either a video or telephone visit, we are not always able to ensure that we have a secure connection.   I need to obtain your verbal consent now.   Are you willing to proceed with your visit today?   Kristin Saunders has provided verbal consent on 07/26/2021 for a virtual visit (video or telephone).   [name], [place] 07/26/2021  12:59 PM    History and Present Illness:  PT calls the office today with cough that started yesterday.  Cough This is a new problem. The current episode started yesterday. The problem has been waxing and waning. The problem occurs every few minutes. The cough is Productive of sputum. Associated symptoms include headaches. Pertinent negatives include no chills, ear congestion, ear pain, fever, myalgias, nasal congestion, postnasal drip or shortness of breath. She has tried rest for the symptoms. The treatment provided mild relief.     Review of Systems  Constitutional:  Negative for chills and fever.  HENT:  Negative for ear pain and postnasal drip.   Respiratory:  Positive for cough. Negative for shortness of breath.   Musculoskeletal:  Negative for myalgias.  Neurological:  Positive for headaches.    Observations/Objective: No SOB or distress noted   Assessment and Plan: 1. Flu-like symptoms  Rest Force fluids Pt will come and get flu and COVID tested Tylenol as needed Work note given  Follow up if symptoms worsen or do not improve  - Veritor Flu A/B Waived - Novel Coronavirus, NAA ([facility])   I discussed the assessment and treatment plan with the patient. The patient was provided an opportunity to ask questions and all were answered. The patient agreed with the plan and demonstrated an understanding of  the instructions.   The patient was advised to call back or seek an in-person evaluation if the symptoms worsen or if the condition fails to improve as anticipated.  The above assessment and management plan was discussed with the patient. The patient verbalized understanding of and has agreed to the management plan. Patient is aware to call the clinic if symptoms persist or worsen. Patient is aware when to return to the clinic for a follow-up visit. Patient educated on when it is appropriate to go to the emergency department.   Time call ended:  12:59 pm   I provided 11 minutes of  non face-to-face time during this encounter.    [name], FNP

## 2021-07-27 LAB — NOVEL CORONAVIRUS, NAA: SARS-CoV-2, NAA: NOT DETECTED

## 2021-08-03 ENCOUNTER — Encounter: Payer: Self-pay | Admitting: Specialist

## 2021-08-03 ENCOUNTER — Other Ambulatory Visit: Payer: Self-pay

## 2021-08-03 ENCOUNTER — Ambulatory Visit (INDEPENDENT_AMBULATORY_CARE_PROVIDER_SITE_OTHER): Payer: Medicaid Other | Admitting: Specialist

## 2021-08-03 VITALS — BP 106/73 | HR 75 | Ht 66.0 in | Wt 217.0 lb

## 2021-08-03 DIAGNOSIS — R202 Paresthesia of skin: Secondary | ICD-10-CM | POA: Diagnosis not present

## 2021-08-03 DIAGNOSIS — M542 Cervicalgia: Secondary | ICD-10-CM | POA: Diagnosis not present

## 2021-08-03 DIAGNOSIS — M47812 Spondylosis without myelopathy or radiculopathy, cervical region: Secondary | ICD-10-CM

## 2021-08-03 DIAGNOSIS — R200 Anesthesia of skin: Secondary | ICD-10-CM

## 2021-08-03 NOTE — Progress Notes (Signed)
Office Visit Note   Patient: Meghanne Pletz           Date of Birth: Jan 31, 1991           MRN: [id number] Visit Date: 08/03/2021              Requested by: Junie Spencer, FNP [address] [place],  [place] [address] PCP: Junie Spencer, FNP   Assessment & Plan: Visit Diagnoses:  1. Spondylosis without myelopathy or radiculopathy, cervical region   2. Cervicalgia     Plan: Avoid overhead lifting and overhead use of the arms. Do not lift greater than 10-15 lbs. Adjust head rest in vehicle to prevent hyperextension if rear ended. Take extra precautions to avoid falling. Order MRI to assess for cervical stenosis in the mid cervical segment. Follow-Up Instructions: No follow-ups on file.   Orders:  No orders of the defined types were placed in this encounter.  No orders of the defined types were placed in this encounter.     Procedures: No procedures performed   Clinical Data: No additional findings.   Subjective: Chief Complaint  Patient presents with   Neck - Follow-up    Doing PT and it is not helping her   Lower Back - Follow-up    Back is doing fine    30 year old female with history of lumbosacral spondylolisthesis, post fusion in 08/24/2020, she has had cervical spine posterior neck pain and occipital pain. There is occasional popping associated with ROM of the cervical spine. She reports constipation, she is walking fine and low back pain is much better and the low back does not hurt. Plain radiographs show minimal disc narrowing C4-5 and C5-6, the space available for the spinal cord is narrowed with torg ratio less than 0.6 at the C4-5 and C5-6 levels. There is mild uncovertebral spondylosis present. She complains of  Tingling into the arms, to the dorsal forearms but not hands. She feels weak in the arms. Has completed PT for about 4 weeks and is doing home exercises but unfortunately she does not feel any better  Review of  Systems   Objective: Vital Signs: BP 106/73 (BP Location: Left Arm, Patient Position: Sitting)    Pulse 75    Ht 5\' 6"  (1.676 m)    Wt 217 lb (98.4 kg)    BMI 35.02 kg/m   Physical Exam  Ortho Exam  Specialty Comments:  No specialty comments available.  Imaging: No results found.   PMFS History: Patient Active Problem List   Diagnosis Date Noted   Spondylolisthesis at L5-S1 level 03/11/2019    Priority: High   Spondylolysis 03/11/2019    Priority: High   Chronic bilateral low back pain with bilateral sciatica 03/11/2019    Priority: High   Sore throat 09/27/2020   Status post lumbar spinal fusion 08/24/2020   Obesity (BMI 30.0-34.9) 03/19/2018   Past Medical History:  Diagnosis Date   Anxiety    Depression    GERD (gastroesophageal reflux disease)    Headache    Medical history non-contributory     Family History  Problem Relation Age of Onset   Cancer Father        COLON    Past Surgical History:  Procedure Laterality Date   NO PAST SURGERIES     TUBAL LIGATION     Social History   Occupational History   Not on file  Tobacco Use   Smoking status: Never   Smokeless  tobacco: Never  Vaping Use   Vaping Use: Never used  Substance and Sexual Activity   Alcohol use: Yes    Comment: rare   Drug use: No   Sexual activity: Yes    Birth control/protection: None, Surgical

## 2021-08-03 NOTE — Patient Instructions (Signed)
Plan: Avoid overhead lifting and overhead use of the arms. Do not lift greater than 10-15 lbs. Adjust head rest in vehicle to prevent hyperextension if rear ended. Hot showers at the beginning of the day and ice when the pain is severe. Take extra precautions to avoid falling. Order MRI to assess for cervical stenosis in the mid cervical segments C4-5 and C5-6.

## 2021-09-06 ENCOUNTER — Other Ambulatory Visit: Payer: Self-pay | Admitting: Radiology

## 2021-09-06 ENCOUNTER — Telehealth: Payer: Self-pay | Admitting: Specialist

## 2021-09-06 DIAGNOSIS — R200 Anesthesia of skin: Secondary | ICD-10-CM

## 2021-09-06 DIAGNOSIS — M542 Cervicalgia: Secondary | ICD-10-CM

## 2021-09-06 DIAGNOSIS — M47812 Spondylosis without myelopathy or radiculopathy, cervical region: Secondary | ICD-10-CM

## 2021-09-06 NOTE — Telephone Encounter (Signed)
Pt called stating she was supposed to have a CT scan done but never received the number for the facility it was going to be at. Pt would like a CB to make sure she's still supposed to have the CT and confirm the number of the facility if so.   [phone]

## 2021-09-06 NOTE — Telephone Encounter (Signed)
Order placed for MRI

## 2021-09-07 NOTE — Telephone Encounter (Signed)
I tried to call her back, she needs to call [facility] @ [phone] schedule for an MRI if she calls back

## 2021-09-19 ENCOUNTER — Encounter: Payer: Self-pay | Admitting: *Deleted

## 2021-09-26 DIAGNOSIS — J029 Acute pharyngitis, unspecified: Secondary | ICD-10-CM | POA: Diagnosis not present

## 2021-09-26 DIAGNOSIS — N3001 Acute cystitis with hematuria: Secondary | ICD-10-CM | POA: Diagnosis not present

## 2021-09-26 DIAGNOSIS — J329 Chronic sinusitis, unspecified: Secondary | ICD-10-CM | POA: Diagnosis not present

## 2021-09-26 DIAGNOSIS — R300 Dysuria: Secondary | ICD-10-CM | POA: Diagnosis not present

## 2021-10-12 ENCOUNTER — Encounter: Payer: Self-pay | Admitting: Family Medicine

## 2021-10-13 ENCOUNTER — Ambulatory Visit: Payer: Medicaid Other | Admitting: Family

## 2021-10-14 ENCOUNTER — Encounter: Payer: Self-pay | Admitting: Family

## 2021-10-17 ENCOUNTER — Ambulatory Visit: Payer: Medicaid Other | Admitting: Nurse Practitioner

## 2021-10-17 ENCOUNTER — Encounter: Payer: Self-pay | Admitting: Nurse Practitioner

## 2021-10-17 VITALS — BP 106/71 | HR 99 | Temp 97.9°F | Ht 66.0 in | Wt 223.0 lb

## 2021-10-17 DIAGNOSIS — M542 Cervicalgia: Secondary | ICD-10-CM

## 2021-10-17 MED ORDER — METHYLPREDNISOLONE ACETATE 40 MG/ML IJ SUSP: 80.0000 mg | Freq: Once | INTRAMUSCULAR | Status: DC

## 2021-10-17 MED ORDER — METHOCARBAMOL 500 MG PO TABS: 500.0000 mg | ORAL_TABLET | Freq: Four times a day (QID) | ORAL | 1 refills | Status: DC

## 2021-10-17 NOTE — Progress Notes (Addendum)
Acute Office Visit  Subjective:    Patient ID: Kristin Saunders, female    DOB: February 04, 1991, 31 y.o.   MRN: [id number]  Chief Complaint  Patient presents with   Neck Pain    X1 year Radiates down both arms to the hands Numbness, tingling, weakness    Neck Pain  Patient is in today for Pain  She reports recurrent neck /cervical pain pain. was not an injury that may have caused the pain. The pain started a few months ago and is staying constant. The pain does not radiate . The pain is described as aching, is 8/10 in intensity, occurring constantly. Symptoms are worse in the: morning, mid-day, afternoon, evening  Aggravating factors: bending backwards, bending forwards, and bending sideways Relieving factors: medication Robaxen 500 mg tablet by mouth as needed  She has tried application of heat and NSAIDs with little relief.   [facility] Office Visit from 10/17/2021 in [place]  PHQ-2 Total Score 3       GAD 7 : Generalized Anxiety Score 10/17/2021 03/23/2020  Nervous, Anxious, on Edge 2 2  Control/stop worrying 2 3  Worry too much - different things 3 3  Trouble relaxing 3 2  Restless 3 1  Easily annoyed or irritable 2 2  Afraid - awful might happen 0 3  Total GAD 7 Score 15 16  Anxiety Difficulty Somewhat difficult Somewhat difficult      Past Medical History:  Diagnosis Date   Anxiety    Depression    GERD (gastroesophageal reflux disease)    Headache    Medical history non-contributory     Past Surgical History:  Procedure Laterality Date   NO PAST SURGERIES     TUBAL LIGATION      Family History  Problem Relation Age of Onset   Cancer Father        COLON    Social History   Socioeconomic History   Marital status: Single    Spouse name: Not on file   Number of children: Not on file   Years of education: Not on file   Highest education level: Not on file  Occupational History   Not on file  Tobacco Use   Smoking status:  Never   Smokeless tobacco: Never  Vaping Use   Vaping Use: Never used  Substance and Sexual Activity   Alcohol use: Yes    Comment: rare   Drug use: No   Sexual activity: Yes    Birth control/protection: None, Surgical  Other Topics Concern   Not on file  Social History Narrative   Not on file   Social Determinants of Health   Financial Resource Strain: Not on file  Food Insecurity: Not on file  Transportation Needs: Not on file  Physical Activity: Not on file  Stress: Not on file  Social Connections: Not on file  Intimate Partner Violence: Not on file    Outpatient Medications Prior to Visit  Medication Sig Dispense Refill   celecoxib (CELEBREX) 200 MG capsule Take 1 capsule (200 mg total) by mouth 2 (two) times daily. 60 capsule 2   fexofenadine (ALLEGRA ALLERGY) 180 MG tablet Take 1 tablet (180 mg total) by mouth daily. 90 tablet 1   fluticasone (FLONASE) 50 MCG/ACT nasal spray Place 1 spray into both nostrils daily as needed for allergies or rhinitis.     gabapentin (NEURONTIN) 100 MG capsule Take 1 capsule (100 mg total) by mouth at bedtime. 30 capsule 3  Multiple Vitamin (MULTIVITAMIN WITH MINERALS) TABS tablet Take 1 tablet by mouth daily.     methocarbamol (ROBAXIN) 500 MG tablet Take 1 tablet (500 mg total) by mouth 4 (four) times daily. 30 tablet 1   docusate sodium (COLACE) 100 MG capsule Take 1 capsule (100 mg total) by mouth 2 (two) times daily. 10 capsule 0   oseltamivir (TAMIFLU) 75 MG capsule Take 1 capsule (75 mg total) by mouth 2 (two) times daily. 10 capsule 0   predniSONE (STERAPRED UNI-PAK 21 TAB) 10 MG (21) TBPK tablet Use as directed 21 tablet 0   No facility-administered medications prior to visit.    No Known Allergies  Review of Systems  Constitutional: Negative.   HENT: Negative.    Respiratory: Negative.    Cardiovascular: Negative.   Gastrointestinal: Negative.   Musculoskeletal:  Positive for neck pain.  Skin:  Negative for rash.  All  other systems reviewed and are negative.     Objective:    Physical Exam Vitals and nursing note reviewed.  Constitutional:      Appearance: Normal appearance.  HENT:     Head: Normocephalic.     Right Ear: External ear normal.     Left Ear: External ear normal.     Nose: Nose normal.     Mouth/Throat:     Mouth: Mucous membranes are moist.     Pharynx: Oropharynx is clear.  Eyes:     Conjunctiva/sclera: Conjunctivae normal.  Cardiovascular:     Rate and Rhythm: Normal rate and regular rhythm.     Pulses: Normal pulses.     Heart sounds: Normal heart sounds.  Pulmonary:     Effort: Pulmonary effort is normal.     Breath sounds: Normal breath sounds.  Abdominal:     General: Bowel sounds are normal.  Musculoskeletal:     Cervical back: No rigidity. Pain with movement present. Decreased range of motion.  Skin:    General: Skin is warm.     Findings: No rash.  Neurological:     Mental Status: She is alert and oriented to person, place, and time.    BP 106/71    Pulse 99    Temp 97.9 F (36.6 C)    Ht 5\' 6"  (1.676 m)    Wt 223 lb (101.2 kg)    LMP 10/10/2021 (Approximate)    SpO2 98%    BMI 35.99 kg/m  Wt Readings from Last 3 Encounters:  10/17/21 223 lb (101.2 kg)  08/03/21 217 lb (98.4 kg)  06/10/21 217 lb (98.4 kg)    Health Maintenance Due  Topic Date Due   COVID-19 Vaccine (1) Never done   HIV Screening  Never done   Hepatitis C Screening  Never done   PAP-Cervical Cytology Screening  Never done   PAP SMEAR-Modifier  Never done   INFLUENZA VACCINE  03/21/2021    There are no preventive care reminders to display for this patient.   No results found for: TSH Lab Results  Component Value Date   WBC 14.6 (H) 08/25/2020   HGB 11.6 (L) 08/25/2020   HCT 35.8 (L) 08/25/2020   MCV 97.0 08/25/2020   PLT 229 08/25/2020   Lab Results  Component Value Date   NA 139 08/25/2020   K 3.9 08/25/2020   CO2 25 08/25/2020   GLUCOSE 104 (H) 08/25/2020   BUN 10  08/25/2020   CREATININE 0.85 08/25/2020   BILITOT 0.6 07/30/2019   ALKPHOS 90 07/30/2019  AST 18 07/30/2019   ALT 16 07/30/2019   PROT 7.5 07/30/2019   ALBUMIN 4.1 07/30/2019   CALCIUM 8.5 (L) 08/25/2020   ANIONGAP 7 08/25/2020       Assessment & Plan:  Neck pain not well resolved, pain is worse with daily activity especially at work due to constant lifting. Patient reports that Robaxin has been therapeutic but has no medication left. Completed reassessment on neck pain. Patient has mild to moderate pain with decreased range of motion. Patient has an upcoming MRI appointment. Advised patient to continue medication as prescribed, heat pad and resting neck from strainous work. 80 DepoMedrol shot given in clinic  Problem List Items Addressed This Visit   None Visit Diagnoses     Neck pain    -  Primary   Relevant Medications   methocarbamol (ROBAXIN) 500 MG tablet   methylPREDNISolone acetate (DEPO-MEDROL) injection 80 mg        Meds ordered this encounter  Medications   methocarbamol (ROBAXIN) 500 MG tablet    Sig: Take 1 tablet (500 mg total) by mouth 4 (four) times daily.    Dispense:  30 tablet    Refill:  1    Order Specific Question:   Supervising Provider    Answer:   Claretta Fraise [id number]   methylPREDNISolone acetate (DEPO-MEDROL) injection 80 mg     Ivy Lynn, NP

## 2021-10-17 NOTE — Patient Instructions (Signed)
Cervical Radiculopathy Cervical radiculopathy happens when a nerve in the neck (a cervical nerve) is pinched or bruised. This condition can happen because of an injury to the cervical spine (vertebrae) in the neck, or as part of the normal aging process. Pressure on the cervical nerves can cause pain or numbness that travels from the neck all the way down to the arm and fingers. This condition usually gets better with rest. Treatment may be needed if the condition does not improve. What are the causes? This condition may be caused by: A neck injury. A bulging (herniated) disk. Muscle spasms. Muscle tightness in the neck due to overuse. Arthritis. Breakdown or degeneration in the bones and joints of the spine (spondylosis) due to aging. Bone spurs that may develop near the cervical nerves. What are the signs or symptoms? Symptoms of this condition include: Pain. The pain may travel from the neck to the arm and hand. The pain can be severe or irritating. It may get worse when you move your neck. Numbness or tingling in your arm or hand. Weakness in the affected arm and hand, in severe cases. How is this diagnosed? This condition may be diagnosed based on your symptoms, your medical history, and a physical exam. You may also have tests, including: X-rays. CT scan. MRI. Electromyogram (EMG). Nerve conduction tests. How is this treated? In many cases, treatment is not needed for this condition. With rest, the condition usually gets better over time. If treatment is needed, options may include: Wearing a soft neck collar (cervical collar) for short periods of time. Doing physical therapy to strengthen your neck muscles. Taking medicines. These may include NSAIDs, such as ibuprofen, or oral corticosteroids. Having spinal injections, in severe cases. Having surgery. This may be needed if other treatments do not help. Different types of surgery may be done depending on the cause of this  condition. Follow these instructions at home: If you have a cervical collar: Wear it as told by your health care provider. Remove it only as told by your health care provider. Ask your health care provider if you can remove the cervical collar for cleaning and bathing. If you are allowed to remove the collar for cleaning or bathing: Follow instructions from your health care provider about how to remove the collar safely. Clean the collar by wiping it with mild soap and water and drying it completely. Take out any removable pads in the collar every 1-2 days, and wash them by hand with soap and water. Let them air-dry completely before you put them back in the collar. Check your skin under the collar for irritation or sores. If you see any, tell your health care provider. Managing pain   Take over-the-counter and prescription medicines only as told by your health care provider. If directed, put ice on the affected area. To do this: If you have a soft neck collar, remove it as told by your health care provider. Put ice in a plastic bag. Place a towel between your skin and the bag. Leave the ice on for 20 minutes, 2-3 times a day. Remove the ice if your skin turns bright red. This is very important. If you cannot feel pain, heat, or cold, you have a greater risk of damage to the area. If applying ice does not help, you can try using heat. Use the heat source that your health care provider recommends, such as a moist heat pack or a heating pad. Place a towel between your skin and   the heat source. Leave the heat on for 20-30 minutes. Remove the heat if your skin turns bright red. This is especially important if you are unable to feel pain, heat, or cold. You have a greater risk of getting burned. Try a gentle neck and shoulder massage to help relieve symptoms. Activity Rest as needed. Return to your normal activities as told by your health care provider. Ask your health care provider what  activities are safe for you. Do stretching and strengthening exercises as told by your health care provider or your physical therapist. You may have to avoid lifting. Ask your health care provider how much you can safely lift. General instructions Use a flat pillow when you sleep. Do not drive while wearing a cervical collar. If you do not have a cervical collar, ask your health care provider if it is safe to drive while your neck heals. Ask your health care provider if the medicine prescribed to you requires you to avoid driving or using machinery. Do not use any products that contain nicotine or tobacco. These products include cigarettes, chewing tobacco, and vaping devices, such as e-cigarettes. If you need help quitting, ask your health care provider. Keep all follow-up visits. This is important. Contact a health care provider if: Your condition does not improve with treatment. Get help right away if: Your pain gets much worse and is not controlled with medicines. You have weakness or numbness in your hand, arm, face, or leg. You have a high fever. You have a stiff, rigid neck. You lose control of your bowels or your bladder (have incontinence). You have trouble with walking, balance, or speaking. Summary Cervical radiculopathy happens when a nerve in the neck is pinched or bruised. A nerve can get pinched from a bulging disk, arthritis, muscle spasms, or an injury to the neck. Symptoms include pain, tingling, or numbness radiating from the neck to the arm or hand. Weakness can also occur in severe cases. Treatment may include rest, wearing a cervical collar, and physical therapy. Medicines may be prescribed to help with pain. In severe cases, injections or surgery may be needed. This information is not intended to replace advice given to you by your health care provider. Make sure you discuss any questions you have with your health care provider. Document Revised: 02/10/2021 Document  Reviewed: 02/10/2021 Elsevier Patient Education  2022 [employer]  

## 2021-10-26 ENCOUNTER — Encounter: Payer: Self-pay | Admitting: Family Medicine

## 2021-10-26 ENCOUNTER — Ambulatory Visit (INDEPENDENT_AMBULATORY_CARE_PROVIDER_SITE_OTHER): Payer: Medicaid Other | Admitting: Family Medicine

## 2021-10-26 DIAGNOSIS — J020 Streptococcal pharyngitis: Secondary | ICD-10-CM

## 2021-10-26 LAB — RAPID STREP SCREEN (MED CTR MEBANE ONLY): Strep Gp A Ag, IA W/Reflex: POSITIVE — AB

## 2021-10-26 MED ORDER — AMOXICILLIN-POT CLAVULANATE 875-125 MG PO TABS: 1.0000 | ORAL_TABLET | Freq: Two times a day (BID) | ORAL | 0 refills | Status: DC

## 2021-10-26 NOTE — Progress Notes (Signed)
? ? ?Subjective:  ? ? Patient ID: Kristin Saunders, female    DOB: 22-Oct-1990, 31 y.o.   MRN: [id number] ? ? ?HPI: ?Kristin Saunders is a 31 y.o. female presenting for sore throat.  Her daughter was diagnosed with strep throat yesterday.  Her other daughter has a sore throat today and is being seen to be tested as well.  She needs to be tested for strep.  No fever chills or sweats.  No other respiratory symptoms noted. ? ? ?Depression screen [facility] 2/9 10/17/2021 03/23/2020 03/11/2019 02/17/2019 09/17/2018  ?Decreased Interest 3 3 0 0 0  ?Down, Depressed, Hopeless 0 3 0 0 0  ?PHQ - 2 Score 3 6 0 0 0  ?Altered sleeping 3 3 - - -  ?Tired, decreased energy 3 3 - - -  ?Change in appetite - 0 - - -  ?Feeling bad or failure about yourself  0 0 - - -  ?Trouble concentrating 0 2 - - -  ?Moving slowly or fidgety/restless 3 1 - - -  ?Suicidal thoughts 0 0 - - -  ?PHQ-9 Score 12 15 - - -  ?Difficult doing work/chores Very difficult - - - -  ? ? ? ?Relevant past medical, surgical, family and social history reviewed and updated as indicated.  ?Interim medical history since our last visit reviewed. ?Allergies and medications reviewed and updated. ? ?ROS:  ?Review of Systems  ?Constitutional:  Positive for appetite change. Negative for activity change and fever.  ?HENT:  Positive for sore throat.    ? ?Social History  ? ?Tobacco Use  ?Smoking Status Never  ?Smokeless Tobacco Never  ? ? ?   ?Objective:  ?  ? ?Wt Readings from Last 3 Encounters:  ?10/17/21 223 lb (101.2 kg)  ?08/03/21 217 lb (98.4 kg)  ?06/10/21 217 lb (98.4 kg)  ?  ? ?Exam deferred. Pt. Harboring due to COVID 19. Phone visit performed.  ?Results for orders placed or performed in visit on 10/26/21  ?Rapid Strep Screen ([facility] ONLY)  ? Specimen: Other  ? Other  ?Result Value Ref Range  ? Strep Gp A Ag, IA W/Reflex Positive (A) Negative  ? ? ?Assessment & Plan:  ? ?1. Strep pharyngitis   ? ? ?No orders of the defined types were placed in this encounter. ? ? ?Orders Placed  This Encounter  ?Procedures  ? Rapid Strep Screen ([facility] ONLY)  ? ?  ? ?Diagnoses and all orders for this visit: ? ?Strep pharyngitis ?-     Rapid Strep Screen ([facility] ONLY) ? ? ? ?Virtual Visit via telephone Note ? ?I discussed the limitations, risks, security and privacy concerns of performing an evaluation and management service by telephone and the availability of in person appointments. The patient was identified with two identifiers. Pt.expressed understanding and agreed to proceed. Pt. Is at home. Dr. Darlyn Read is in his office. ? ?Follow Up Instructions: ?  ?I discussed the assessment and treatment plan with the patient. The patient was provided an opportunity to ask questions and all were answered. The patient agreed with the plan and demonstrated an understanding of the instructions. ?  ?The patient was advised to call back or seek an in-person evaluation if the symptoms worsen or if the condition fails to improve as anticipated. ? ? ?Total minutes including chart review and phone contact time: 6 ? ? ?Follow up plan: ?Return if symptoms worsen or fail to improve. ? ?[name], MD ?[facility] ? ?

## 2021-10-27 ENCOUNTER — Telehealth: Payer: Self-pay | Admitting: Family

## 2021-10-27 NOTE — Telephone Encounter (Signed)
Patient aware and verbalized understanding. °

## 2021-10-27 NOTE — Telephone Encounter (Signed)
YEs, I tried to call last night several times. She has strep. I sent in an antibiotic scrip for her ?

## 2021-10-27 NOTE — Telephone Encounter (Signed)
Looks + please advise  ?

## 2021-11-04 ENCOUNTER — Ambulatory Visit: Payer: Medicaid Other | Admitting: Family

## 2021-11-04 DIAGNOSIS — R070 Pain in throat: Secondary | ICD-10-CM | POA: Diagnosis not present

## 2021-11-04 DIAGNOSIS — J069 Acute upper respiratory infection, unspecified: Secondary | ICD-10-CM | POA: Diagnosis not present

## 2021-11-07 DIAGNOSIS — K529 Noninfective gastroenteritis and colitis, unspecified: Secondary | ICD-10-CM | POA: Diagnosis not present

## 2021-11-07 DIAGNOSIS — R1110 Vomiting, unspecified: Secondary | ICD-10-CM | POA: Diagnosis not present

## 2021-11-07 DIAGNOSIS — E860 Dehydration: Secondary | ICD-10-CM | POA: Diagnosis not present

## 2021-11-10 ENCOUNTER — Ambulatory Visit: Payer: Medicaid Other | Admitting: Surgery

## 2021-11-17 ENCOUNTER — Ambulatory Visit: Payer: Medicaid Other | Admitting: Surgery

## 2021-11-23 ENCOUNTER — Ambulatory Visit (INDEPENDENT_AMBULATORY_CARE_PROVIDER_SITE_OTHER): Payer: Medicaid Other | Admitting: Family

## 2021-11-23 ENCOUNTER — Encounter: Payer: Self-pay | Admitting: Family

## 2021-11-23 VITALS — BP 106/76 | HR 84 | Temp 97.4°F | Ht 66.0 in | Wt 220.0 lb

## 2021-11-23 DIAGNOSIS — Z20818 Contact with and (suspected) exposure to other bacterial communicable diseases: Secondary | ICD-10-CM

## 2021-11-23 DIAGNOSIS — J029 Acute pharyngitis, unspecified: Secondary | ICD-10-CM | POA: Diagnosis not present

## 2021-11-23 LAB — CULTURE, GROUP A STREP

## 2021-11-23 LAB — RAPID STREP SCREEN (MED CTR MEBANE ONLY): Strep Gp A Ag, IA W/Reflex: NEGATIVE

## 2021-11-23 MED ORDER — AMOXICILLIN 500 MG PO CAPS: 500.0000 mg | ORAL_CAPSULE | Freq: Two times a day (BID) | ORAL | 0 refills | Status: AC

## 2021-11-23 NOTE — Progress Notes (Signed)
? ?Subjective:  ? ? Patient ID: Kristin Saunders, female    DOB: 06/08/1991, 31 y.o.   MRN: [id number] ? ?Chief Complaint  ?Patient presents with  ? Sore Throat  ?  Both daughters + for strep   ? ?Pt presents to the office today with sore throat that started yesterday. Both daughters positive for strep.  ?Sore Throat  ?This is a new problem. The current episode started yesterday. The problem has been unchanged. There has been no fever. The pain is at a severity of 5/10. The pain is mild. Associated symptoms include coughing, headaches and trouble swallowing. Pertinent negatives include no congestion, ear pain or swollen glands. Kristin Saunders has had exposure to strep. Kristin Saunders has tried acetaminophen for the symptoms. The treatment provided mild relief.  ? ? ? ?Review of Systems  ?HENT:  Positive for trouble swallowing. Negative for congestion and ear pain.   ?Respiratory:  Positive for cough.   ?Neurological:  Positive for headaches.  ?All other systems reviewed and are negative. ? ?   ?Objective:  ? Physical Exam ?Vitals reviewed.  ?Constitutional:   ?   General: Kristin Saunders is not in acute distress. ?   Appearance: Kristin Saunders is well-developed. Kristin Saunders is obese.  ?HENT:  ?   Head: Normocephalic and atraumatic.  ?   Right Ear: External ear normal.  ?   Mouth/Throat:  ?   Pharynx: Posterior oropharyngeal erythema present.  ?Eyes:  ?   Pupils: Pupils are equal, round, and reactive to light.  ?Neck:  ?   Thyroid: No thyromegaly.  ?Cardiovascular:  ?   Rate and Rhythm: Normal rate and regular rhythm.  ?   Heart sounds: Normal heart sounds. No murmur heard. ?Pulmonary:  ?   Effort: Pulmonary effort is normal. No respiratory distress.  ?   Breath sounds: Normal breath sounds. No wheezing.  ?Abdominal:  ?   General: Bowel sounds are normal. There is no distension.  ?   Palpations: Abdomen is soft.  ?   Tenderness: There is no abdominal tenderness.  ?Musculoskeletal:     ?   General: No tenderness. Normal range of motion.  ?   Cervical back: Normal range of  motion and neck supple.  ?Skin: ?   General: Skin is warm and dry.  ?Neurological:  ?   Mental Status: Kristin Saunders is alert and oriented to person, place, and time.  ?   Cranial Nerves: No cranial nerve deficit.  ?   Deep Tendon Reflexes: Reflexes are normal and symmetric.  ?Psychiatric:     ?   Behavior: Behavior normal.     ?   Thought Content: Thought content normal.     ?   Judgment: Judgment normal.  ? ? ? ? ?BP 106/76   Pulse 84   Temp (!) 97.4 ?F (36.3 ?C) (Temporal)   Ht 5\' 6"  (1.676 m)   Wt 220 lb (99.8 kg)   BMI 35.51 kg/m?  ? ?   ?Assessment & Plan:  ?Anelisse Jacobson comes in today with chief complaint of Sore Throat (Both daughters + for strep ) ? ? ?Diagnosis and orders addressed: ? ?1. Sore throat ?- Rapid Strep Screen ([facility] ONLY) ?- amoxicillin (AMOXIL) 500 MG capsule; Take 1 capsule (500 mg total) by mouth 2 (two) times daily for 10 days.  Dispense: 20 capsule; Refill: 0 ? ?2. Acute pharyngitis, unspecified etiology ?- amoxicillin (AMOXIL) 500 MG capsule; Take 1 capsule (500 mg total) by mouth 2 (two) times daily for 10  days.  Dispense: 20 capsule; Refill: 0 ? ?3. Exposure to strep throat ?- amoxicillin (AMOXIL) 500 MG capsule; Take 1 capsule (500 mg total) by mouth 2 (two) times daily for 10 days.  Dispense: 20 capsule; Refill: 0 ? ?Given both daughters are positive, will send in Amoxicillin if symptoms worsen.  ?Strep is negative, but given holiday weekend I do not want symptoms worsen. ?Continue flonase and Allegra  ? ?[name], FNP ? ? ?

## 2021-11-23 NOTE — Patient Instructions (Signed)
Pharyngitis °Pharyngitis is inflammation of the throat (pharynx). It is a very common cause of sore throat. Pharyngitis can be caused by a bacteria, but it is usually caused by a virus. Most cases of pharyngitis get better on their own without treatment. °What are the causes? °This condition may be caused by: °Infection by viruses (viral). Viral pharyngitis spreads easily from person to person (is contagious) through coughing, sneezing, and sharing of personal items or utensils such as cups, forks, spoons, and toothbrushes. °Infection by bacteria (bacterial). Bacterial pharyngitis may be spread by touching the nose or face after coming in contact with the bacteria, or through close contact, such as kissing. °Allergies. Allergies can cause buildup of mucus in the throat (post-nasal drip), leading to inflammation and irritation. Allergies can also cause blocked nasal passages, forcing breathing through the mouth, which dries and irritates the throat. °What increases the risk? °You are more likely to develop this condition if: °You are 5-24 years old. °You are exposed to crowded environments such as daycare, school, or dormitory living. °You live in a cold climate. °You have a weakened disease-fighting (immune) system. °What are the signs or symptoms? °Symptoms of this condition vary by the cause. Common symptoms of this condition include: °Sore throat. °Fatigue. °Low-grade fever. °Stuffy nose (nasal congestion) and cough. °Headache. °Other symptoms may include: °Glands in the neck (lymph nodes) that are swollen. °Skin rashes. °Plaque-like film on the throat or tonsils. This is often a symptom of bacterial pharyngitis. °Vomiting. °Red, itchy eyes (conjunctivitis). °Loss of appetite. °Joint pain and muscle aches. °Enlarged tonsils. °How is this diagnosed? °This condition may be diagnosed based on your medical history and a physical exam. Your health care provider will ask you questions about your illness and your  symptoms. °A swab of your throat may be done to check for bacteria (rapid strep test). Other lab tests may also be done, depending on the suspected cause, but these are rare. °How is this treated? °Many times, treatment is not needed for this condition. Pharyngitis usually gets better in 3-4 days without treatment. °Bacterial pharyngitis may be treated with antibiotic medicines. °Follow these instructions at home: °Medicines °Take over-the-counter and prescription medicines only as told by your health care provider. °If you were prescribed an antibiotic medicine, take it as told by your health care provider. Do not stop taking the antibiotic even if you start to feel better. °Use throat sprays to soothe your throat as told by your health care provider. °Children can get pharyngitis. Do not give your child aspirin because of the association with Reye's syndrome. °Managing pain °To help with pain, try: °Sipping warm liquids, such as broth, herbal tea, or warm water. °Eating or drinking cold or frozen liquids, such as frozen ice pops. °Gargling with a mixture of salt and water 3-4 times a day or as needed. To make salt water, completely dissolve ½-1 tsp (3-6 g) of salt in 1 cup (237 mL) of warm water. °Sucking on hard candy or throat lozenges. °Putting a cool-mist humidifier in your bedroom at night to moisten the air. °Sitting in the bathroom with the door closed for 5-10 minutes while you run hot water in the shower. ° °General instructions ° °Do not use any products that contain nicotine or tobacco. These products include cigarettes, chewing tobacco, and vaping devices, such as e-cigarettes. If you need help quitting, ask your health care provider. °Rest as told by your health care provider. °Drink enough fluid to keep your urine pale yellow. °How   is this prevented? °To help prevent becoming infected or spreading infection: °Wash your hands often with soap and water for at least 20 seconds. If soap and water are not  available, use hand sanitizer. °Do not touch your eyes, nose, or mouth with unwashed hands, and wash hands after touching these areas. °Do not share cups or eating utensils. °Avoid close contact with people who are sick. °Contact a health care provider if: °You have large, tender lumps in your neck. °You have a rash. °You cough up green, yellow-brown, or bloody mucus. °Get help right away if: °Your neck becomes stiff. °You drool or are unable to swallow liquids. °You cannot drink or take medicines without vomiting. °You have severe pain that does not go away, even after you take medicine. °You have trouble breathing, and it is not caused by a stuffy nose. °You have new pain and swelling in your joints such as the knees, ankles, wrists, or elbows. °These symptoms may represent a serious problem that is an emergency. Do not wait to see if the symptoms will go away. Get medical help right away. Call your local emergency services (911 in the [place] Do not drive yourself to the hospital. °Summary °Pharyngitis is redness, pain, and swelling (inflammation) of the throat (pharynx). °While pharyngitis can be caused by a bacteria, the most common causes are viral. °Most cases of pharyngitis get better on their own without treatment. °Bacterial pharyngitis is treated with antibiotic medicines. °This information is not intended to replace advice given to you by your health care provider. Make sure you discuss any questions you have with your health care provider. °Document Revised: 11/03/2020 Document Reviewed: 11/03/2020 °Elsevier Patient Education © 2022 [employer] ° °

## 2021-12-02 ENCOUNTER — Ambulatory Visit
Admission: RE | Admit: 2021-12-02 | Discharge: 2021-12-02 | Disposition: A | Discharge status: Home / Self Care | Payer: Medicaid Other | Source: Ambulatory Visit | Attending: Specialist | Admitting: Specialist

## 2021-12-02 DIAGNOSIS — M47812 Spondylosis without myelopathy or radiculopathy, cervical region: Secondary | ICD-10-CM

## 2021-12-02 DIAGNOSIS — R200 Anesthesia of skin: Secondary | ICD-10-CM

## 2021-12-02 DIAGNOSIS — M542 Cervicalgia: Secondary | ICD-10-CM

## 2021-12-02 DIAGNOSIS — M4802 Spinal stenosis, cervical region: Secondary | ICD-10-CM | POA: Diagnosis not present

## 2021-12-02 IMAGING — MR MR CERVICAL SPINE W/O CM
4 of 5 series · 28 of 48 positions shown · non-contrast
Comparison: Prior radiographs from [DATE].

CLINICAL DATA: Initial evaluation for spinal stenosis. Neck pain
radiating into both shoulders and upper extremities, bilateral hand
weakness.

EXAM:
MRI CERVICAL SPINE WITHOUT CONTRAST
TECHNIQUE: Multiplanar, multisequence MR imaging of the cervical spine was
performed. No intravenous contrast was administered.

[Series 2: T2 · sagittal · 3.0mm · 0.82mm/px · 6 of 20 slices shown (1 of 2)]
[im 1/20]
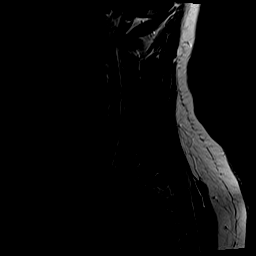
[im 4/20]
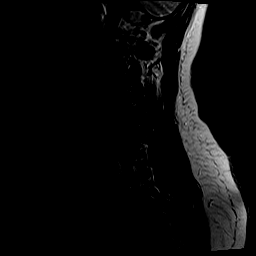
[im 8/20]
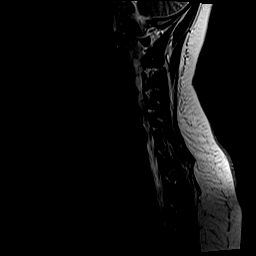
[im 12/20]
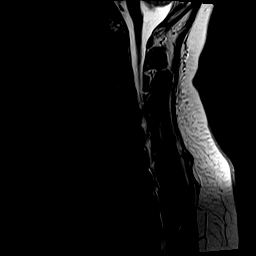
[im 16/20]
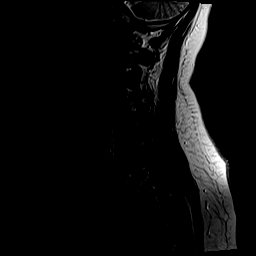
[im 20/20]
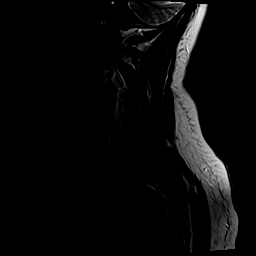

[Series 3: T1 · sagittal · 3.0mm · 0.41mm/px · 7 of 20 slices shown]
[im 1/20]
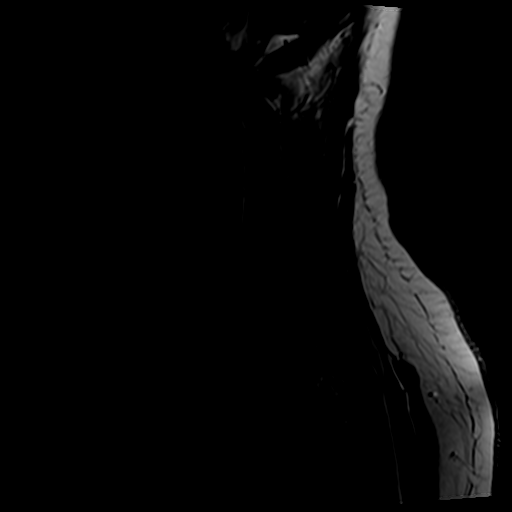
[im 4/20]
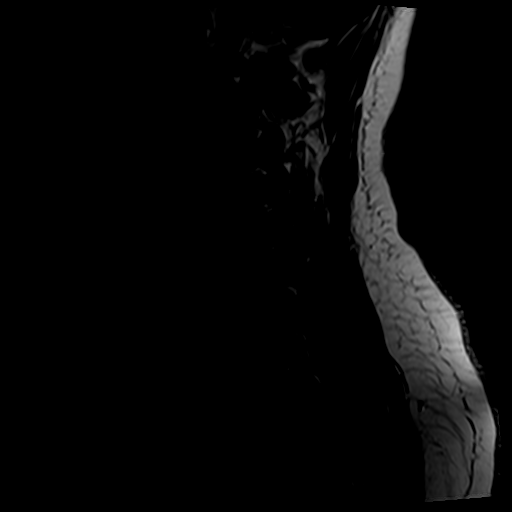
[im 7/20]
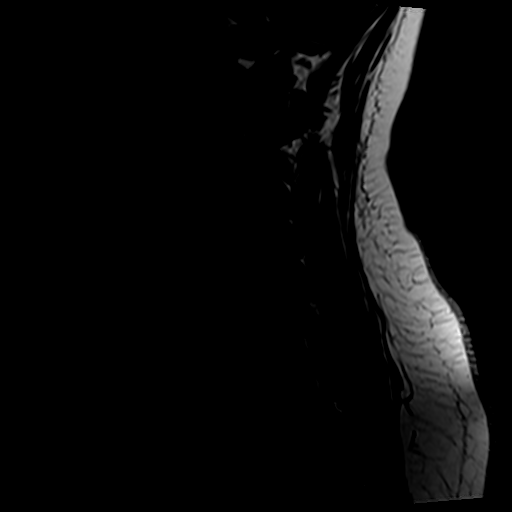
[im 10/20]
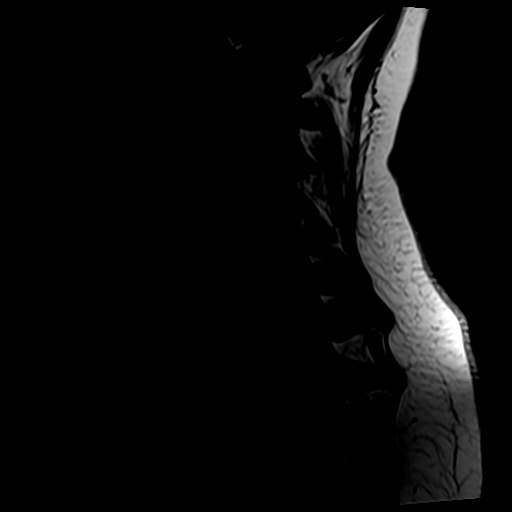
[im 13/20]
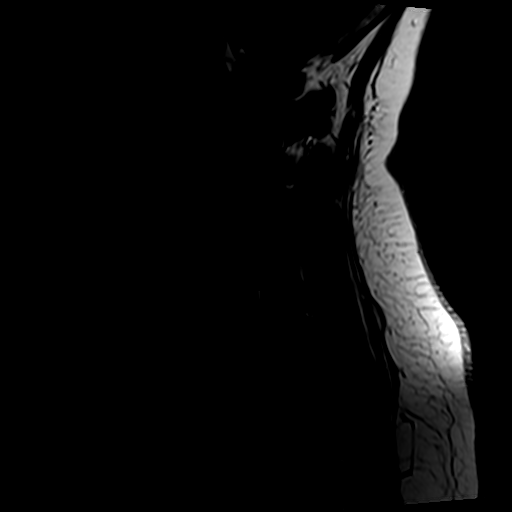
[im 16/20]
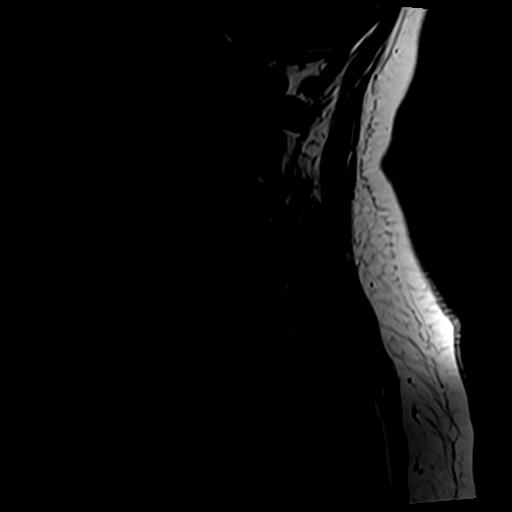
[im 20/20]
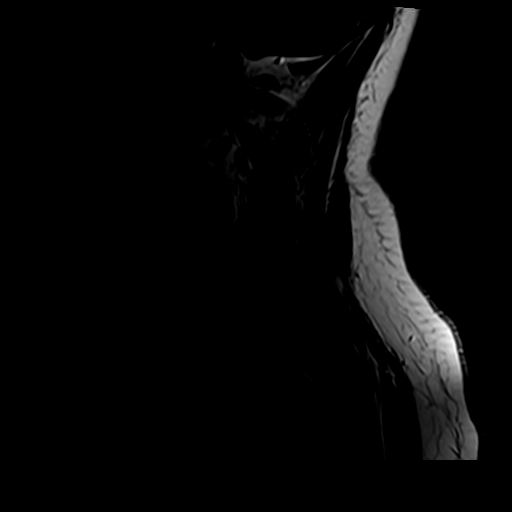

[Series 4: tir sag · sagittal · 3.0mm · 0.43mm/px · 7 of 20 slices shown]
[im 1/20]
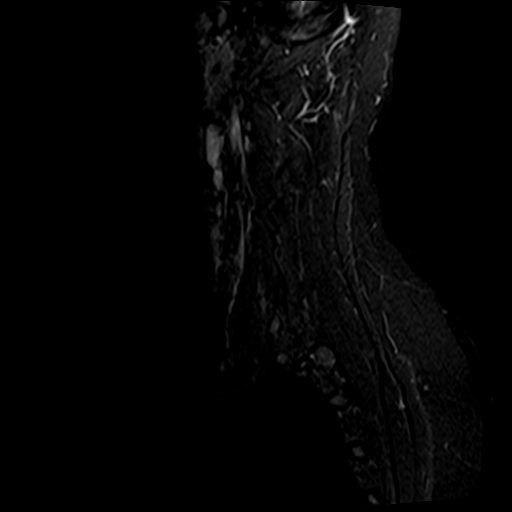
[im 4/20]
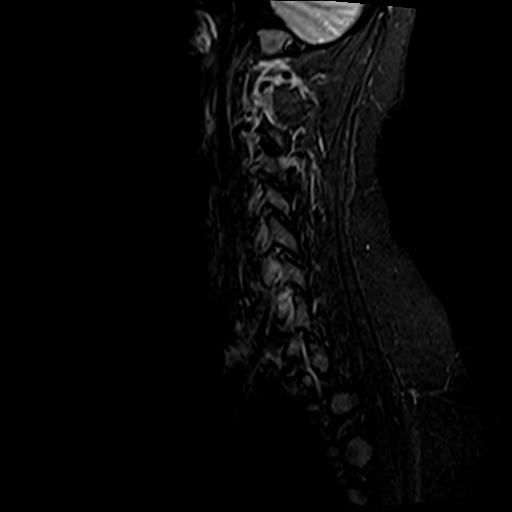
[im 7/20]
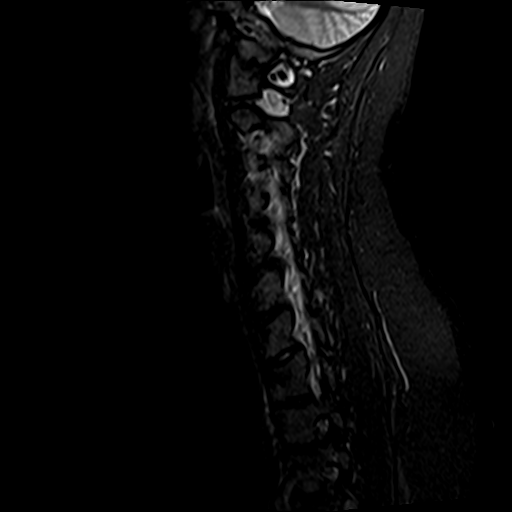
[im 10/20]
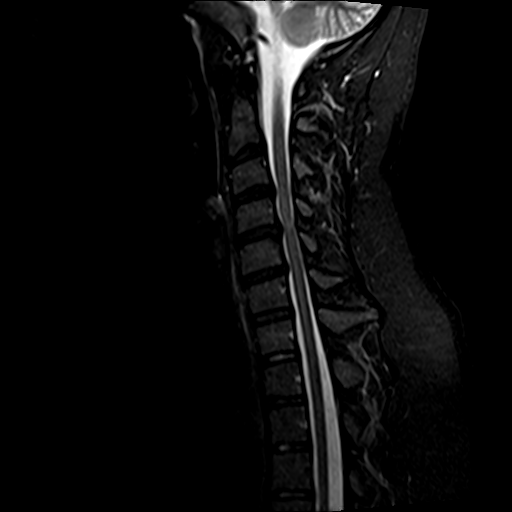
[im 13/20]
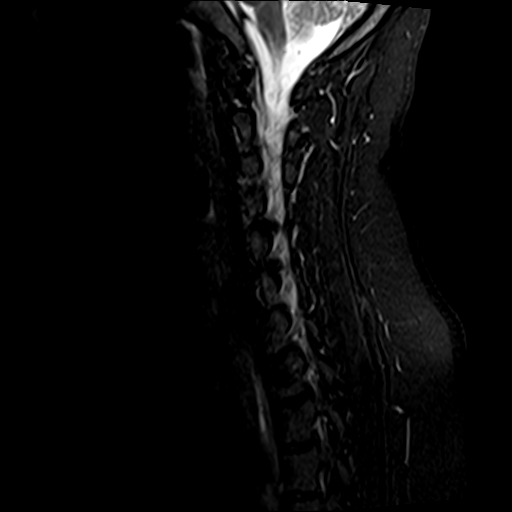
[im 16/20]
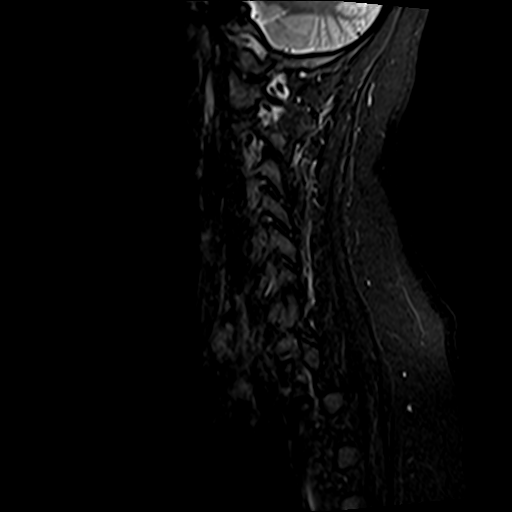
[im 20/20]
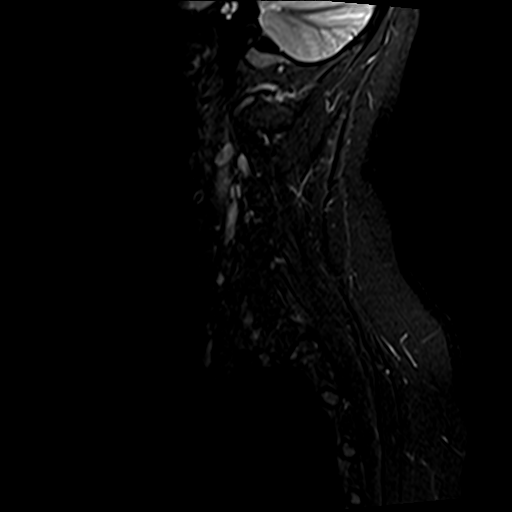

[Series 6: T2 · axial · 3.0mm · 0.78mm/px · z∈[-42,+111]mm · 8 of 42 slices shown (2 of 2)]
[im 1/42]
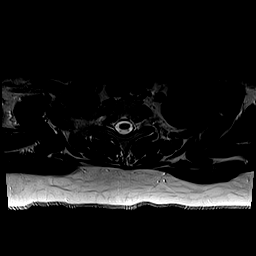
[im 7/42]
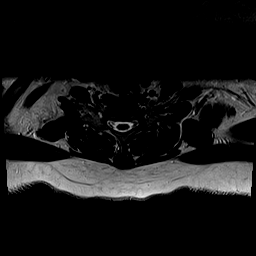
[im 13/42]
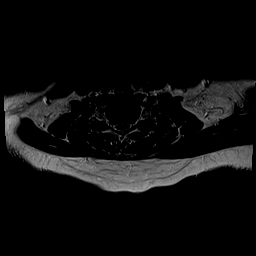
[im 19/42]
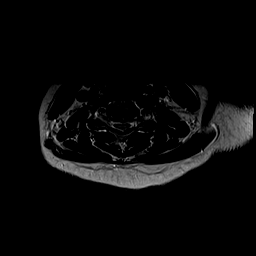
[im 23/42]
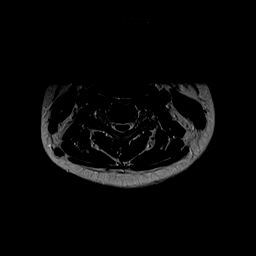
[im 29/42]
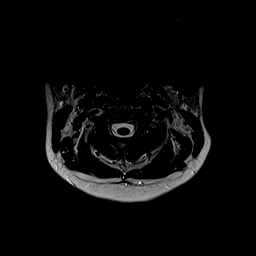
[im 35/42]
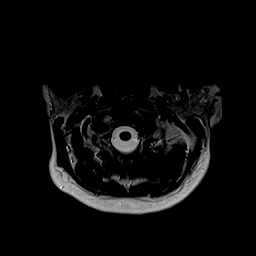
[im 42/42]
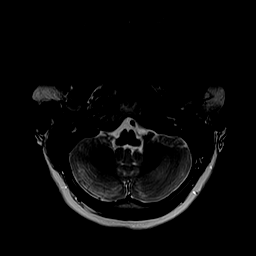

[28 of 48 positions shown; findings below may reference images not displayed]

FINDINGS: Alignment: Straightening of the normal cervical lordosis. No
listhesis.

Vertebrae: Vertebral body height maintained without acute or chronic
fracture. Bone marrow signal intensity within normal limits.
Subcentimeter benign hemangioma noted within the C5 vertebral body.
No worrisome osseous lesions. No abnormal marrow edema.

Cord: Normal signal and morphology. No convincing cord signal
changes.

Posterior Fossa, vertebral arteries, paraspinal tissues: Visualized
brain and posterior fossa within normal limits. Craniocervical
junction normal. Paraspinous and prevertebral soft tissues within
normal limits. Normal intravascular flow voids seen within the
vertebral arteries bilaterally.

Disc levels:

C2-C3: Normal interspace. Minimal left-sided facet spurring. No
canal or foraminal stenosis.

C3-C4: Disc bulge with mild endplate spurring. Flattening and
partial effacement of the ventral thecal sac, slightly asymmetric to
the right. Mild cord flattening without cord signal changes.
Moderate spinal stenosis. Foramina remain patent.

C4-C5: Disc bulge with endplate spurring. Broad posterior disc
osteophyte flattens and effaces the ventral thecal sac. Secondary
cord flattening without definite cord signal changes. Severe spinal
stenosis with the thecal sac measuring 4 mm in AP diameter at its
most narrow point. Foramina remain patent.

C5-C6: Disc bulge with uncovertebral spurring. Broad posterior disc
osteophyte flattens and effaces the ventral thecal sac. Secondary
cord flattening without cord signal changes. Moderate spinal
stenosis. Mild left greater than right C6 foraminal narrowing.

C6-C7: Mild disc bulge. Flattening of the ventral thecal sac without
significant spinal stenosis. Foramina remain patent.

C7-T1: Normal interspace. Mild facet hypertrophy on the left. No
canal or foraminal stenosis.

Visualized upper thoracic spine demonstrates no significant finding.
IMPRESSION: 1. Multilevel cervical spondylosis with resultant moderate to severe
spinal stenosis at C3-4 through C5-6, most pronounced at C4-5.
Secondary cord flattening at these levels without convincing cord
signal changes.
2. Mild bilateral C6 foraminal narrowing related to disc bulge and
uncovertebral disease. No other significant foraminal encroachment
within the cervical spine.

## 2021-12-06 DIAGNOSIS — R070 Pain in throat: Secondary | ICD-10-CM | POA: Diagnosis not present

## 2021-12-06 DIAGNOSIS — J40 Bronchitis, not specified as acute or chronic: Secondary | ICD-10-CM | POA: Diagnosis not present

## 2021-12-06 DIAGNOSIS — R051 Acute cough: Secondary | ICD-10-CM | POA: Diagnosis not present

## 2021-12-09 ENCOUNTER — Telehealth: Payer: Self-pay

## 2021-12-09 NOTE — Telephone Encounter (Signed)
Patient called into the office stating that she would like a call back regarding the results of her MRI  ?

## 2021-12-15 NOTE — Telephone Encounter (Signed)
I called and scheduled for 01/05/22 @ 1:45, advised that he reviews MRI's in the office so he can show her the pictures. ?

## 2022-01-05 ENCOUNTER — Ambulatory Visit: Payer: Medicaid Other | Admitting: Specialist

## 2022-01-05 ENCOUNTER — Encounter: Payer: Self-pay | Admitting: Specialist

## 2022-01-05 ENCOUNTER — Ambulatory Visit (INDEPENDENT_AMBULATORY_CARE_PROVIDER_SITE_OTHER): Payer: Medicaid Other | Admitting: Specialist

## 2022-01-05 VITALS — BP 110/77 | HR 67 | Ht 66.0 in | Wt 260.0 lb

## 2022-01-05 DIAGNOSIS — M4802 Spinal stenosis, cervical region: Secondary | ICD-10-CM

## 2022-01-05 DIAGNOSIS — M5020 Other cervical disc displacement, unspecified cervical region: Secondary | ICD-10-CM | POA: Diagnosis not present

## 2022-01-05 DIAGNOSIS — R292 Abnormal reflex: Secondary | ICD-10-CM

## 2022-01-05 NOTE — Progress Notes (Addendum)
Office Visit Note   Patient: Kristin Saunders           Date of Birth: April 13, 1991           MRN: [id number] Visit Date: 01/05/2022              Requested by: Junie Spencer, FNP [address] [place],  [place] [address] PCP: Junie Spencer, FNP   Assessment & Plan: Visit Diagnoses:  1. Spinal stenosis of cervical region   2. Hyperreflexia of lower extremity   3. Cervical disc herniation     Plan: Avoid overhead lifting and overhead use of the arms. Do not lift greater than 5 lbs. Adjust head rest in vehicle to prevent hyperextension if rear ended. Take extra precautions to avoid falling, including use of a cane if you feel weak. Scheduling secretary Tivis Ringer will call you to arrange for surgery for your cervical spine. If you wish a second opinion please let us know and we can arrange for you. If you have worsening arm or leg numbness or weakness please call or go to an ER. We will contact your cardiologist and primary care physicians to seek clearance for your surgery. Surgery will be a decompression and fusion at the C4-5 level with bone grafting and plate and screws,Artificial disc with decompression at C3-4 and C5-6 for cervical stenosis due to disc protrusions worst at C4-5 with canal at 4 mm. Intraoperative cord monitoring. Local bone graft as well and allograft bone graft and vivigen.  Risks of surgery include risks of infection, bleeding and risks to the spinal cord and  Risks of sore throat and difficulty swallowing which should  Improve over the next 4-6 weeks following surgery. Surgery is indicated due to upper extremity radiculopathy. In the future surgery at adjacent levels may be necessary but these levels do not appear to be related to your current symptoms or signs.   Follow-Up Instructions: Return in about 4 weeks (around 02/02/2022).   Orders:  No orders of the defined types were placed in this encounter.  No orders of the defined types were placed  in this encounter.     Procedures: No procedures performed   Clinical Data: Findings:  CLINICAL DATA:  Initial evaluation for spinal stenosis. Neck pain radiating into both shoulders and upper extremities, bilateral hand weakness.   EXAM: MRI CERVICAL SPINE WITHOUT CONTRAST   TECHNIQUE: Multiplanar, multisequence MR imaging of the cervical spine was performed. No intravenous contrast was administered.   COMPARISON:  Prior radiographs from 04/06/2021.   FINDINGS: Alignment: Straightening of the normal cervical lordosis. No listhesis.   Vertebrae: Vertebral body height maintained without acute or chronic fracture. Bone marrow signal intensity within normal limits. Subcentimeter benign hemangioma noted within the C5 vertebral body. No worrisome osseous lesions. No abnormal marrow edema.   Cord: Normal signal and morphology. No convincing cord signal changes.   Posterior Fossa, vertebral arteries, paraspinal tissues: Visualized brain and posterior fossa within normal limits. Craniocervical junction normal. Paraspinous and prevertebral soft tissues within normal limits. Normal intravascular flow voids seen within the vertebral arteries bilaterally.   Disc levels:   C2-C3: Normal interspace. Minimal left-sided facet spurring. No canal or foraminal stenosis.   C3-C4: Disc bulge with mild endplate spurring. Flattening and partial effacement of the ventral thecal sac, slightly asymmetric to the right. Mild cord flattening without cord signal changes. Moderate spinal stenosis. Foramina remain patent.   C4-C5: Disc bulge with endplate spurring. Broad posterior disc osteophyte flattens  and effaces the ventral thecal sac. Secondary cord flattening without definite cord signal changes. Severe spinal stenosis with the thecal sac measuring 4 mm in AP diameter at its most narrow point. Foramina remain patent.   C5-C6: Disc bulge with uncovertebral spurring. Broad posterior  disc osteophyte flattens and effaces the ventral thecal sac. Secondary cord flattening without cord signal changes. Moderate spinal stenosis. Mild left greater than right C6 foraminal narrowing.   C6-C7: Mild disc bulge. Flattening of the ventral thecal sac without significant spinal stenosis. Foramina remain patent.   C7-T1: Normal interspace. Mild facet hypertrophy on the left. No canal or foraminal stenosis.   Visualized upper thoracic spine demonstrates no significant finding.   IMPRESSION: 1. Multilevel cervical spondylosis with resultant moderate to severe spinal stenosis at C3-4 through C5-6, most pronounced at C4-5. Secondary cord flattening at these levels without convincing cord signal changes. 2. Mild bilateral C6 foraminal narrowing related to disc bulge and uncovertebral disease. No other significant foraminal encroachment within the cervical spine.     Electronically Signed   By: Rise MuBenjamin  McClintock M.D.   On: 12/03/2021 05:33     Subjective: Chief Complaint  Patient presents with   Neck - Follow-up    MRI Review    31 year old female with history of neck and bilateral shoulder pain and weakness into both arms. She underwent lumbar fusion surgery for spondylolisthesis one year ago but her neck and shoulder pain is worsening with paresthesias into the right arm and neck. No bowel or bladder difficulty. Pain is present continuously and the numbness and tingling is also continuous. No bowel or bladder difficulty. She is walking and with being on her feet a lot she has increased symptoms into her shoulders.   Review of Systems  Constitutional: Negative.   HENT: Negative.    Eyes: Negative.   Respiratory: Negative.    Cardiovascular: Negative.   Gastrointestinal: Negative.   Endocrine: Negative.   Genitourinary: Negative.   Musculoskeletal: Negative.   Skin: Negative.   Allergic/Immunologic: Negative.   Neurological: Negative.   Hematological: Negative.    Psychiatric/Behavioral: Negative.      Objective: Vital Signs: BP 110/77 (BP Location: Left Arm, Patient Position: Sitting)   Pulse 67   Ht 5\' 6"  (1.676 m)   Wt 260 lb (117.9 kg)   BMI 41.97 kg/m   Physical Exam Musculoskeletal:     Lumbar back: Negative right straight leg raise test and negative left straight leg raise test.   Back Exam   Tenderness  The patient is experiencing tenderness in the cervical.  Range of Motion  Extension:  60 abnormal  Flexion:  80  Lateral bend right:  normal  Lateral bend left:  normal  Rotation right:  normal  Rotation left:  normal   Muscle Strength  Right Quadriceps:  5/5  Left Quadriceps:  5/5  Right Hamstrings:  5/5  Left Hamstrings:  5/5   Tests  Straight leg raise right: negative Straight leg raise left: negative  Reflexes  Patellar:  3/4 Achilles:  3/4 Biceps:  2/4  Other  Toe walk: normal Heel walk: normal Sensation: normal  Comments:  UE motor is intact and normal.     Specialty Comments:  No specialty comments available.  Imaging: No results found.   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   Obstructive sleep apnea of adult 05/13/2020   Obesity (BMI 30.0-34.9) 03/19/2018   Past Medical History:  Diagnosis Date   Anxiety    Depression    GERD (gastroesophageal reflux disease)    Headache    Medical history non-contributory     Family History  Problem Relation Age of Onset   Cancer Father        COLON    Past Surgical History:  Procedure Laterality Date   NO PAST SURGERIES     TUBAL LIGATION     Social History    Occupational History   Not on file  Tobacco Use   Smoking status: Never   Smokeless tobacco: Never  Vaping Use   Vaping Use: Never used  Substance and Sexual Activity   Alcohol use: Yes    Comment: rare   Drug use: No   Sexual activity: Yes    Birth control/protection: None, Surgical

## 2022-01-05 NOTE — Patient Instructions (Signed)
Avoid overhead lifting and overhead use of the arms. Do not lift greater than 5 lbs. Adjust head rest in vehicle to prevent hyperextension if rear ended. Take extra precautions to avoid falling, including use of a cane if you feel weak. Scheduling secretary Tivis Ringer will call you to arrange for surgery for your cervical spine. If you wish a second opinion please let us know and we can arrange for you. If you have worsening arm or leg numbness or weakness please call or go to an ER. We will contact your cardiologist and primary care physicians to seek clearance for your surgery. Surgery will be a decompression and fusion at the C4-5 level with bone grafting and plate and screws,Artificial disc with decompression at C3-4 and C5-6 for cervical stenosis due to disc protrusions worst at C4-5 with canal at 4 mm. Intraoperative cord monitoring. Local bone graft as well and allograft bone graft and vivigen.  Risks of surgery include risks of infection, bleeding and risks to the spinal cord and  Risks of sore throat and difficulty swallowing which should  Improve over the next 4-6 weeks following surgery. Surgery is indicated due to upper extremity radiculopathy. In the future surgery at adjacent levels may be necessary but these levels do not appear to be related to your current symptoms or signs.

## 2022-01-30 ENCOUNTER — Other Ambulatory Visit: Payer: Self-pay

## 2022-02-01 NOTE — Pre-Procedure Instructions (Signed)
Surgical Instructions    Your procedure is scheduled on Tuesday, February 07, 2022.  Report to [facility] Main Entrance "A" at 5:30 A.M., then check in with the Admitting office.  Call this number if you have problems the morning of surgery:  [phone]   If you have any questions prior to your surgery date call [phone]: Open Monday-Friday 8am-4pm    Remember:  Do not eat after midnight the night before your surgery  You may drink clear liquids until 4:30 AM the morning of your surgery.   Clear liquids allowed are: Water, Non-Citrus Juices (without pulp), Carbonated Beverages, Clear Tea, Black Coffee Only (NO MILK, CREAM OR POWDERED CREAMER of any kind), and Gatorade.  Patient Instructions  The night before surgery:  No food after midnight. ONLY clear liquids after midnight  The day of surgery (if you do NOT have diabetes):  Drink ONE (1) Pre-Surgery Clear Ensure by 4:30 AM the morning of surgery. Drink in one sitting. Do not sip.  This drink was given to you during your hospital  pre-op appointment visit.  Nothing else to drink after completing the  Pre-Surgery Clear Ensure.         If you have questions, please contact your surgeon's office.     Take these medicines the morning of surgery with A SIP OF WATER:  methocarbamol (ROBAXIN)    As of today, STOP taking any Aspirin (unless otherwise instructed by your surgeon) Aleve, Naproxen, Ibuprofen, Motrin, Advil, Goody's, BC's, all herbal medications, fish oil, and all vitamins.                     Do NOT Smoke (Tobacco/Vaping) for 24 hours prior to your procedure.  If you use a CPAP at night, you may bring your mask/headgear for your overnight stay.   Contacts, glasses, piercing's, hearing aid's, dentures or partials may not be worn into surgery, please bring cases for these belongings.    For patients admitted to the hospital, discharge time will be determined by your treatment team.   Patients discharged the day  of surgery will not be allowed to drive home, and someone needs to stay with them for 24 hours.  SURGICAL WAITING ROOM VISITATION Patients having surgery or a procedure may have two support people in the waiting room. These visitors may be switched out with other visitors if needed. Children under the age of 91 must have an adult accompany them who is not the patient. If the patient needs to stay at the hospital during part of their recovery, the visitor guidelines for inpatient rooms apply.  Please refer to the [facility] website for the visitor guidelines for Inpatients (after your surgery is over and you are in a regular room).    Special instructions:   [facility]- Preparing For Surgery  Before surgery, you can play an important role. Because skin is not sterile, your skin needs to be as free of germs as possible. You can reduce the number of germs on your skin by washing with CHG (chlorahexidine gluconate) Soap before surgery.  CHG is an antiseptic cleaner which kills germs and bonds with the skin to continue killing germs even after washing.    Oral Hygiene is also important to reduce your risk of infection.  Remember - BRUSH YOUR TEETH THE MORNING OF SURGERY WITH YOUR REGULAR TOOTHPASTE  Please do not use if you have an allergy to CHG or antibacterial soaps. If your skin becomes reddened/irritated stop using the  CHG.  Do not shave (including legs and underarms) for at least 48 hours prior to first CHG shower. It is OK to shave your face.  Please follow these instructions carefully.   Shower the NIGHT BEFORE SURGERY and the MORNING OF SURGERY  If you chose to wash your hair, wash your hair first as usual with your normal shampoo.  After you shampoo, rinse your hair and body thoroughly to remove the shampoo.  Use CHG Soap as you would any other liquid soap. You can apply CHG directly to the skin and wash gently with a scrungie or a clean washcloth.   Apply the CHG Soap to your  body ONLY FROM THE NECK DOWN.  Do not use on open wounds or open sores. Avoid contact with your eyes, ears, mouth and genitals (private parts). Wash Face and genitals (private parts)  with your normal soap.   Wash thoroughly, paying special attention to the area where your surgery will be performed.  Thoroughly rinse your body with warm water from the neck down.  DO NOT shower/wash with your normal soap after using and rinsing off the CHG Soap.  Pat yourself dry with a CLEAN TOWEL.  Wear CLEAN PAJAMAS to bed the night before surgery  Place CLEAN SHEETS on your bed the night before your surgery  DO NOT SLEEP WITH PETS.   Day of Surgery: Take a shower with CHG soap. Do not wear jewelry or makeup Do not wear lotions, powders, perfumes/colognes, or deodorant. Do not shave 48 hours prior to surgery.  Men may shave face and neck. Do not bring valuables to the hospital.  [facility] is not responsible for any belongings or valuables. Do not wear nail polish, gel polish, artificial nails, or any other type of covering on natural nails (fingers and toes) If you have artificial nails or gel coating that need to be removed by a nail salon, please have this removed prior to surgery. Artificial nails or gel coating may interfere with anesthesia's ability to adequately monitor your vital signs. Wear Clean/Comfortable clothing the morning of surgery Remember to brush your teeth WITH YOUR REGULAR TOOTHPASTE.   Please read over the following fact sheets that you were given.    If you received a COVID test during your pre-op visit  it is requested that you wear a mask when out in public, stay away from anyone that may not be feeling well and notify your surgeon if you develop symptoms. If you have been in contact with anyone that has tested positive in the last 10 days please notify you surgeon.

## 2022-02-02 ENCOUNTER — Ambulatory Visit: Payer: Medicaid Other | Admitting: Specialist

## 2022-02-02 ENCOUNTER — Encounter (HOSPITAL_COMMUNITY)
Admission: RE | Admit: 2022-02-02 | Discharge: 2022-02-02 | Disposition: A | Discharge status: Home / Self Care | Payer: Medicaid Other | Source: Ambulatory Visit | Attending: Specialist | Admitting: Specialist

## 2022-02-02 ENCOUNTER — Other Ambulatory Visit: Payer: Self-pay

## 2022-02-02 ENCOUNTER — Encounter (HOSPITAL_COMMUNITY): Payer: Self-pay

## 2022-02-02 DIAGNOSIS — Z01818 Encounter for other preprocedural examination: Secondary | ICD-10-CM | POA: Diagnosis not present

## 2022-02-02 HISTORY — DX: Unspecified osteoarthritis, unspecified site: M19.90

## 2022-02-02 LAB — CBC
HCT: 42.4 % (ref 36.0–46.0)
Hemoglobin: 13.9 g/dL (ref 12.0–15.0)
MCH: 31.4 pg (ref 26.0–34.0)
MCHC: 32.8 g/dL (ref 30.0–36.0)
MCV: 95.7 fL (ref 80.0–100.0)
Platelets: 235 10*3/uL (ref 150–400)
RBC: 4.43 MIL/uL (ref 3.87–5.11)
RDW: 13 % (ref 11.5–15.5)
WBC: 5.7 10*3/uL (ref 4.0–10.5)
nRBC: 0 % (ref 0.0–0.2)

## 2022-02-02 LAB — SURGICAL PCR SCREEN
MRSA, PCR: NEGATIVE
Staphylococcus aureus: NEGATIVE

## 2022-02-02 NOTE — Progress Notes (Signed)
PCP - [name], FNP Cardiologist - denies  PPM/ICD - denies   Chest x-ray - 07/20/20 EKG - 02/02/22 Stress Test - denies ECHO - denies Cardiac Cath - denies  Sleep Study - 10+ years ago, OSA+ CPAP - denies  DM- denies  ASA/Blood Thinner Instructions: n/a   ERAS Protcol - yes, no drink   COVID TEST- n/a   Anesthesia review: no  Patient denies shortness of breath, fever, cough and chest pain at PAT appointment   All instructions explained to the patient, with a verbal understanding of the material. Patient agrees to go over the instructions while at home for a better understanding. Patient also instructed to notify surgeon of any contact with COVID+ person or if she develops any symptoms. The opportunity to ask questions was provided.

## 2022-02-03 ENCOUNTER — Encounter: Payer: Self-pay | Admitting: Surgery

## 2022-02-03 ENCOUNTER — Telehealth: Payer: Self-pay | Admitting: Specialist

## 2022-02-03 ENCOUNTER — Ambulatory Visit: Payer: Medicaid Other | Admitting: Surgery

## 2022-02-03 VITALS — BP 107/75 | HR 70

## 2022-02-03 DIAGNOSIS — M47812 Spondylosis without myelopathy or radiculopathy, cervical region: Secondary | ICD-10-CM

## 2022-02-03 NOTE — Progress Notes (Signed)
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  States that symptoms unchanged from previous visit and she is wanting to proceed with ANTERIOR CERVICAL DISCECTOMY AND  FUSION C3-4,  C4-5 WITH PLATE, SCREWS, LOCAL BONE GRAFT AND VIVIGEN and total cervical disc arthroplasty C5-6 as scheduled.  Today history and physical performed.  Review of systems negative.  Plan We will proceed with surgery as scheduled.  Advised patient that she can anticipate being out of work at least 8 to 10 weeks postop but understands that this could be longer.  All questions answered.

## 2022-02-03 NOTE — Telephone Encounter (Signed)
Received $25.00 cash,medical records release form and FMLA paperwork from patient/Forwarding to [employer] today 

## 2022-02-06 ENCOUNTER — Encounter (HOSPITAL_COMMUNITY): Payer: Self-pay | Admitting: Specialist

## 2022-02-06 NOTE — Anesthesia Preprocedure Evaluation (Signed)
Anesthesia Evaluation  Patient identified by MRN, date of birth, ID band Patient awake    Reviewed: Allergy & Precautions, NPO status , Patient's Chart, lab work & pertinent test results  Airway Mallampati: II  TM Distance: >3 FB Neck ROM: Full    Dental no notable dental hx. (+) Teeth Intact, Dental Advisory Given   Pulmonary sleep apnea and Continuous Positive Airway Pressure Ventilation ,    Pulmonary exam normal breath sounds clear to auscultation       Cardiovascular negative cardio ROS Normal cardiovascular exam Rhythm:Regular Rate:Normal     Neuro/Psych  Headaches, PSYCHIATRIC DISORDERS Anxiety Depression  Neuromuscular disease    GI/Hepatic Neg liver ROS, GERD  Medicated,  Endo/Other  Morbid obesity  Renal/GU negative Renal ROS  negative genitourinary   Musculoskeletal  (+) Arthritis , Osteoarthritis,  HNP C3-4, C4-5 with cervical stenosis   Abdominal (+) + obese,   Peds  Hematology negative hematology ROS (+)   Anesthesia Other Findings   Reproductive/Obstetrics                            Anesthesia Physical Anesthesia Plan  ASA: 3  Anesthesia Plan: General   Post-op Pain Management: Tylenol PO (pre-op)*, Precedex, Ketamine IV* and Dilaudid IV   Induction: Intravenous  PONV Risk Score and Plan: 4 or greater and Treatment may vary due to age or medical condition, Midazolam, Ondansetron and Dexamethasone  Airway Management Planned: Oral ETT and Video Laryngoscope Planned  Additional Equipment: None  Intra-op Plan:   Post-operative Plan: Extubation in OR  Informed Consent: I have reviewed the patients History and Physical, chart, labs and discussed the procedure including the risks, benefits and alternatives for the proposed anesthesia with the patient or authorized representative who has indicated his/her understanding and acceptance.     Dental advisory given  Plan  Discussed with: CRNA and Anesthesiologist  Anesthesia Plan Comments:        Anesthesia Quick Evaluation

## 2022-02-07 ENCOUNTER — Ambulatory Visit (HOSPITAL_COMMUNITY): Payer: Medicaid Other | Admitting: Anesthesiology

## 2022-02-07 ENCOUNTER — Other Ambulatory Visit: Payer: Self-pay

## 2022-02-07 ENCOUNTER — Ambulatory Visit (HOSPITAL_BASED_OUTPATIENT_CLINIC_OR_DEPARTMENT_OTHER): Payer: Medicaid Other | Admitting: Anesthesiology

## 2022-02-07 ENCOUNTER — Encounter (HOSPITAL_COMMUNITY): Payer: Self-pay | Admitting: Specialist

## 2022-02-07 ENCOUNTER — Ambulatory Visit (HOSPITAL_COMMUNITY): Payer: Medicaid Other

## 2022-02-07 ENCOUNTER — Ambulatory Visit (HOSPITAL_COMMUNITY)
Admission: RE | Disposition: A | Discharge status: Home / Self Care | Payer: Self-pay | Source: Home / Self Care | Attending: Specialist

## 2022-02-07 ENCOUNTER — Observation Stay (HOSPITAL_COMMUNITY)
Admission: RE | Admit: 2022-02-07 | Discharge: 2022-02-08 | Disposition: A | Discharge status: Home / Self Care | Payer: Medicaid Other | Attending: Specialist | Admitting: Specialist

## 2022-02-07 DIAGNOSIS — M50222 Other cervical disc displacement at C5-C6 level: Secondary | ICD-10-CM | POA: Insufficient documentation

## 2022-02-07 DIAGNOSIS — M2578 Osteophyte, vertebrae: Secondary | ICD-10-CM

## 2022-02-07 DIAGNOSIS — M4322 Fusion of spine, cervical region: Secondary | ICD-10-CM | POA: Diagnosis not present

## 2022-02-07 DIAGNOSIS — Z01818 Encounter for other preprocedural examination: Secondary | ICD-10-CM

## 2022-02-07 DIAGNOSIS — M5021 Other cervical disc displacement,  high cervical region: Secondary | ICD-10-CM | POA: Insufficient documentation

## 2022-02-07 DIAGNOSIS — G992 Myelopathy in diseases classified elsewhere: Secondary | ICD-10-CM | POA: Diagnosis not present

## 2022-02-07 DIAGNOSIS — Z981 Arthrodesis status: Secondary | ICD-10-CM

## 2022-02-07 DIAGNOSIS — Z9989 Dependence on other enabling machines and devices: Secondary | ICD-10-CM | POA: Diagnosis not present

## 2022-02-07 DIAGNOSIS — M4802 Spinal stenosis, cervical region: Secondary | ICD-10-CM | POA: Diagnosis not present

## 2022-02-07 DIAGNOSIS — G4733 Obstructive sleep apnea (adult) (pediatric): Secondary | ICD-10-CM

## 2022-02-07 HISTORY — PX: CERVICAL DISC ARTHROPLASTY: SHX587

## 2022-02-07 HISTORY — PX: ANTERIOR CERVICAL DECOMP/DISCECTOMY FUSION: SHX1161

## 2022-02-07 LAB — POCT PREGNANCY, URINE: Preg Test, Ur: NEGATIVE

## 2022-02-07 IMAGING — RF DG CERVICAL SPINE 2 OR 3 VIEWS
1 series · 3 of 3 positions shown · non-contrast
Comparison: MRI cervical spine dated [DATE].

CLINICAL DATA: Cervical fusion.

EXAM:
CERVICAL SPINE - 2-3 VIEW

[Series 1: run · 3 of 3 slices shown]
[im 1/3]
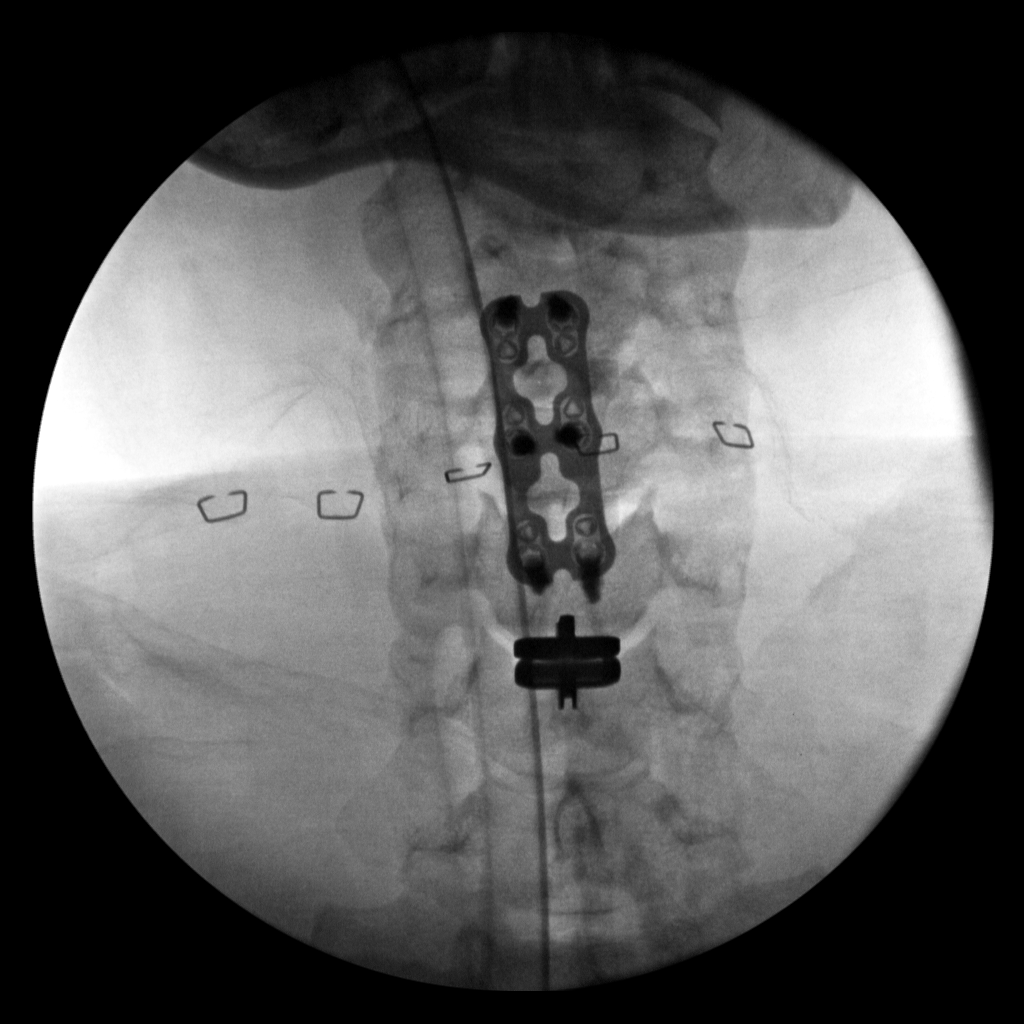
[im 2/3]
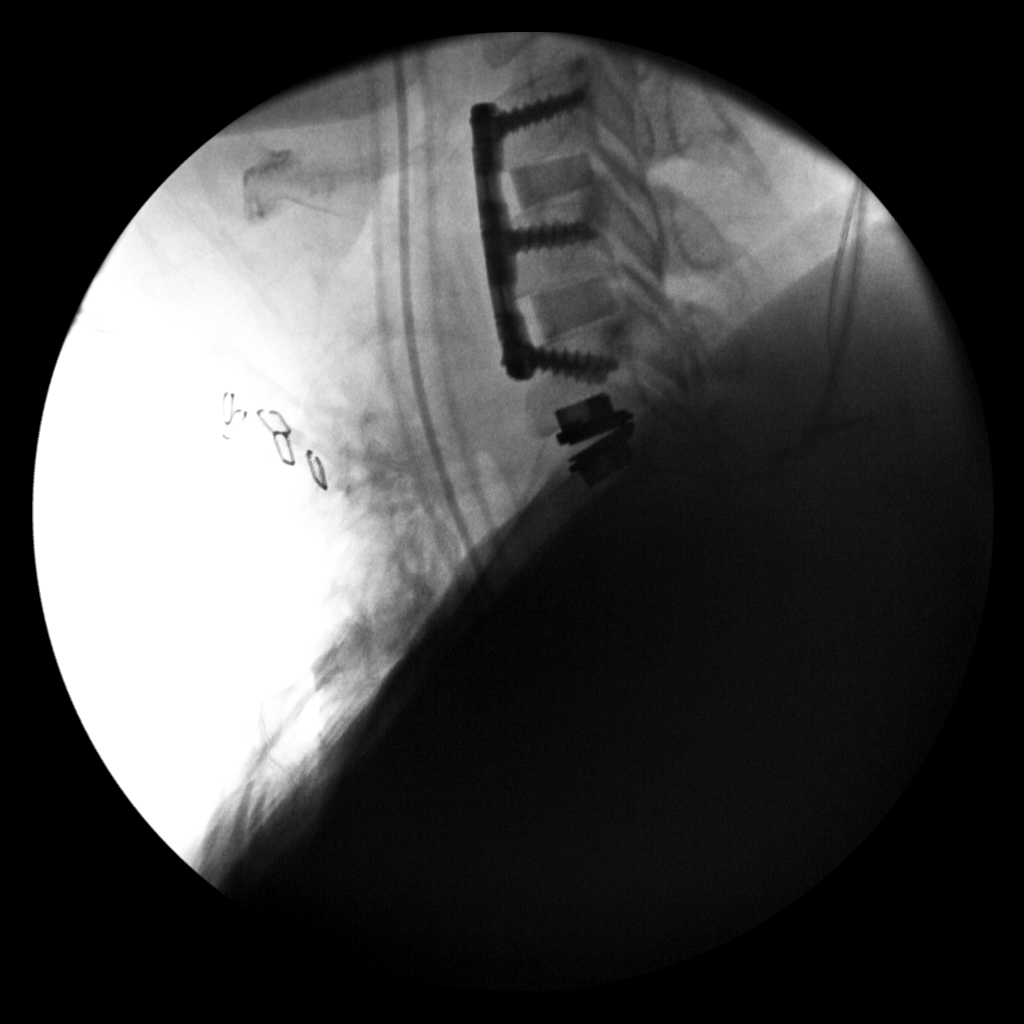
[im 3/3]
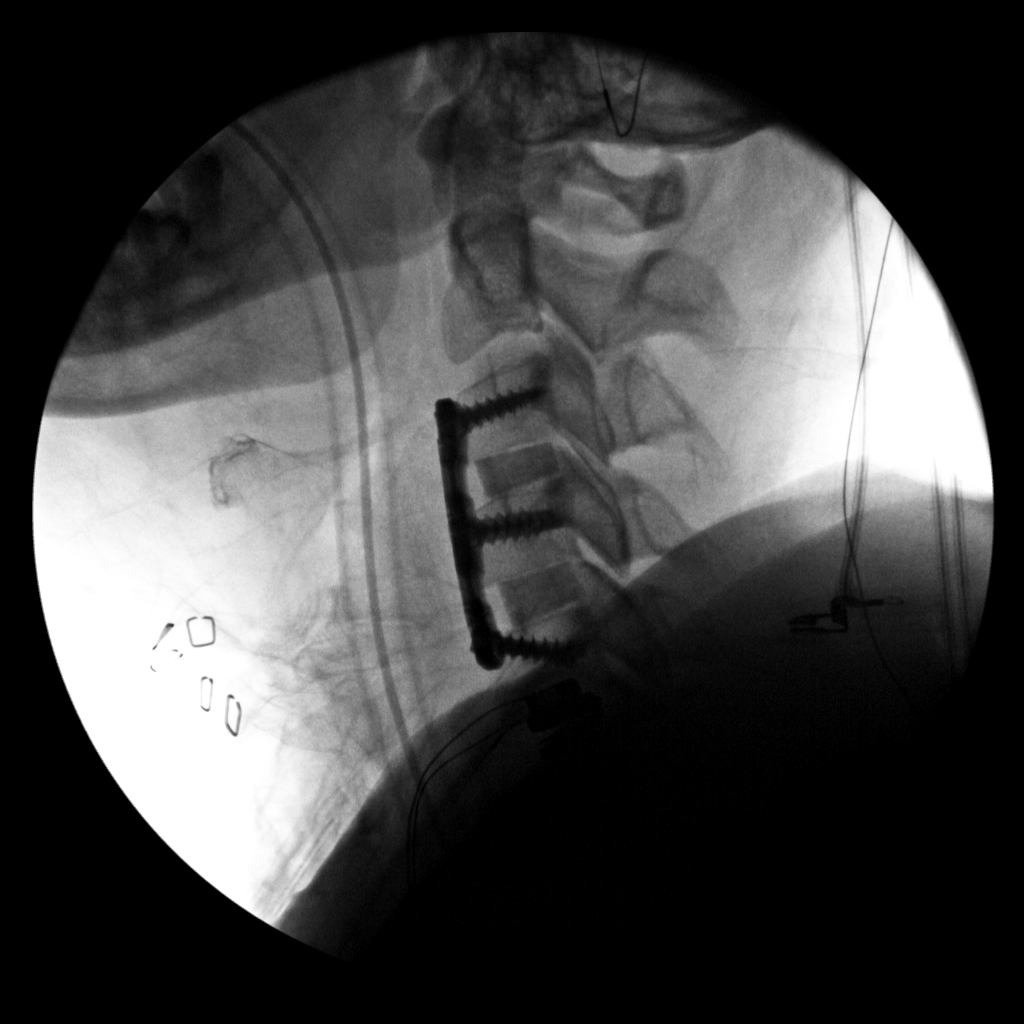

[3 of 3 positions shown; findings below may reference images not displayed]

FLUOROSCOPY TIME:  Radiation Exposure Index (as provided by the
fluoroscopic device): 60.41 mGy Kerma

C-arm fluoroscopic images were obtained intraoperatively and
submitted for post operative interpretation.
FINDINGS: Multiple intraoperative fluoroscopic images demonstrate interval
C3-C5 ACDF and C5-C6 disc replacement. No evident hardware
complication. No acute osseous abnormality.
IMPRESSION: 1. Intraoperative fluoroscopic guidance for cervical fusion and disc
replacement.

## 2022-02-07 SURGERY — ANTERIOR CERVICAL DECOMPRESSION/DISCECTOMY FUSION 1 LEVEL
Anesthesia: General

## 2022-02-07 MED ORDER — ONDANSETRON HCL 4 MG PO TABS: 4.0000 mg | ORAL_TABLET | Freq: Four times a day (QID) | ORAL | Status: DC | PRN

## 2022-02-07 MED ORDER — LACTATED RINGERS IV SOLN: INTRAVENOUS | Status: DC

## 2022-02-07 MED ORDER — DEXAMETHASONE SODIUM PHOSPHATE 10 MG/ML IJ SOLN
INTRAMUSCULAR | Status: DC | PRN
  Administered 2022-02-07: 10 mg via INTRAVENOUS

## 2022-02-07 MED ORDER — MORPHINE SULFATE (PF) 2 MG/ML IV SOLN
1.0000 mg | INTRAVENOUS | Status: DC | PRN
  Administered 2022-02-07: 1 mg via INTRAVENOUS
  Filled 2022-02-07: qty 1

## 2022-02-07 MED ORDER — BUPIVACAINE HCL 0.5 % IJ SOLN
INTRAMUSCULAR | Status: DC | PRN
  Administered 2022-02-07: 2.5 mL

## 2022-02-07 MED ORDER — MENTHOL 3 MG MT LOZG: 1.0000 | LOZENGE | OROMUCOSAL | Status: DC | PRN

## 2022-02-07 MED ORDER — OXYCODONE HCL 5 MG PO TABS: 5.0000 mg | ORAL_TABLET | Freq: Once | ORAL | Status: DC | PRN

## 2022-02-07 MED ORDER — GABAPENTIN 100 MG PO CAPS
200.0000 mg | ORAL_CAPSULE | Freq: Two times a day (BID) | ORAL | Status: DC
  Administered 2022-02-07 – 2022-02-08 (×2): 200 mg via ORAL
  Filled 2022-02-07 (×2): qty 2

## 2022-02-07 MED ORDER — OXYCODONE HCL 5 MG/5ML PO SOLN: 5.0000 mg | Freq: Once | ORAL | Status: DC | PRN

## 2022-02-07 MED ORDER — DEXAMETHASONE SODIUM PHOSPHATE 10 MG/ML IJ SOLN
8.0000 mg | Freq: Four times a day (QID) | INTRAMUSCULAR | Status: AC
  Administered 2022-02-07: 8 mg via INTRAVENOUS
  Filled 2022-02-07: qty 1

## 2022-02-07 MED ORDER — ACETAMINOPHEN 650 MG RE SUPP: 650.0000 mg | RECTAL | Status: DC | PRN

## 2022-02-07 MED ORDER — HYDROMORPHONE HCL 1 MG/ML IJ SOLN: 0.2500 mg | INTRAMUSCULAR | Status: DC | PRN

## 2022-02-07 MED ORDER — ALUM & MAG HYDROXIDE-SIMETH 200-200-20 MG/5ML PO SUSP: 30.0000 mL | Freq: Four times a day (QID) | ORAL | Status: DC | PRN

## 2022-02-07 MED ORDER — LIDOCAINE 2% (20 MG/ML) 5 ML SYRINGE
INTRAMUSCULAR | Status: DC | PRN
  Administered 2022-02-07: 60 mg via INTRAVENOUS

## 2022-02-07 MED ORDER — BUPIVACAINE LIPOSOME 1.3 % IJ SUSP
INTRAMUSCULAR | Status: AC
  Filled 2022-02-07: qty 20

## 2022-02-07 MED ORDER — FLEET ENEMA 7-19 GM/118ML RE ENEM: 1.0000 | ENEMA | Freq: Once | RECTAL | Status: DC | PRN

## 2022-02-07 MED ORDER — ONDANSETRON HCL 4 MG/2ML IJ SOLN
4.0000 mg | Freq: Once | INTRAMUSCULAR | Status: DC | PRN
Start: 2022-02-07 — End: 2022-02-07

## 2022-02-07 MED ORDER — FENTANYL CITRATE (PF) 250 MCG/5ML IJ SOLN
INTRAMUSCULAR | Status: DC | PRN
  Administered 2022-02-07 (×2): 100 ug via INTRAVENOUS

## 2022-02-07 MED ORDER — HYDROCODONE-ACETAMINOPHEN 7.5-325 MG PO TABS
1.0000 | ORAL_TABLET | ORAL | Status: DC | PRN
  Administered 2022-02-07: 1 via ORAL

## 2022-02-07 MED ORDER — THROMBIN (RECOMBINANT) 20000 UNITS EX SOLR
CUTANEOUS | Status: AC
Start: 2022-02-07 — End: ?
  Filled 2022-02-07: qty 20000

## 2022-02-07 MED ORDER — BUPIVACAINE HCL 0.5 % IJ SOLN
INTRAMUSCULAR | Status: AC
Start: 2022-02-07 — End: ?
  Filled 2022-02-07: qty 1

## 2022-02-07 MED ORDER — EPHEDRINE SULFATE-NACL 50-0.9 MG/10ML-% IV SOSY
PREFILLED_SYRINGE | INTRAVENOUS | Status: DC | PRN
  Administered 2022-02-07: 5 mg via INTRAVENOUS

## 2022-02-07 MED ORDER — CHLORHEXIDINE GLUCONATE 0.12 % MT SOLN
15.0000 mL | Freq: Once | OROMUCOSAL | Status: AC
Start: 2022-02-07 — End: 2022-02-07
  Administered 2022-02-07: 15 mL via OROMUCOSAL
  Filled 2022-02-07: qty 15

## 2022-02-07 MED ORDER — HYDROCODONE-ACETAMINOPHEN 7.5-325 MG PO TABS
1.0000 | ORAL_TABLET | Freq: Four times a day (QID) | ORAL | Status: DC
  Administered 2022-02-07 – 2022-02-08 (×4): 1 via ORAL
  Filled 2022-02-07 (×4): qty 1

## 2022-02-07 MED ORDER — SCOPOLAMINE 1 MG/3DAYS TD PT72
1.0000 | MEDICATED_PATCH | TRANSDERMAL | Status: DC
  Administered 2022-02-07: 1.5 mg via TRANSDERMAL
  Filled 2022-02-07: qty 1

## 2022-02-07 MED ORDER — SODIUM CHLORIDE 0.9% FLUSH
3.0000 mL | INTRAVENOUS | Status: DC | PRN
Start: 2022-02-07 — End: 2022-02-08

## 2022-02-07 MED ORDER — SODIUM CHLORIDE 0.9 % IV SOLN: INTRAVENOUS | Status: DC

## 2022-02-07 MED ORDER — 0.9 % SODIUM CHLORIDE (POUR BTL) OPTIME
TOPICAL | Status: DC | PRN
  Administered 2022-02-07: 1000 mL

## 2022-02-07 MED ORDER — ORAL CARE MOUTH RINSE
15.0000 mL | Freq: Once | OROMUCOSAL | Status: AC
Start: 2022-02-07 — End: 2022-02-07

## 2022-02-07 MED ORDER — SUCCINYLCHOLINE CHLORIDE 200 MG/10ML IV SOSY
PREFILLED_SYRINGE | INTRAVENOUS | Status: DC | PRN
  Administered 2022-02-07: 10 mg via INTRAVENOUS

## 2022-02-07 MED ORDER — CEFAZOLIN SODIUM-DEXTROSE 2-4 GM/100ML-% IV SOLN
2.0000 g | INTRAVENOUS | Status: AC
  Administered 2022-02-07: 2 g via INTRAVENOUS
  Filled 2022-02-07: qty 100

## 2022-02-07 MED ORDER — POLYETHYLENE GLYCOL 3350 17 G PO PACK
17.0000 g | PACK | Freq: Every day | ORAL | Status: DC | PRN
  Administered 2022-02-08: 17 g via ORAL
  Filled 2022-02-07: qty 1

## 2022-02-07 MED ORDER — PHENYLEPHRINE HCL-NACL 20-0.9 MG/250ML-% IV SOLN
INTRAVENOUS | Status: DC | PRN
  Administered 2022-02-07: 50 ug/min via INTRAVENOUS

## 2022-02-07 MED ORDER — PROPOFOL 500 MG/50ML IV EMUL
INTRAVENOUS | Status: DC | PRN
  Administered 2022-02-07: 75 ug/kg/min via INTRAVENOUS

## 2022-02-07 MED ORDER — BUPIVACAINE LIPOSOME 1.3 % IJ SUSP
10.0000 mL | Freq: Once | INTRAMUSCULAR | Status: DC
  Filled 2022-02-07: qty 10

## 2022-02-07 MED ORDER — ONDANSETRON HCL 4 MG/2ML IJ SOLN
INTRAMUSCULAR | Status: DC | PRN
  Administered 2022-02-07 (×2): 4 mg via INTRAVENOUS

## 2022-02-07 MED ORDER — ACETAMINOPHEN 325 MG PO TABS: 650.0000 mg | ORAL_TABLET | ORAL | Status: DC | PRN

## 2022-02-07 MED ORDER — PHENYLEPHRINE 80 MCG/ML (10ML) SYRINGE FOR IV PUSH (FOR BLOOD PRESSURE SUPPORT)
PREFILLED_SYRINGE | INTRAVENOUS | Status: DC | PRN
  Administered 2022-02-07: 160 ug via INTRAVENOUS

## 2022-02-07 MED ORDER — ONDANSETRON HCL 4 MG/2ML IJ SOLN
4.0000 mg | Freq: Four times a day (QID) | INTRAMUSCULAR | Status: DC | PRN
Start: 2022-02-07 — End: 2022-02-08

## 2022-02-07 MED ORDER — BISACODYL 5 MG PO TBEC: 5.0000 mg | DELAYED_RELEASE_TABLET | Freq: Every day | ORAL | Status: DC | PRN

## 2022-02-07 MED ORDER — DOCUSATE SODIUM 100 MG PO CAPS
100.0000 mg | ORAL_CAPSULE | Freq: Two times a day (BID) | ORAL | Status: DC
  Administered 2022-02-07 – 2022-02-08 (×2): 100 mg via ORAL
  Filled 2022-02-07 (×2): qty 1

## 2022-02-07 MED ORDER — PANTOPRAZOLE SODIUM 40 MG IV SOLR
40.0000 mg | Freq: Every day | INTRAVENOUS | Status: DC
  Administered 2022-02-07: 40 mg via INTRAVENOUS
  Filled 2022-02-07: qty 10

## 2022-02-07 MED ORDER — MIDAZOLAM HCL 2 MG/2ML IJ SOLN
INTRAMUSCULAR | Status: DC | PRN
  Administered 2022-02-07: 2 mg via INTRAVENOUS

## 2022-02-07 MED ORDER — EPHEDRINE 5 MG/ML INJ
INTRAVENOUS | Status: AC
Start: 2022-02-07 — End: ?
  Filled 2022-02-07: qty 5

## 2022-02-07 MED ORDER — BUPIVACAINE LIPOSOME 1.3 % IJ SUSP
INTRAMUSCULAR | Status: DC | PRN
  Administered 2022-02-07: 2.5 mL

## 2022-02-07 MED ORDER — FENTANYL CITRATE (PF) 250 MCG/5ML IJ SOLN
INTRAMUSCULAR | Status: AC
  Filled 2022-02-07: qty 5

## 2022-02-07 MED ORDER — SODIUM CHLORIDE 0.9% FLUSH
3.0000 mL | Freq: Two times a day (BID) | INTRAVENOUS | Status: DC
  Administered 2022-02-08: 3 mL via INTRAVENOUS

## 2022-02-07 MED ORDER — DEXAMETHASONE 4 MG PO TABS
4.0000 mg | ORAL_TABLET | Freq: Four times a day (QID) | ORAL | Status: AC
  Administered 2022-02-07 – 2022-02-08 (×3): 4 mg via ORAL
  Filled 2022-02-07 (×3): qty 1

## 2022-02-07 MED ORDER — MIDAZOLAM HCL 2 MG/2ML IJ SOLN
INTRAMUSCULAR | Status: AC
  Filled 2022-02-07: qty 2

## 2022-02-07 MED ORDER — THROMBIN 20000 UNITS EX SOLR
CUTANEOUS | Status: DC | PRN
  Administered 2022-02-07: 20 mL via TOPICAL

## 2022-02-07 MED ORDER — PROPOFOL 10 MG/ML IV BOLUS
INTRAVENOUS | Status: DC | PRN
  Administered 2022-02-07: 200 mg via INTRAVENOUS
  Administered 2022-02-07: 50 mg via INTRAVENOUS

## 2022-02-07 MED ORDER — HYDROCODONE-ACETAMINOPHEN 7.5-325 MG PO TABS
2.0000 | ORAL_TABLET | ORAL | Status: DC | PRN
  Filled 2022-02-07: qty 2

## 2022-02-07 MED ORDER — SODIUM CHLORIDE 0.9 % IV SOLN
250.0000 mL | INTRAVENOUS | Status: DC
  Administered 2022-02-07: 250 mL via INTRAVENOUS

## 2022-02-07 MED ORDER — CEFAZOLIN SODIUM-DEXTROSE 2-4 GM/100ML-% IV SOLN
2.0000 g | Freq: Three times a day (TID) | INTRAVENOUS | Status: AC
  Administered 2022-02-07 (×2): 2 g via INTRAVENOUS
  Filled 2022-02-07 (×2): qty 100

## 2022-02-07 MED ORDER — PHENOL 1.4 % MT LIQD: 1.0000 | OROMUCOSAL | Status: DC | PRN

## 2022-02-07 SURGICAL SUPPLY — 87 items
BAG COUNTER SPONGE SURGICOUNT (BAG) ×2 IMPLANT
BENZOIN TINCTURE PRP APPL 2/3 (GAUZE/BANDAGES/DRESSINGS) ×2 IMPLANT
BIT DRILL SRG 14X2.2XFLT CHK (BIT) IMPLANT
BIT DRL SRG 14X2.2XFLT CHK (BIT) ×1 IMPLANT
BIT MILLING PRODISC 2.0 STER (BIT) ×1 IMPLANT
BLADE CLIPPER SURG (BLADE) IMPLANT
BONE VIVIGEN FORMABLE 1.3CC (Bone Implant) ×2 IMPLANT
BUR MATCHSTICK NEURO 3.0 LAGG (BURR) ×1 IMPLANT
BUR RND FLUTED 2.5 (BURR) ×1 IMPLANT
BUR SABER RD CUTTING 3.0 (BURR) IMPLANT
CANISTER SUCT 3000ML PPV (MISCELLANEOUS) ×2 IMPLANT
CLSR STERI-STRIP ANTIMIC 1/2X4 (GAUZE/BANDAGES/DRESSINGS) ×1 IMPLANT
COLLAR CERV LO CONTOUR FIRM DE (SOFTGOODS) IMPLANT
COVER SURGICAL LIGHT HANDLE (MISCELLANEOUS) ×2 IMPLANT
DERMABOND ADVANCED (GAUZE/BANDAGES/DRESSINGS) ×1
DERMABOND ADVANCED .7 DNX12 (GAUZE/BANDAGES/DRESSINGS) ×1 IMPLANT
DISC PRODISC-C MED 5MM (Neuro Prosthesis/Implant) ×1 IMPLANT
DRAIN TLS ROUND 10FR (DRAIN) IMPLANT
DRAPE C-ARM 42X72 X-RAY (DRAPES) IMPLANT
DRAPE C-ARMOR (DRAPES) ×2 IMPLANT
DRAPE MICROSCOPE LEICA (MISCELLANEOUS) ×2 IMPLANT
DRAPE POUCH INSTRU U-SHP 10X18 (DRAPES) ×2 IMPLANT
DRAPE SURG 17X23 STRL (DRAPES) ×6 IMPLANT
DRAPE U-SHAPE 48X52 POLY STRL (PACKS) ×1 IMPLANT
DRILL BIT SKYLINE 14MM (BIT) ×1
DRSG MEPILEX BORDER 4X4 (GAUZE/BANDAGES/DRESSINGS) IMPLANT
DRSG MEPILEX BORDER 4X8 (GAUZE/BANDAGES/DRESSINGS) ×1 IMPLANT
DURAPREP 6ML APPLICATOR 50/CS (WOUND CARE) ×2 IMPLANT
ELECT BLADE 4.0 EZ CLEAN MEGAD (MISCELLANEOUS) IMPLANT
ELECT COATED BLADE 2.86 ST (ELECTRODE) ×2 IMPLANT
ELECT REM PT RETURN 9FT ADLT (ELECTROSURGICAL) ×2 IMPLANT
ELECTRODE BLDE 4.0 EZ CLN MEGD (MISCELLANEOUS) IMPLANT
ELECTRODE REM PT RTRN 9FT ADLT (ELECTROSURGICAL) ×1 IMPLANT
FEE INTRAOP CADWELL SUPPLY NCS (MISCELLANEOUS) IMPLANT
FEE INTRAOP MONITOR IMPULS NCS (MISCELLANEOUS) IMPLANT
GLOVE BIOGEL PI IND STRL 8 (GLOVE) ×1 IMPLANT
GLOVE BIOGEL PI INDICATOR 8 (GLOVE) ×1
GLOVE ECLIPSE 9.0 STRL (GLOVE) ×2 IMPLANT
GLOVE ORTHO TXT STRL SZ7.5 (GLOVE) ×2 IMPLANT
GLOVE SURG 8.5 LATEX PF (GLOVE) ×2 IMPLANT
GOWN STRL REUS W/ TWL LRG LVL3 (GOWN DISPOSABLE) ×1 IMPLANT
GOWN STRL REUS W/TWL 2XL LVL3 (GOWN DISPOSABLE) ×4 IMPLANT
GOWN STRL REUS W/TWL LRG LVL3 (GOWN DISPOSABLE) ×1
GRAFT BNE MATRIX VG FRMBL SM 1 (Bone Implant) IMPLANT
HALTER HD/CHIN CERV TRACTION D (MISCELLANEOUS) ×2 IMPLANT
INTRAOP CADWELL SUPPLY FEE NCS (MISCELLANEOUS) ×1 IMPLANT
INTRAOP DISP SUPPLY FEE NCS (MISCELLANEOUS) ×1
INTRAOP MONITOR FEE IMPULS NCS (MISCELLANEOUS) ×1 IMPLANT
INTRAOP MONITOR FEE IMPULSE (MISCELLANEOUS) ×1
KIT BASIN OR (CUSTOM PROCEDURE TRAY) ×2 IMPLANT
KIT TURNOVER KIT B (KITS) ×2 IMPLANT
NDL 27GX1/2 REG BEVEL ECLIP (NEEDLE) IMPLANT
NDL SPNL 18GX3.5 QUINCKE PK (NEEDLE) IMPLANT
NDL SPNL 20GX3.5 QUINCKE YW (NEEDLE) ×2 IMPLANT
NEEDLE 27GX1/2 REG BEVEL ECLIP (NEEDLE) ×2 IMPLANT
NEEDLE SPNL 18GX3.5 QUINCKE PK (NEEDLE) ×6 IMPLANT
NEEDLE SPNL 20GX3.5 QUINCKE YW (NEEDLE) ×4 IMPLANT
NS IRRIG 1000ML POUR BTL (IV SOLUTION) ×2 IMPLANT
PACK ORTHO CERVICAL (CUSTOM PROCEDURE TRAY) ×2 IMPLANT
PAD ARMBOARD 7.5X6 YLW CONV (MISCELLANEOUS) ×4 IMPLANT
PATTIES SURGICAL .5 X.5 (GAUZE/BANDAGES/DRESSINGS) IMPLANT
PIN BLUNT FIXATION (PIN) ×1 IMPLANT
PIN DISTRACTION 14MM (PIN) ×4 IMPLANT
PIN FIX TEMP SHARP PRODISC (PIN) ×1 IMPLANT
PIN TEMP SKYLINE THREADED (PIN) ×1 IMPLANT
PLATE SKYLINE 2 LEVEL 34MM (Plate) ×1 IMPLANT
PRO DISC RETAINER SCREW14MM (Spine Construct) ×2 IMPLANT
SCREW PRODISC RETAINER 3.5X14 (PIN) ×2 IMPLANT
SCREW VAR SELF TAP SKYLINE 14M (Screw) ×6 IMPLANT
SPACER ACF 7MM (Bone Implant) ×1 IMPLANT
SPACER ADV ACF LORODTIC 8MM (Bone Implant) ×1 IMPLANT
SPONGE INTESTINAL PEANUT (DISPOSABLE) ×1 IMPLANT
SPONGE SURGIFOAM ABS GEL 100 (HEMOSTASIS) ×2 IMPLANT
SPONGE T-LAP 4X18 ~~LOC~~+RFID (SPONGE) IMPLANT
STRIP CLOSURE SKIN 1/2X4 (GAUZE/BANDAGES/DRESSINGS) ×2 IMPLANT
SUT ETHILON 4 0 PS 2 18 (SUTURE) IMPLANT
SUT VIC AB 2-0 CT1 27 (SUTURE) ×1
SUT VIC AB 2-0 CT1 TAPERPNT 27 (SUTURE) ×1 IMPLANT
SUT VIC AB 3-0 X1 27 (SUTURE) IMPLANT
SUT VICRYL 4-0 PS2 18IN ABS (SUTURE) ×2 IMPLANT
SYR 20ML LL LF (SYRINGE) ×2 IMPLANT
SYSTEM CHEST DRAIN TLS 7FR (DRAIN) ×1 IMPLANT
TAPE CLOTH SURG 4X10 WHT LF (GAUZE/BANDAGES/DRESSINGS) ×1 IMPLANT
TIP INSERTER MEDIUM (INSTRUMENTS) ×1 IMPLANT
TOWEL GREEN STERILE (TOWEL DISPOSABLE) ×2 IMPLANT
TOWEL GREEN STERILE FF (TOWEL DISPOSABLE) ×2 IMPLANT
WATER STERILE IRR 1000ML POUR (IV SOLUTION) ×2 IMPLANT

## 2022-02-07 NOTE — Evaluation (Signed)
Clinical/Bedside Swallow Evaluation Patient Details  Name: Kristin Saunders MRN: [id number] Date of Birth: [birth year]  Today's Date: 02/07/2022 Time: SLP Start Time (ACUTE ONLY): 1425 SLP Stop Time (ACUTE ONLY): 1437 SLP Time Calculation (min) (ACUTE ONLY): 12 min  Past Medical History:  Past Medical History:  Diagnosis Date   Anxiety    Arthritis    Depression    GERD (gastroesophageal reflux disease)    Headache    Medical history non-contributory    Past Surgical History:  Past Surgical History:  Procedure Laterality Date   NO PAST SURGERIES     TUBAL LIGATION     HPI:  31 year old white female with history of C3-4, 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  PMH: GERD, arthritis    Assessment / Plan / Recommendation  Clinical Impression  Pt denied odonophagia or pressure when swallowing water and strawberry icee. Vocal quality and oral motor assessment were unremarkable. She affirms pain in her shoulders, neck and arm area s/p ACDF and arthroplasty today. There were no signs of aspiraiton or suspected pharyngeal residue, laryngeal elevation from subjective measures. Educated pt and visitor on potential short term dysphagia s/p surgery however pt's risk appears low. Pt should continue clear liquids and advance as tolerated per MD. No further ST needed at this time. SLP Visit Diagnosis: Dysphagia, unspecified (R13.10)    Aspiration Risk  No limitations    Diet Recommendation Other (Comment) (clears per MD and advance as tolerated)   Liquid Administration via: Straw;Cup Medication Administration: Whole meds with liquid Supervision: Staff to assist with self feeding    Other  Recommendations Oral Care Recommendations: Oral care BID    Recommendations for follow up therapy are one component of a multi-disciplinary discharge  planning process, led by the attending physician.  Recommendations may be updated based on patient status, additional functional criteria and insurance authorization.  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  PMH: GERD, arthritis Type of Study: Bedside Swallow Evaluation Previous Swallow Assessment:  (no) Diet Prior to this Study:  (RN stated clears since surgery advance as tolerated) Temperature Spikes Noted: No Respiratory Status: Room air History of Recent Intubation: No Behavior/Cognition: Alert;Cooperative;Pleasant mood Oral Cavity Assessment: Within Functional Limits Oral Care Completed by SLP: No Oral Cavity - Dentition: Adequate natural dentition Vision: Functional for self-feeding Self-Feeding Abilities: Total assist (until neck pain and arm strength improves) Patient Positioning: Partially reclined Baseline Vocal Quality: Normal Volitional Cough:  (limited due to neck pain) Volitional Swallow: Able to elicit    Oral/Motor/Sensory Function Overall Oral Motor/Sensory Function: Within functional limits   Ice Chips Ice chips: Not tested   Thin Liquid Thin Liquid: Within functional limits    Nectar Thick Nectar Thick Liquid: Not tested   Honey Thick Honey Thick Liquid: Not tested   Puree Puree: Within functional limits   Solid  Solid: Not tested      Royce Macadamia 02/07/2022,3:21 PM

## 2022-02-07 NOTE — Op Note (Signed)
02/07/2022  12:50 PM  PATIENT:  Kristin Saunders  31 y.o. female  MRN: [id number]  OPERATIVE REPORT  PRE-OPERATIVE DIAGNOSIS:  cervical stenosis with disc herniation C3-4 and C5-6, cervical stenosis with disc osteophytes C4-5  POST-OPERATIVE DIAGNOSIS:  cervical stenosis with disc herniation C3-4 and C5-6, cervical stenosis with disc osteophytes C4-5  PROCEDURE:  Procedure(s): ANTERIOR CERVICAL DISCECTOMY AND  FUSION C3-4,  C4-5 WITH PLATE, SCREWS, LOCAL BONE GRAFT AND VIVIGEN TOTAL CERVICAL DISC ARTHROPLASTY C5-6    SURGEON:  Jessy Oto, MD     ASSISTANT:  Benjiman Core, PA-C  (Present throughout the entire procedure and necessary for completion of procedure in a timely manner)     ANESTHESIA:  General,    COMPLICATIONS:  None.     COMPONENTS:  Implant Name Type Inv. Item Serial No. Manufacturer Lot No. LRB No. Used Action  DISC PRODISC-C MED 5MM - [id number] Neuro Prosthesis/Implant DISC PRODISC-C MED 5MM  [employer] [id number] N/A 1 Implanted  PRO DISC RETAINER SCREW89MM Spine Construct   [employer]  N/A 2 Implanted  BONE VIVIGEN FORMABLE 1.3CC - [id number] Bone Implant BONE VIVIGEN FORMABLE 1.3CC [id number] [employer]  N/A 1 Implanted  SPACER ADV ACF LORODTIC 8MM - [id number] Bone Implant SPACER ADV ACF LORODTIC 8MM [id number] [employer] FNDN  N/A 1 Implanted  SPACER ACF 7MM - [id number] Bone Implant SPACER ACF 7MM [id number] MUSCULOSKELETL TRANSPLANT FNDN  N/A 1 Implanted  PLATE SKYLINE 2 LEVEL 34MM - [id number] Plate PLATE SKYLINE 2 LEVEL 34MM  [employer]  N/A 1 Implanted  SCREW VAR SELF TAP SKYLINE 89M - [id number] Screw SCREW VAR SELF TAP SKYLINE 89M  [employer]  N/A 6 Implanted    PROCEDURE:The patient was met in the holding area, and the appropriate  left C3-4, C4-5 and C5.6 cervical level identified and marked with an "x" and my initials. The patient was then transported to OR and was placed on the operative  table in a supine position head supported on the well padded [place] horseshoe. The patient was then placed under  general anesthesia without difficulty intubated a traumaticly. A foley catheter was placed under sterile conditions.      Cervical spine was positioned supine occiput on a circular foam pad with OR green towel at the cervicothoracic junction posteriorly, head taped over forehead and at the chin and a cervical halter placed in case it is needed intraopertively over the cranial head of the bed. All pressure points well padded and semi-beach chair position.Intraoperative spinal cord monitoring with SPEP and MEP.  Standard prep with DuraPrep solution the anterior cervical spine chest. Draped in the usual manner. Iodine vi drape was used. Standard timeout protocol was carried out identifying the patient procedure side of the procedure and level. The skin the left neck was infiltrated with marcaine 0.5% 1:1 exparel 1.3% total of 8 cc. This at the level of expected C4-5 incision and also along a skin crease in line with the patients lines of Langer. Incision transverse at the C6 level and carried down to the level of the platysma. Then was carried down to the anterior aspect of the sternocleidomastoid muscle. The interval between the trachea and esophagus medially and the carotid sheath laterally was developed as a Metzenbaum scissors and blunt dissection exposing the anterior aspect of cervical spine at the C6 level. The omohyoid muscle identified and divided with bovie electrocauteryl The prevertebral fascia anterior to the cervical was cauterized with bipolar and teased across the  midline with a Art therapist. An 25-gauge small needle was placed extend into the C5-6 disc and observed on lateral radiogragh  at the C5-6 level. Handheld Cloward retraction of the soft tissues while identifying the level at C3-4, C4-5 and C5-6 using sterily draped C-arm fluoro and spinal needles with the sheaths preventing  insertion into the disc more than 1cc with identification I removed a small portion of the anterior aspect of the disc with15 blade scalpel and pituitary at C3-4, C4-5 and C5-6. Marland Kitchen Medial border of the longus collie muscles was carefully elevated bilaterally exposing the lateral aspects of the vertebral body of  C5 and C6 and at the disc space Boss McCoullough self-retaining retractors were introduced the foot of the blade beneath the medial border of the longus colli muscles. Soft tissue overlying the anterior borders of the disc space at C5-6 level carefully debridement of soft tissue back to bony edges. The anterior lip osteophytes were then resected using hand rongeur. An awl used to make an initial entry point into the lower one third of the vertebral body of C6 centered medial-lateral in the center of the vertebral body in line with the spinous processes and driving in parallel to the inferior endplate of C6. The pin confirmed with C-arm brought sterilely into the OR field with a side C-arm drape then replaced with a 14 mm screw post. This was repeated at the Upper one third of the vertebral body of C5. The retaining scaffolding then placed with minimal distraction. The disc space at C5-6 was then debrided with the sterile OR microscope. The inferior endplate of C5 and the superior endplate of C6 debrided of cartilage endplate and disc material posteriorly to the posterior longitudinal ligament. The disc space distractor for mobilizing the posterior disc was inserted using the C arm fluoro with the tips of the mobilizer on the posterior cortex of the disc space at C5-6. The disc was unable to be mobilized so that the microscope was used to assist with the removal of the posterior longitudinal ligament and disc herniation material extending to the right and centrally. With the posterior longitudinal ligament resected and small amount of posterior osteophytes at the C5-6 level were resected. Decompression was  complete and the disc and cartilage endplates resected with Tessie Eke currettes and pituitary ronguers.Under the operating room microscope the the distration of the posterior disc space was able to be completed. The trial implant was the 5 x41m implant   Depth and 15 mm wide that provide the smallest size to cover the most of the endplates at the CV0-3level with 5 mm height the smallest height that corresponded to the adjacent level disc heights.  The M 167mx 1449m 5mm59mplant spacer was then carefully placed over the intervertebral disc space at C5-6 level. Care taken to ensure that no bone or soft tissue debris within the disc space that could be retropulsed with insertion of the trial implant spacer. The implant was then impacted into place with the head placed in longitudinal cervical traction.  The implant was felt to be in excellent position alignment on the AP and lateral C-arm views. Cervical traction was removed and the cutting block for the keels of the prodisc c. The sharp pin inserted into the inferior drill opening and tapped into the vertebral body of C6. The high speed burr for drilling the keel opening into the inferior endplate of C5 then place to cortex then drilling of the keel opening made with sweeping  moves with the cutting burr. After completing the upper keel opening a blunt pin place into the upper keel slot opening and the sharp pin removed from the vertebral body of C6 and the drill for cutting the slot for the lower keel inserted. Drilling again completed with sweeping movements at the superior endplate of C6. The final chisel was used to complete the groves for the keel. The channels for the keels then clean out with the hooked groove cleaner under magnification and confirming with the Carm. The permanent implant PRODISC C [id number] mm then positioned over the disc opening and the keel opening and the arthroplasty impacted into position carefully adjusting the implant plane of  insertion on AP and lateral Intraoperative Carm fluoroscopy. After insertion the self retaining retractors and the retaining screws were removed. C-arm showed that the arthroplasty was not completely seated posteriorly so that the impactor for the arthroplasty was apply to the anterior grove in the metallic portion of the arthroplasty and the arthroplasty components tapped placing posterior portion of the prodisc-c components within the posterior one third of the disc space. The Prodisc-C appeared in good position alignment on both the AP and lateral views with C arm. Upper extremity longitudinal traction using wrist restraints was necessary to obtain visualization of the lower C6 cervical level Throughout the case. After documenting the placement of the Prodisc c components bone wax was applied to the screw post openings and the the rawbleeding anterior surfaces superficially, excess bone wax was removed and the area irrigated with copious amounts of normal saline. Attention then turned to the 2 level anterior decompression and fusion at C4-5 and C3-4.  Medial border of the longus collie muscles was carefully elevated bilaterally and self-retaining retractors were introduced the foot of the blade beneath the medial border of the longus colli muscles. Soft tissue overlying the anterior borders of the disc space at C4-5 and C3-4 level carefully debridement of soft tissue back to bony edges. The anterior lip osteophytes were then resected using rongeur. This bone was preserved for later bone grafting purposes.Distraction pins placed first at the C4-5 level.The C4-5 disc space was then first prepared using loupe magnification and headlight with resection of degenerative disc annulus anteriorly and posteriorly and cartilaginous endplates using micro-curettes pituitaries and a high-speed bur. The operating room microscope was draped sterilely and brought into the field the C-arm previously had been draped sterilely  prior to its use. Under the operating room microscope and posterior aspect of the disc was excised using micro-curettes pituitary rongeur and times per. Posterior lip osteophytes were drilled using a high-speed bur and a carefully resected with 1 and 2 mm Kerrison foraminotomy performed over both C5 nerve roots using 1 and 2 mm Kerrisons disc herniation material was noted centrally and into both foramen some of which represented reflected portion of the patient's annulus into the neuroforamen. Following decompression of the spinal cord at both C5nerve roots irrigation was carried out. The endplates of the inferior aspect of C4 and superior aspect of C5 were carefully prepared using a high-speed bur to parallel. The disc space was then sounded utilized and a precontoured lordotic sounder for the tranzgraft.  Sounding was carried up to a 8 mm implant.  8 mm spacer was felt to provide best fit for the C4-5 disc space. The 8 mm ACDF graft implant was then brought onto the field. It was then packed with local bone graft that had been harvested previously. The implant was then carefully placed over  the intervertebral disc space at C4-5 level. Care taken to ensure that no bone or soft tissue debris within the disc space that could be retropulsed with insertion of  bone graft. Thegraft   was then impacted into place with the head placed in longitudinal cervical traction.  The graft was felt to be in excellent position alignment. Distraction pin at the C5 removed. Attention then turned to the C3-4. The Boss McCollough retractor replaced at the C3-4 level. The foot of the blades medial to the longus colli muscles. A distraction screw post placed at the C3 level and distraction of the disc space performed.The C3-4 disc space was then first prepared [place] with resection of degenerative disc annulus anteriorly and posteriorly and cartilaginous endplates using micro-curettes pituitaries and a high-speed bur. The operating room  microscope  was  used. Under the operating room microscope and posterior aspect of the disc was excised using micro-curettes pituitary rongeur and times per. Posterior lip osteophytes were drilled using a high-speed bur and a carefully resected with 1 and 2 mm Kerrison foraminotomy performed over both C6 nerve roots using 1 and 2 mm Kerrisons disc herniation material was noted centrally and into the right foramen some of which represented reflected portion of the patient's annulus into the neuroforamen. Additionally there was significant spur into the right side C4 neuroforamen affecting the right C4 nerve. Following decompression of the spinal cord at both C4 nerve roots, irrigation was carried out. Bleeding controlled using some bone wax excess bone wax removed and Gelfoam thrombin-soaked. The endplates of the inferior aspect of C3 and superior aspect of C4 were carefully prepared using a high-speed bur to parallel. The disc space was then sounded utilized and a precontoured lordotic sounder for the tranzgraft.  Sounding was carried up to a 7 mm implant.  7 mm spacer was felt to provide best fit for the C3-4 disc space. The 61m ACDF allograft implant was then brought onto the field. It was then packed with local bone graft that had been harvested previously. The implant was then carefully placed over the intervertebral disc space at C3-4 level. Care taken to ensure that no bone or soft tissue debris within the disc space that could be retropulsed with insertion of the bone graft. The graft was then impacted into place with the head placed in longitudinal cervical traction.  The graft was felt to be in excellent position alignment. Distraction pins at the C3 and C4 removed. This point irrigation was carried out cervical incision site and lower cervical incision site. Esophagus examined and the upper cervical level as well as at the lower cervical level and found to be normal. Irrigation was again carried out there  was no active bleeding present.Longitudinal halter traction was discontinued. The length for cervical plate determined with cottonoid string and two bayonet forceps. Anterior lip osteophytes were then resected about the C3-4 and C4-5 levels using a high-speed bur. This area smoothed for application of the plate to the anterior surface of the cervical spine from C3-5 A 36 mm plate chosen. The plate carefully applied to the anterior cervical spine and aligned. A single retaining screw placed at the left C4 level. Drilling 14 mm hole left side at the C3 level and a 14 mm screw placed. The retaining pin was then removed and a drill hole made on the right side at C5 to 14 mm and a 14 mm screw placed. A second screw then placed on the right side at the C3 level  and on the left at the C4 level. Then both sides at C 5 level drilled to 14 mm and 14 mm screws placed. Total of 6 14 mm screws were placed each locked within the plate quite nicely. Intraoperative lateral radiograph of the cervical spine demonstrate plates and screws and bone grafts to be in good position alignment without any sign of canal impingement or retropulsion of graft. Irrigation was carried out at cervical incision site and lower cervical incision site. Esophagus examined and the upper cervical level as well as at the lower cervical level and found to be normal. The omohyoid muscle reapproximated with 2-0 vicryl. A 7 french TLS drain then placed through a separate exiting area below the skin incision. The drain placed over the anterior cervical spine at the C3-6 level and sewn in place at the skin with 4.0 nylon suture. The incisions were then closed by approximating the deep subcutaneous layers the platysma layer with interrupted 2-0 Vicryl suture and the superficial fascia overlying the sternocleidomastoid muscle with interrupted 3-0 Vicryl sutures. The subcutaneous layers were approximated with interrupte The subcutaneous layers were approximated  with interrupted 3-0 Vicryl sutures as were the superficial layers. The skin was closed with a running subcutaneous stitch of 4-0 Vicryl at the operative C6 transverse incision site. Skin was approximated with steristrips and tincture of benzoin. Mepilex bandage was applied. A [place] cervical  collar was then applied to the cervical spine. The patient then reactivated and extubated and return to the recovery PACU in satisfactory condition. Physician assistant's responsibilities: Esaw Grandchild  perform the duties of assistant surgeon during this case present from the beginning of the case to the end of the case. He assisted with careful suction of the disc areas about neural structures suctioning about her elements including cervical cord and nerve roots. He help with exposure the removal of disc and decompression of the cervicla canal and bilateral neuroforamen from his position on the opposite side of the table. His assistance was vital to completion and performing this procedure as he has more surgical experience at performing this surgery. He assisted in positioning the patient pre operatively and with the aligning of the initial retaining pins. Intraoperative neuromonitoring demonstrated no abnormal changes throughout the case.    Basil Dess 02/07/2022, 12:50 PM

## 2022-02-07 NOTE — Transfer of Care (Signed)
Immediate Anesthesia Transfer of Care Note  Patient: Kristin Saunders  Procedure(s) Performed: ANTERIOR CERVICAL DISCECTOMY AND  FUSION C3-4,  C4-5 WITH PLATE, SCREWS, LOCAL BONE GRAFT AND VIVIGEN TOTAL CERVICAL DISC ARTHROPLASTY C5-6  Patient Location: PACU  Anesthesia Type:General  Level of Consciousness: awake, alert  and oriented  Airway & Oxygen Therapy: Patient connected to nasal cannula oxygen  Post-op Assessment: Report given to RN and Post -op Vital signs reviewed and stable  Post vital signs: Reviewed and stable  Last Vitals:  Vitals Value Taken Time  BP 115/68 02/07/22 1248  Temp    Pulse 106 02/07/22 1249  Resp 21 02/07/22 1249  SpO2 95 % 02/07/22 1249  Vitals shown include unvalidated device data.  Last Pain:  Vitals:   02/07/22 0626  TempSrc:   PainSc: 8       Patients Stated Pain Goal: 2 (02/07/22 6283)  Complications: No notable events documented.

## 2022-02-07 NOTE — Brief Op Note (Signed)
02/07/2022  12:36 PM  PATIENT:  Kristin Saunders  31 y.o. female  PRE-OPERATIVE DIAGNOSIS:  cervical stenosis with disc herniation C3-4 and C5-6, cervical stenosis with disc osteophytes C4-5  POST-OPERATIVE DIAGNOSIS:  cervical stenosis with disc herniation C3-4 and C5-6, cervical stenosis with disc osteophytes C4-5  PROCEDURE:  Procedure(s): ANTERIOR CERVICAL DISCECTOMY AND  FUSION C3-4,  C4-5 WITH PLATE, SCREWS, LOCAL BONE GRAFT AND VIVIGEN (N/A) TOTAL CERVICAL DISC ARTHROPLASTY C5-6 (N/A)  SURGEON:  Surgeon(s) and Role:    * Kerrin Champagne, MD - Primary  PHYSICIAN ASSISTANT: Zonia Kief, PA-C  ANESTHESIA:   local and general , Dr. Mal Amabile  EBL:  75CC   BLOOD ADMINISTERED:none  DRAINS: Urinary Catheter (Foley) and (7 Fr) TLS Drain(s) to suction in the Left anterior cervical spine    LOCAL MEDICATIONS USED:  MARCAINE0.5% 1 :1 EXPAREL 1.3%  and Amount: 8 ml  SPECIMEN:  No Specimen  DISPOSITION OF SPECIMEN:  N/A  COUNTS:  YES  TOURNIQUET:  * No tourniquets in log *  DICTATION: .Dragon Dictation  PLAN OF CARE: Admit for overnight observation  PATIENT DISPOSITION:  PACU - hemodynamically stable.   Delay start of Pharmacological VTE agent (>24hrs) due to surgical blood loss or risk of bleeding: yes

## 2022-02-07 NOTE — Anesthesia Procedure Notes (Signed)
Procedure Name: Intubation Date/Time: 02/07/2022 7:53 AM  Performed by: Minerva Ends, CRNAPre-anesthesia Checklist: Patient identified, Emergency Drugs available, Suction available and Patient being monitored Patient Re-evaluated:Patient Re-evaluated prior to induction Oxygen Delivery Method: Circle system utilized Preoxygenation: Pre-oxygenation with 100% oxygen Induction Type: IV induction Ventilation: Mask ventilation without difficulty Laryngoscope Size: Mac, 3 and Glidescope Grade View: Grade I Tube type: Oral Tube size: 7.0 mm Number of attempts: 1 Airway Equipment and Method: Stylet and Oral airway Placement Confirmation: ETT inserted through vocal cords under direct vision, positive ETCO2 and breath sounds checked- equal and bilateral Secured at: 22 cm Tube secured with: Tape Dental Injury: Teeth and Oropharynx as per pre-operative assessment

## 2022-02-07 NOTE — H&P (Signed)
Kristin Saunders is an 31 y.o. female.   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  States that symptoms unchanged from previous visit and she is wanting to proceed with ANTERIOR CERVICAL DISCECTOMY AND  FUSION C3-4,  C4-5 WITH PLATE, SCREWS, LOCAL BONE GRAFT AND VIVIGEN and total cervical disc arthroplasty C5-6 as scheduled.  Today history and physical performed.  Review of systems negative.   Past Medical History:  Diagnosis Date   Anxiety    Arthritis    Depression    GERD (gastroesophageal reflux disease)    Headache    Medical history non-contributory     Past Surgical History:  Procedure Laterality Date   NO PAST SURGERIES     TUBAL LIGATION      Family History  Problem Relation Age of Onset   Cancer Father        COLON   Social History:  reports that she has never smoked. She has never used smokeless tobacco. She reports that she does not currently use alcohol. She reports that she does not use drugs.  Allergies: No Known Allergies  Medications Prior to Admission  Medication Sig Dispense Refill   gabapentin (NEURONTIN) 100 MG capsule Take 1 capsule (100 mg total) by mouth at bedtime. (Patient taking differently: Take 100 mg by mouth at bedtime as needed (pain).) 30 capsule 3   ibuprofen (ADVIL) 200 MG tablet Take 400 mg by mouth every 6 (six) hours as needed for moderate pain.     methocarbamol (ROBAXIN) 500 MG tablet Take 1 tablet (500 mg total) by mouth 4 (four) times daily. 30 tablet 1   Menthol-Methyl Salicylate (SALONPAS PAIN RELIEF PATCH) PTCH Apply 1 patch topically daily as needed (pain).      Results for orders placed or performed during the hospital encounter of 02/07/22 (from the past 48 hour(s))  Pregnancy, urine POC     Status: None   Collection Time: 02/07/22  6:31 AM  Result Value Ref Range   Preg Test, Ur NEGATIVE NEGATIVE    Comment:         THE SENSITIVITY OF THIS METHODOLOGY IS >24 mIU/mL    No results found.  Review of Systems  Constitutional:  Positive for activity change.  HENT: Negative.    Respiratory: Negative.    Cardiovascular: Negative.   Gastrointestinal: Negative.   Musculoskeletal:  Positive for neck pain and neck stiffness.  Neurological:  Positive for numbness.  Psychiatric/Behavioral: Negative.      Blood pressure 132/72, pulse 63, temperature 97.7 F (36.5 C), temperature source Oral, resp. rate 17, height 5\' 6"  (1.676 m), weight 117.9 kg, last menstrual period 01/02/2022, SpO2 100 %. Physical Exam HENT:     Head: Normocephalic.     Nose: Nose normal.  Eyes:     Extraocular Movements: Extraocular movements intact.  Cardiovascular:     Rate and Rhythm: Normal rate and regular rhythm.  Pulmonary:     Effort: Pulmonary effort is normal. No respiratory distress.     Breath sounds: Normal breath sounds.  Neurological:     Mental Status: She is alert and oriented to person, place, and time.  Psychiatric:        Mood and Affect: Mood normal.      Assessment/Plan C3-4, C4-5, C5-6 HNP/stenosis  We will proceed with surgery as scheduled.  Surgical procedure discussed along with potential recovery time.  All questions answered.  Zonia Kief, PA-C 02/07/2022, 7:13 AM

## 2022-02-07 NOTE — Discharge Instructions (Signed)
    No lifting greater than 10 lbs. No overhead use of arms. Avoid bending,and twisting neck. Walk in house for first week them may start to get out slowly increasing distance up to one mile by 3 weeks post op. Keep incision dry for 3 days, may then bathe and wet incision using a [place] collar when showering. Call if any fevers >101, chills, or increasing numbness or weakness or increased swelling or drainage.  Soft C-collar at all times, use a [place] collar for showering.

## 2022-02-07 NOTE — Anesthesia Postprocedure Evaluation (Signed)
Anesthesia Post Note  Patient: Kristin Saunders  Procedure(s) Performed: ANTERIOR CERVICAL DISCECTOMY AND  FUSION C3-4,  C4-5 WITH PLATE, SCREWS, LOCAL BONE GRAFT AND VIVIGEN TOTAL CERVICAL DISC ARTHROPLASTY C5-6     Patient location during evaluation: PACU Anesthesia Type: General Level of consciousness: awake and alert and oriented Pain management: pain level controlled Vital Signs Assessment: post-procedure vital signs reviewed and stable Respiratory status: spontaneous breathing, nonlabored ventilation and respiratory function stable Cardiovascular status: blood pressure returned to baseline and stable Postop Assessment: no apparent nausea or vomiting Anesthetic complications: no   No notable events documented.  Last Vitals:  Vitals:   02/07/22 1333 02/07/22 1401  BP: 116/65 120/67  Pulse: 91 83  Resp: 18 14  Temp: 36.6 C 36.8 C  SpO2: 97% 96%    Last Pain:  Vitals:   02/07/22 1401  TempSrc: Oral  PainSc:                  Chavon Lucarelli A.

## 2022-02-07 NOTE — Interval H&P Note (Signed)
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  The various methods of treatment have been discussed with the patient and family. After consideration of risks, benefits and other options for treatment, the patient has consented to  Procedure(s): ANTERIOR CERVICAL DISCECTOMY AND  FUSION C3-4,  C4-5 WITH PLATE, SCREWS, LOCAL BONE GRAFT AND VIVIGEN (N/A) TOTAL CERVICAL DISC ARTHROPLASTY C5-6 (N/A) as a surgical intervention.  The patient's history has been reviewed, patient examined, no change in status, stable for surgery.  I have reviewed the patient's chart and labs.  Questions were answered to the patient's satisfaction.     Vira Browns

## 2022-02-08 DIAGNOSIS — M4802 Spinal stenosis, cervical region: Secondary | ICD-10-CM | POA: Diagnosis not present

## 2022-02-08 LAB — BASIC METABOLIC PANEL
Anion gap: 8 (ref 5–15)
BUN: 8 mg/dL (ref 6–20)
CO2: 22 mmol/L (ref 22–32)
Calcium: 8.7 mg/dL — ABNORMAL LOW (ref 8.9–10.3)
Chloride: 111 mmol/L (ref 98–111)
Creatinine, Ser: 0.6 mg/dL (ref 0.44–1.00)
GFR, Estimated: >60 mL/min (ref >60)
Glucose, Bld: 120 mg/dL — ABNORMAL HIGH (ref 70–99)
Potassium: 4 mmol/L (ref 3.5–5.1)
Sodium: 141 mmol/L (ref 135–145)

## 2022-02-08 LAB — CBC
HCT: 41.3 % (ref 36.0–46.0)
Hemoglobin: 13.5 g/dL (ref 12.0–15.0)
MCH: 31 pg (ref 26.0–34.0)
MCHC: 32.7 g/dL (ref 30.0–36.0)
MCV: 94.9 fL (ref 80.0–100.0)
Platelets: 267 10*3/uL (ref 150–400)
RBC: 4.35 MIL/uL (ref 3.87–5.11)
RDW: 13.2 % (ref 11.5–15.5)
WBC: 10.4 10*3/uL (ref 4.0–10.5)
nRBC: 0 % (ref 0.0–0.2)

## 2022-02-08 MED ORDER — POLYETHYLENE GLYCOL 3350 17 G PO PACK: 17.0000 g | PACK | Freq: Every day | ORAL | 0 refills | Status: DC | PRN

## 2022-02-08 MED ORDER — DOCUSATE SODIUM 100 MG PO CAPS: 100.0000 mg | ORAL_CAPSULE | Freq: Two times a day (BID) | ORAL | 0 refills | Status: DC

## 2022-02-08 MED ORDER — METHYLPREDNISOLONE 4 MG PO TABS: ORAL_TABLET | ORAL | 0 refills | Status: AC

## 2022-02-08 MED ORDER — PANTOPRAZOLE SODIUM 40 MG PO TBEC: 40.0000 mg | DELAYED_RELEASE_TABLET | Freq: Every day | ORAL | Status: DC

## 2022-02-08 MED ORDER — HYDROCODONE-ACETAMINOPHEN 7.5-325 MG PO TABS: 1.0000 | ORAL_TABLET | Freq: Four times a day (QID) | ORAL | 0 refills | Status: DC | PRN

## 2022-02-08 MED ORDER — PHENOL 1.4 % MT LIQD
2.0000 | OROMUCOSAL | 0 refills | Status: DC | PRN
Start: 2022-02-08 — End: 2022-04-13

## 2022-02-08 MED FILL — Thrombin (Recombinant) For Soln 20000 Unit: CUTANEOUS | Qty: 1 | Status: AC

## 2022-02-08 NOTE — Progress Notes (Signed)
PT Cancellation Note  Patient Details Name: Kristin Saunders MRN: [id number] DOB: Sep 08, 1990   Cancelled Treatment:    Reason Eval/Treat Not Completed: PT screened, no needs identified, will sign off. OT reporting pt is IND without need for AD with no apparent balance deficits. No PT needs identified, will sign off.   Raymond Gurney, PT, DPT Acute Rehabilitation Services  Office: [phone]    Jewel Baize 02/08/2022, 10:48 AM

## 2022-02-08 NOTE — Progress Notes (Signed)
     Subjective: 1 Day Post-Op Procedure(s) (LRB): ANTERIOR CERVICAL DISCECTOMY AND  FUSION C3-4,  C4-5 WITH PLATE, SCREWS, LOCAL BONE GRAFT AND VIVIGEN (N/A) TOTAL CERVICAL DISC ARTHROPLASTY C5-6 (N/A) Awake, alert and oriented x 4. Voiding well post removal of foley. Numbness in the hands is already improved with only a small remaining area in the thumbs C5. Tolerating po nourishment and narcotics. I appreciate speech therapy's evaluation of her swallowing function.  Patient reports pain as moderate.    Objective:   VITALS:  Temp:  [97.8 F (36.6 C)-98.3 F (36.8 C)] 98.2 F (36.8 C) (06/21 1154) Pulse Rate:  [58-83] 71 (06/21 1154) Resp:  [14-18] 16 (06/21 1154) BP: (106-120)/(61-77) 106/67 (06/21 1154) SpO2:  [92 %-98 %] 93 % (06/21 1154)  Neurologically intact ABD soft Neurovascular intact Sensation intact distally Intact pulses distally Dorsiflexion/Plantar flexion intact Incision: dressing C/D/I, moderate drainage, and TLS drain suture removed and the red top tube changed with minimal drainage further about 2-3 cc. Drain then removed and dressing changed. No swelling or bleeding noted.    LABS Recent Labs    02/08/22 0535  HGB 13.5  WBC 10.4  PLT 267   Recent Labs    02/08/22 0535  NA 141  K 4.0  CL 111  CO2 22  BUN 8  CREATININE 0.60  GLUCOSE 120*   No results for input(s): "LABPT", "INR" in the last 72 hours.   Assessment/Plan: 1 Day Post-Op Procedure(s) (LRB): ANTERIOR CERVICAL DISCECTOMY AND  FUSION C3-4,  C4-5 WITH PLATE, SCREWS, LOCAL BONE GRAFT AND VIVIGEN (N/A) TOTAL CERVICAL DISC ARTHROPLASTY C5-6 (N/A)  Advance diet Up with therapy D/C IV fluids Discharge home with home health Will change to oral steroids for 2 weeks.   Vira Browns 02/08/2022, 1:50 PM Patient ID: Kristin Saunders, female   DOB: April 09, 1991, 30 y.o.   MRN: [id number]

## 2022-02-08 NOTE — Progress Notes (Signed)
Pt with orders to d/c home. Assessment and VS documented. Pt is stable. PIVs removed. Prescription sent to pharmacy pt is aware. Discharge packet and education provided to pt all questions answered. Pt and all belongings transported to private vehicle via wheelchair.

## 2022-02-08 NOTE — Evaluation (Signed)
Occupational Therapy Evaluation Patient Details Name: Kristin Saunders MRN: [id number] DOB: 1991-04-23 Today's Date: 02/08/2022   History of Present Illness Kristin Saunders  31 y.o. female who underwent ANTERIOR CERVICAL DISCECTOMY AND  FUSION C3-4,  C4-5 WITH PLATE, SCREWS, LOCAL BONE GRAFT AND VIVIGEN  TOTAL CERVICAL DISC ARTHROPLASTY C5-6 6/21. PMHx: anxiety, arthritis, depression, GERD, headache   Clinical Impression   Kristin Saunders was evaluated s/p the above neck surgery, she is indep at baseline including working at [employer] and taking care of her 2 children. She lives in an apartment with her spouse and 2 children, however she plans to d/c to her moms house initially for 24/7 assist. Upon evaluation she was mod I for all mobility and ADLs - she demonstrated great understanding of cervical precautions and use of compensatory techniques to maintain. Pt does not require further OT, will sign off. No follow up needed.      Recommendations for follow up therapy are one component of a multi-disciplinary discharge planning process, led by the attending physician.  Recommendations may be updated based on patient status, additional functional criteria and insurance authorization.   Follow Up Recommendations  No OT follow up    Assistance Recommended at Discharge Intermittent Supervision/Assistance  Patient can return home with the following Assist for transportation    Functional Status Assessment  Patient has had a recent decline in their functional status and demonstrates the ability to make significant improvements in function in a reasonable and predictable amount of time.  Equipment Recommendations  None recommended by OT       Precautions / Restrictions Precautions Precautions: Fall;Cervical Precaution Booklet Issued: No Precaution Comments: verbally recalled, pt wtih great recall from previous back sx Required Braces or Orthoses: Cervical Brace Cervical Brace: Soft  collar Restrictions Weight Bearing Restrictions: No      Mobility Bed Mobility Overal bed mobility: Needs Assistance             General bed mobility comments: OOB upon arrival, verbally recalled log roll    Transfers Overall transfer level: Needs assistance Equipment used: None                      Balance Overall balance assessment: No apparent balance deficits (not formally assessed)                                         ADL either performed or assessed with clinical judgement   ADL Overall ADL's : Modified independent               General ADL Comments: reviewed cervical precautions and compensatory techniques - pt was mod I for mobility and ADLs     Vision Baseline Vision/History: 0 No visual deficits Vision Assessment?: No apparent visual deficits            Pertinent Vitals/Pain Pain Assessment Pain Assessment: Faces Faces Pain Scale: Hurts little more Pain Location: shoulders, neck Pain Descriptors / Indicators: Discomfort Pain Intervention(s): Limited activity within patient's tolerance, Monitored during session     Hand Dominance Right   Extremity/Trunk Assessment Upper Extremity Assessment Upper Extremity Assessment: Overall WFL for tasks assessed   Lower Extremity Assessment Lower Extremity Assessment: Overall WFL for tasks assessed   Cervical / Trunk Assessment Cervical / Trunk Assessment: Neck Surgery   Communication Communication Communication: No difficulties   Cognition Arousal/Alertness: Awake/alert Behavior During Therapy: [facility] for  tasks assessed/performed Overall Cognitive Status: Within Functional Limits for tasks assessed                   General Comments  VSS on RA            Home Living Family/patient expects to be discharged to:: Private residence Living Arrangements: Spouse/significant other;Children Available Help at Discharge: Family;Available 24 hours/day Type of Home:  Apartment Home Access: Level entry     Home Layout: One level     Bathroom Shower/Tub: Producer, television/film/video: Standard     Home Equipment: Agricultural consultant (2 wheels);BSC/3in1;Shower seat   Additional Comments: plans to d/c to moms apartment with the above set up and 24/7 assist. Pt's apartment is a similar set up without the DME +2 childrena and so      Prior Functioning/Environment Prior Level of Function : Independent/Modified Independent;Working/employed               ADLs Comments: does not drive        OT Problem List:        OT Treatment/Interventions: Self-care/ADL training;Therapeutic exercise;DME and/or AE instruction;Therapeutic activities;Patient/family education;Balance training    OT Goals(Current goals can be found in the care plan section) Acute Rehab OT Goals Patient Stated Goal: home OT Goal Formulation: All assessment and education complete, DC therapy Time For Goal Achievement: 02/08/22 Potential to Achieve Goals: Good  OT Frequency: Min 2X/week       AM-PAC OT "6 Clicks" Daily Activity     Outcome Measure Help from another person eating meals?: None Help from another person taking care of personal grooming?: None Help from another person toileting, which includes using toliet, bedpan, or urinal?: None Help from another person bathing (including washing, rinsing, drying)?: None Help from another person to put on and taking off regular upper body clothing?: None Help from another person to put on and taking off regular lower body clothing?: None 6 Click Score: 24   End of Session Nurse Communication: Mobility status (pt indep)  Activity Tolerance: Patient tolerated treatment well Patient left: in chair;with call bell/phone within reach                   Time: 0943-1000 OT Time Calculation (min): 17 min Charges:  OT General Charges $OT Visit: 1 Visit OT Evaluation $OT Eval Low Complexity: 1 Low   Wilmont Olund A  Mayford Alberg 02/08/2022, 11:43 AM

## 2022-02-15 ENCOUNTER — Encounter (HOSPITAL_COMMUNITY): Payer: Self-pay | Admitting: Specialist

## 2022-02-22 ENCOUNTER — Encounter: Payer: Self-pay | Admitting: Surgery

## 2022-02-22 ENCOUNTER — Ambulatory Visit (INDEPENDENT_AMBULATORY_CARE_PROVIDER_SITE_OTHER): Payer: Medicaid Other | Admitting: Surgery

## 2022-02-22 ENCOUNTER — Ambulatory Visit: Payer: Self-pay

## 2022-02-22 VITALS — BP 94/67 | HR 82 | Ht 66.0 in | Wt 260.0 lb

## 2022-02-22 DIAGNOSIS — Z981 Arthrodesis status: Secondary | ICD-10-CM

## 2022-02-22 DIAGNOSIS — M47812 Spondylosis without myelopathy or radiculopathy, cervical region: Secondary | ICD-10-CM

## 2022-02-22 MED ORDER — HYDROCODONE-ACETAMINOPHEN 7.5-325 MG PO TABS: 1.0000 | ORAL_TABLET | Freq: Four times a day (QID) | ORAL | 0 refills | Status: DC | PRN

## 2022-02-22 NOTE — Progress Notes (Signed)
   Post-Op Visit Note   Patient: Kristin Saunders           Date of Birth: Aug 28, 1990           MRN: [id number] Visit Date: 02/22/2022 PCP: Junie Spencer, FNP   Assessment & Plan:  Chief Complaint:  Chief Complaint  Patient presents with   Neck - Routine Post Op  Patient turns.  She is 2 weeks status post C3-4 and C4-5 ACDF and C5-6 disc arthroplasty.  States that she is doing well.  Preop arm symptoms improved. Visit Diagnoses:  1. Spondylosis without myelopathy or radiculopathy, cervical region   2. S/P cervical spinal fusion     Plan: Patient advised that she must continue her cervical collar.  I discussed with her the importance of being compliant.  No driving.  I will have her follow-up with Dr. Otelia Sergeant in 4 weeks for recheck and he will likely get x-rays at that time.  Follow-Up Instructions: Return in about 4 weeks (around 03/22/2022) for WITH DR NITKA FOR 6 WEEK POSTOP CHECK. (PER Chala Gul).   Orders:  Orders Placed This Encounter  Procedures   XR Cervical Spine 2 or 3 views   No orders of the defined types were placed in this encounter.   Imaging: No results found.  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   Obstructive sleep apnea of adult 05/13/2020   Spondylolisthesis at L5-S1 level 03/11/2019   Spondylolysis 03/11/2019   Chronic bilateral low back pain with bilateral sciatica 03/11/2019   Obesity (BMI 30.0-34.9) 03/19/2018   Past Medical History:  Diagnosis Date   Anxiety    Arthritis    Depression    GERD (gastroesophageal reflux disease)    Headache    Medical history non-contributory     Family History  Problem Relation Age of Onset   Cancer Father         COLON    Past Surgical History:  Procedure Laterality Date   ANTERIOR CERVICAL DECOMP/DISCECTOMY FUSION N/A 02/07/2022   Procedure: ANTERIOR CERVICAL DISCECTOMY AND  FUSION C3-4,  C4-5 WITH PLATE, SCREWS, LOCAL BONE GRAFT AND VIVIGEN;  Surgeon: Kerrin Champagne, MD;  Location: [facility] OR;  Service: Orthopedics;  Laterality: N/A;   CERVICAL DISC ARTHROPLASTY N/A 02/07/2022   Procedure: TOTAL CERVICAL DISC ARTHROPLASTY C5-6;  Surgeon: Kerrin Champagne, MD;  Location: [facility] OR;  Service: Orthopedics;  Laterality: N/A;   NO PAST SURGERIES     TUBAL LIGATION     Social History   Occupational History   Not on file  Tobacco Use   Smoking status: Never   Smokeless tobacco: Never  Vaping Use   Vaping Use: Never used  Substance and Sexual Activity   Alcohol use: Not Currently   Drug use: No   Sexual activity: Yes    Birth control/protection: None, Surgical   Exam Surgical incision looks good.  No drainage or signs infection.  New Steri-Strips applied.

## 2022-02-27 ENCOUNTER — Other Ambulatory Visit: Payer: Self-pay

## 2022-02-27 ENCOUNTER — Emergency Department (HOSPITAL_COMMUNITY)
Admission: EM | Admit: 2022-02-27 | Discharge: 2022-02-27 | Disposition: A | Discharge status: Home / Self Care | Payer: Medicaid Other | Attending: Emergency Medicine | Admitting: Emergency Medicine

## 2022-02-27 ENCOUNTER — Emergency Department (HOSPITAL_COMMUNITY): Payer: Medicaid Other

## 2022-02-27 ENCOUNTER — Encounter (HOSPITAL_COMMUNITY): Payer: Self-pay | Admitting: Emergency Medicine

## 2022-02-27 DIAGNOSIS — W19XXXA Unspecified fall, initial encounter: Secondary | ICD-10-CM

## 2022-02-27 DIAGNOSIS — M542 Cervicalgia: Secondary | ICD-10-CM | POA: Insufficient documentation

## 2022-02-27 DIAGNOSIS — W010XXA Fall on same level from slipping, tripping and stumbling without subsequent striking against object, initial encounter: Secondary | ICD-10-CM | POA: Diagnosis not present

## 2022-02-27 NOTE — ED Triage Notes (Signed)
Patient tripped and fell forward yesterday afternoon , denies LOC/ambulatory, reports right lateral neck pain and tingling at right forearm with emesis x3 this morning .

## 2022-02-27 NOTE — ED Provider Notes (Signed)
[facility] EMERGENCY DEPARTMENT Provider Note   CSN: [id number] Arrival date & time: 02/27/22  0544     History  Chief Complaint  Patient presents with   Marletta Lor    Kristin Saunders is a 31 y.o. female.  Pt reports she tripped and fell forward yesterday hitting her hands and knees.  Pt complains of soreness in her hands and knees but also worried about her neck.  Pt had a spinal fusion on 6/20 by Dr. Otelia Sergeant and is worried she could have injured something falling   The history is provided by the patient. No language interpreter was used.  Fall This is a new problem. The current episode started yesterday. The problem occurs constantly. The problem has not changed since onset.Nothing aggravates the symptoms. Nothing relieves the symptoms. She has tried nothing for the symptoms. The treatment provided no relief.       Home Medications Prior to Admission medications   Medication Sig Start Date End Date Taking? Authorizing Provider  docusate sodium (COLACE) 100 MG capsule Take 1 capsule (100 mg total) by mouth 2 (two) times daily. 02/08/22   Kerrin Champagne, MD  gabapentin (NEURONTIN) 100 MG capsule Take 1 capsule (100 mg total) by mouth at bedtime. Patient taking differently: Take 100 mg by mouth at bedtime as needed (pain). 06/10/21   Kerrin Champagne, MD  HYDROcodone-acetaminophen (NORCO) 7.5-325 MG tablet Take 1-2 tablets by mouth every 6 (six) hours as needed for severe pain ((score 7 to 10)). 02/22/22   Naida Sleight, PA-C  Menthol-Methyl Salicylate (SALONPAS PAIN RELIEF PATCH) PTCH Apply 1 patch topically daily as needed (pain).    [provider]  methocarbamol (ROBAXIN) 500 MG tablet Take 1 tablet (500 mg total) by mouth 4 (four) times daily. 10/17/21   Daryll Drown, NP  methylPREDNISolone (MEDROL) 4 MG tablet Take 1 tablet (4 mg total) by mouth daily for 14 days, THEN 0.5 tablets (2 mg total) daily for 14 days. 02/08/22 03/08/22  Kerrin Champagne, MD  phenol  (CHLORASEPTIC) 1.4 % LIQD Use as directed 2 sprays in the mouth or throat as needed for throat irritation / pain. 02/08/22   Kerrin Champagne, MD  polyethylene glycol (MIRALAX / GLYCOLAX) 17 g packet Take 17 g by mouth daily as needed for mild constipation. 02/08/22   Kerrin Champagne, MD      Allergies    Patient has no known allergies.    Review of Systems   Review of Systems  Musculoskeletal:  Positive for neck pain.  All other systems reviewed and are negative.   Physical Exam Updated Vital Signs BP 118/74   Pulse 73   Temp 98.2 F (36.8 C) (Oral)   Resp 14   LMP 01/02/2022 (Approximate)   SpO2 100%  Physical Exam Vitals reviewed.  HENT:     Head: Normocephalic.     Nose: Nose normal.     Mouth/Throat:     Mouth: Mucous membranes are moist.  Eyes:     Extraocular Movements: Extraocular movements intact.     Pupils: Pupils are equal, round, and reactive to light.  Neck:     Comments: Tender diffuse neck,  good range of motion nv and ns intact  Cardiovascular:     Rate and Rhythm: Normal rate and regular rhythm.  Pulmonary:     Effort: Pulmonary effort is normal.  Abdominal:     General: Abdomen is flat.  Musculoskeletal:  General: No swelling.     Cervical back: Normal range of motion. Tenderness present.  Skin:    General: Skin is warm.  Neurological:     General: No focal deficit present.  Psychiatric:        Mood and Affect: Mood normal.     ED Results / Procedures / Treatments   Labs (all labs ordered are listed, but only abnormal results are displayed) Labs Reviewed - No data to display  EKG None  Radiology CT Cervical Spine Wo Contrast  Result Date: 02/27/2022 CLINICAL DATA:  Neck trauma (Age >= 65y) patient tripped and fell forward yesterday afternoon, presenting with right lateral neck pain and tingling in the right forearm with emesis x3 this morning EXAM: CT CERVICAL SPINE WITHOUT CONTRAST TECHNIQUE: Multidetector CT imaging of the cervical  spine was performed without intravenous contrast. Multiplanar CT image reconstructions were also generated. RADIATION DOSE REDUCTION: This exam was performed according to the departmental dose-optimization program which includes automated exposure control, adjustment of the mA and/or kV according to patient size and/or use of iterative reconstruction technique. COMPARISON:  MRI of the cervical spine dated December 02, 2021 FINDINGS: Alignment: There is straightening (absence of the normal lordotic curvature) of the cervical spine seen. Skull base and vertebrae: There has been interval cervical fusion from C3 through C5 with intervertebral disc cages at C3-C4 through C5-C6. No acute fracture. No focal pathologic process. Mild-to-moderate cervical spondylosis with prominent posterior osteophytosis at C4-C5 likely abutting the ventral cervical cord. Soft tissues and spinal canal: No prevertebral fluid or swelling. No visible canal hematoma. Disc levels:  Intervertebral disc cages as above. Upper chest: Negative. Other: None. IMPRESSION: There has been interval cervical fusion from C3 through C5 with intervertebral disc cages from C3-C4 through C5-C6. No acute fracture or dislocation. Cervical spondylosis most prominent at C4-C5 with prominent posterior marginal osteophytes. Electronically Signed   By: Marjo Bicker M.D.   On: 02/27/2022 07:54    Procedures Procedures    Medications Ordered in ED Medications - No data to display  ED Course/ Medical Decision Making/ A&P                           Medical Decision Making Amount and/or Complexity of Data Reviewed Radiology: ordered.           Final Clinical Impression(s) / ED Diagnoses Final diagnoses:  Fall, initial encounter  Neck pain    Rx / DC Orders ED Discharge Orders     None      An After Visit Summary was printed and given to the patient.    Elson Areas, [place] 02/27/22 3532    Vanetta Mulders, MD 02/27/22 1616

## 2022-02-27 NOTE — Discharge Instructions (Addendum)
Follow up with your Physician for recheck  

## 2022-02-27 NOTE — ED Notes (Signed)
Patient verbalizes understanding of discharge instructions. Opportunity for questioning and answers were provided. Armband removed by staff, pt discharged from ED and ambulated to lobby to return home.   

## 2022-03-01 NOTE — Discharge Summary (Signed)
Patient ID: Stephnie Parlier MRN: [id number] DOB/AGE: [birth year] 31 y.o.  Admit date: 02/07/2022 Discharge date: 02/08/2022  Admission Diagnoses:  Principal Problem:   Stenosis of cervical spine with myelopathy (HCC) Active Problems:   S/P cervical spinal fusion   Discharge Diagnoses:  Principal Problem:   Stenosis of cervical spine with myelopathy (HCC) Active Problems:   S/P cervical spinal fusion  status post Procedure(s): ANTERIOR CERVICAL DISCECTOMY AND  FUSION C3-4,  C4-5 WITH PLATE, SCREWS, LOCAL BONE GRAFT AND VIVIGEN TOTAL CERVICAL DISC ARTHROPLASTY C5-6  Past Medical History:  Diagnosis Date   Anxiety    Arthritis    Depression    GERD (gastroesophageal reflux disease)    Headache    Medical history non-contributory     Surgeries: Procedure(s): ANTERIOR CERVICAL DISCECTOMY AND  FUSION C3-4,  C4-5 WITH PLATE, SCREWS, LOCAL BONE GRAFT AND VIVIGEN TOTAL CERVICAL DISC ARTHROPLASTY C5-6 on 02/07/2022   Consultants:   Discharged Condition: Improved  Hospital Course: Dmiyah Liscano is an 31 y.o. female who was admitted 02/07/2022 for operative treatment of Stenosis of cervical spine with myelopathy (HCC). Patient failed conservative treatments (please see the history and physical for the specifics) and had severe unremitting pain that affects sleep, daily activities and work/hobbies. After pre-op clearance, the patient was taken to the operating room on 02/07/2022 and underwent  Procedure(s): ANTERIOR CERVICAL DISCECTOMY AND  FUSION C3-4,  C4-5 WITH PLATE, SCREWS, LOCAL BONE GRAFT AND VIVIGEN TOTAL CERVICAL DISC ARTHROPLASTY C5-6.    Patient was given perioperative antibiotics:  Anti-infectives (From admission, onward)    Start     Dose/Rate Route Frequency Ordered Stop   02/07/22 1500  ceFAZolin (ANCEF) IVPB 2g/100 mL premix        2 g 200 mL/hr over 30 Minutes Intravenous Every 8 hours 02/07/22 1401 02/08/22 1414   02/07/22 0615  ceFAZolin (ANCEF) IVPB 2g/100 mL  premix        2 g 200 mL/hr over 30 Minutes Intravenous On call to O.R. 02/07/22 0601 02/07/22 0805        Patient was given sequential compression devices and early ambulation to prevent DVT.   Patient benefited maximally from hospital stay and there were no complications. At the time of discharge, the patient was urinating/moving their bowels without difficulty, tolerating a regular diet, pain is controlled with oral pain medications and they have been cleared by PT/OT.   Recent vital signs: No data found.   Recent laboratory studies: No results for input(s): "WBC", "HGB", "HCT", "PLT", "NA", "K", "CL", "CO2", "BUN", "CREATININE", "GLUCOSE", "INR", "CALCIUM" in the last 72 hours.  Invalid input(s): "PT", "2"   Discharge Medications:   Allergies as of 02/08/2022   No Known Allergies      Medication List     STOP taking these medications    ibuprofen 200 MG tablet Commonly known as: ADVIL       TAKE these medications    docusate sodium 100 MG capsule Commonly known as: COLACE Take 1 capsule (100 mg total) by mouth 2 (two) times daily.   gabapentin 100 MG capsule Commonly known as: NEURONTIN Take 1 capsule (100 mg total) by mouth at bedtime. What changed:  when to take this reasons to take this   methocarbamol 500 MG tablet Commonly known as: ROBAXIN Take 1 tablet (500 mg total) by mouth 4 (four) times daily.   methylPREDNISolone 4 MG tablet Commonly known as: Medrol Take 1 tablet (4 mg total) by mouth daily for 14 days, THEN  0.5 tablets (2 mg total) daily for 14 days. Start taking on: February 08, 2022   phenol 1.4 % Liqd Commonly known as: CHLORASEPTIC Use as directed 2 sprays in the mouth or throat as needed for throat irritation / pain.   polyethylene glycol 17 g packet Commonly known as: MIRALAX / GLYCOLAX Take 17 g by mouth daily as needed for mild constipation.   Salonpas Pain Relief Patch Ptch Apply 1 patch topically daily as needed (pain).         Diagnostic Studies: CT Cervical Spine Wo Contrast  Result Date: 02/27/2022 CLINICAL DATA:  Neck trauma (Age >= 65y) patient tripped and fell forward yesterday afternoon, presenting with right lateral neck pain and tingling in the right forearm with emesis x3 this morning EXAM: CT CERVICAL SPINE WITHOUT CONTRAST TECHNIQUE: Multidetector CT imaging of the cervical spine was performed without intravenous contrast. Multiplanar CT image reconstructions were also generated. RADIATION DOSE REDUCTION: This exam was performed according to the departmental dose-optimization program which includes automated exposure control, adjustment of the mA and/or kV according to patient size and/or use of iterative reconstruction technique. COMPARISON:  MRI of the cervical spine dated December 02, 2021 FINDINGS: Alignment: There is straightening (absence of the normal lordotic curvature) of the cervical spine seen. Skull base and vertebrae: There has been interval cervical fusion from C3 through C5 with intervertebral disc cages at C3-C4 through C5-C6. No acute fracture. No focal pathologic process. Mild-to-moderate cervical spondylosis with prominent posterior osteophytosis at C4-C5 likely abutting the ventral cervical cord. Soft tissues and spinal canal: No prevertebral fluid or swelling. No visible canal hematoma. Disc levels:  Intervertebral disc cages as above. Upper chest: Negative. Other: None. IMPRESSION: There has been interval cervical fusion from C3 through C5 with intervertebral disc cages from C3-C4 through C5-C6. No acute fracture or dislocation. Cervical spondylosis most prominent at C4-C5 with prominent posterior marginal osteophytes. Electronically Signed   By: Marjo Bicker M.D.   On: 02/27/2022 07:54   DG Cervical Spine 2 or 3 views  Result Date: 02/07/2022 CLINICAL DATA:  Cervical fusion. EXAM: CERVICAL SPINE - 2-3 VIEW COMPARISON:  MRI cervical spine dated December 02, 2021. FLUOROSCOPY TIME:  Radiation  Exposure Index (as provided by the fluoroscopic device): 60.41 mGy Kerma C-arm fluoroscopic images were obtained intraoperatively and submitted for post operative interpretation. FINDINGS: Multiple intraoperative fluoroscopic images demonstrate interval C3-C5 ACDF and C5-C6 disc replacement. No evident hardware complication. No acute osseous abnormality. IMPRESSION: 1. Intraoperative fluoroscopic guidance for cervical fusion and disc replacement. Electronically Signed   By: Obie Dredge M.D.   On: 02/07/2022 12:16   DG C-Arm 1-60 Min-No Report  Result Date: 02/07/2022 Fluoroscopy was utilized by the requesting physician.  No radiographic interpretation.   DG C-Arm 1-60 Min-No Report  Result Date: 02/07/2022 Fluoroscopy was utilized by the requesting physician.  No radiographic interpretation.   DG C-Arm 1-60 Min-No Report  Result Date: 02/07/2022 Fluoroscopy was utilized by the requesting physician.  No radiographic interpretation.    Discharge Instructions     Call MD / Call 911   Complete by: As directed    If you experience chest pain or shortness of breath, CALL 911 and be transported to the hospital emergency room.  If you develope a fever above 101 F, pus (white drainage) or increased drainage or redness at the wound, or calf pain, call your surgeon's office.   Constipation Prevention   Complete by: As directed    Drink plenty of fluids.  Prune  juice may be helpful.  You may use a stool softener, such as Colace (over the counter) 100 mg twice a day.  Use MiraLax (over the counter) for constipation as needed.   Diet - low sodium heart healthy   Complete by: As directed    Discharge instructions   Complete by: As directed    No lifting greater than 10 lbs. No overhead use of arms. Avoid bending,and twisting neck. Walk in house for first week them may start to get out slowly increasing distance up to one mile by 3 weeks post op. Keep incision dry for 3 days, may then bathe and wet  incision using a [place] collar when showering. Call if any fevers >101, chills, or increasing numbness or weakness or increased swelling or drainage.  Soft C-collar at all times, use a [place] collar for showering.   Driving restrictions   Complete by: As directed    No driving for 6 weeks   Increase activity slowly as tolerated   Complete by: As directed    Lifting restrictions   Complete by: As directed    No lifting for 8 weeks   Post-operative opioid taper instructions:   Complete by: As directed    POST-OPERATIVE OPIOID TAPER INSTRUCTIONS: It is important to wean off of your opioid medication as soon as possible. If you do not need pain medication after your surgery it is ok to stop day one. Opioids include: Codeine, Hydrocodone(Norco, Vicodin), Oxycodone(Percocet, oxycontin) and hydromorphone amongst others.  Long term and even short term use of opiods can cause: Increased pain response Dependence Constipation Depression Respiratory depression And more.  Withdrawal symptoms can include Flu like symptoms Nausea, vomiting And more Techniques to manage these symptoms Hydrate well Eat regular healthy meals Stay active Use relaxation techniques(deep breathing, meditating, yoga) Do Not substitute Alcohol to help with tapering If you have been on opioids for less than two weeks and do not have pain than it is ok to stop all together.  Plan to wean off of opioids This plan should start within one week post op of your joint replacement. Maintain the same interval or time between taking each dose and first decrease the dose.  Cut the total daily intake of opioids by one tablet each day Next start to increase the time between doses. The last dose that should be eliminated is the evening dose.           Follow-up Information     Kerrin Champagne, MD Follow up in 2 week(s).   Specialty: Orthopedic Surgery Why: For wound re-check Contact information: [address] [place] [address] [phone]                 Discharge Plan:  discharge to home  Disposition:     Signed: Zonia Kief  03/01/2022, 3:12 PM

## 2022-03-17 ENCOUNTER — Other Ambulatory Visit: Payer: Self-pay | Admitting: Surgery

## 2022-03-17 NOTE — Telephone Encounter (Signed)
Patient called. She would like a refill on hydrocodone. Her call back number is [phone]

## 2022-03-18 ENCOUNTER — Other Ambulatory Visit: Payer: Self-pay | Admitting: Specialist

## 2022-03-18 MED ORDER — HYDROCODONE-ACETAMINOPHEN 7.5-325 MG PO TABS: 1.0000 | ORAL_TABLET | Freq: Four times a day (QID) | ORAL | 0 refills | Status: DC | PRN

## 2022-03-23 ENCOUNTER — Encounter: Payer: Self-pay | Admitting: Specialist

## 2022-03-23 ENCOUNTER — Ambulatory Visit (INDEPENDENT_AMBULATORY_CARE_PROVIDER_SITE_OTHER): Payer: Medicaid Other | Admitting: Specialist

## 2022-03-23 ENCOUNTER — Ambulatory Visit: Payer: Self-pay

## 2022-03-23 VITALS — BP 107/77 | HR 81 | Ht 66.0 in | Wt 260.0 lb

## 2022-03-23 DIAGNOSIS — Z981 Arthrodesis status: Secondary | ICD-10-CM

## 2022-03-23 NOTE — Patient Instructions (Signed)
Avoid overhead lifting and overhead use of the arms. Do not lift greater than 5 lbs. Adjust head rest in vehicle to prevent hyperextension if rear ended. Take extra precautions to avoid falling.  

## 2022-03-23 NOTE — Progress Notes (Signed)
   Post-Op Visit Note   Patient: Kristin Saunders           Date of Birth: 1990/09/19           MRN: [id number] Visit Date: 03/23/2022 PCP: Junie Spencer, FNP   Assessment & Plan: 6 weeks post op ACDF C3-4 and C4-5 with total disc arthroplasty C5-6, fell post op day 15  Chief Complaint: No chief complaint on file. No arm pain numbness or weakness Normal strength "When can I return to work?" Incision is healed with some minimal erythrema expected  Taking intermittant pain meds at every 8hrs. Visit Diagnoses:  1. S/P cervical spinal fusion     Plan: Avoid overhead lifting and overhead use of the arms. Do not lift greater than 5 lbs. Adjust head rest in vehicle to prevent hyperextension if rear ended. Take extra precautions to avoid falling.  Follow-Up Instructions: No follow-ups on file.   Orders:  Orders Placed This Encounter  Procedures   XR Cervical Spine 2 or 3 views   No orders of the defined types were placed in this encounter.   Imaging: No results found.  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   Past Medical History:  Diagnosis Date   Anxiety    Arthritis    Depression    GERD (gastroesophageal reflux disease)    Headache    Medical history non-contributory     Family History  Problem  Relation Age of Onset   Cancer Father        COLON    Past Surgical History:  Procedure Laterality Date   ANTERIOR CERVICAL DECOMP/DISCECTOMY FUSION N/A 02/07/2022   Procedure: ANTERIOR CERVICAL DISCECTOMY AND  FUSION C3-4,  C4-5 WITH PLATE, SCREWS, LOCAL BONE GRAFT AND VIVIGEN;  Surgeon: Kerrin Champagne, MD;  Location: [facility] OR;  Service: Orthopedics;  Laterality: N/A;   CERVICAL DISC ARTHROPLASTY N/A 02/07/2022   Procedure: TOTAL CERVICAL DISC ARTHROPLASTY C5-6;  Surgeon: Kerrin Champagne, MD;  Location: [facility] OR;  Service: Orthopedics;  Laterality: N/A;   NO PAST SURGERIES     TUBAL LIGATION     Social History   Occupational History   Not on file  Tobacco Use   Smoking status: Never   Smokeless tobacco: Never  Vaping Use   Vaping Use: Never used  Substance and Sexual Activity   Alcohol use: Not Currently   Drug use: No   Sexual activity: Yes    Birth control/protection: None, Surgical

## 2022-04-13 ENCOUNTER — Encounter: Payer: Self-pay | Admitting: Family Medicine

## 2022-04-13 ENCOUNTER — Ambulatory Visit: Payer: Medicaid Other | Admitting: Family Medicine

## 2022-04-13 VITALS — BP 100/70 | HR 77 | Temp 98.3°F | Ht 66.0 in | Wt 219.2 lb

## 2022-04-13 DIAGNOSIS — M79672 Pain in left foot: Secondary | ICD-10-CM

## 2022-04-13 DIAGNOSIS — M79604 Pain in right leg: Secondary | ICD-10-CM | POA: Diagnosis not present

## 2022-04-13 MED ORDER — MELOXICAM 15 MG PO TABS: 15.0000 mg | ORAL_TABLET | Freq: Every day | ORAL | 0 refills | Status: DC

## 2022-04-13 NOTE — Progress Notes (Signed)
   Acute Office Visit  Subjective:     Patient ID: Kristin Saunders, female    DOB: 1991-08-18, 31 y.o.   MRN: [id number]  Chief Complaint  Patient presents with   Foot Pain   Leg Pain    Foot Pain The current episode started more than 1 month ago. The problem has been unchanged. Pertinent negatives include no fever, joint swelling, numbness or rash. The symptoms are aggravated by standing and walking. She has tried acetaminophen and NSAIDs for the symptoms. The treatment provided mild relief.  Leg Pain  The incident occurred 12 to 24 hours ago. The injury mechanism was a fall. Pain location: right calf. The quality of the pain is described as cramping and aching. The pain has been Fluctuating since onset. Pertinent negatives include no inability to bear weight, loss of motion, loss of sensation, muscle weakness, numbness or tingling. She reports no foreign bodies present. The symptoms are aggravated by weight bearing. She has tried NSAIDs for the symptoms. The treatment provided no relief.    Review of Systems  Constitutional:  Negative for fever.  Cardiovascular:  Negative for leg swelling.  Musculoskeletal:  Negative for joint swelling.  Skin:  Negative for rash.  Neurological:  Negative for tingling and numbness.        Objective:    BP 100/70   Pulse 77   Temp 98.3 F (36.8 C) (Temporal)   Ht 5\' 6"  (1.676 m)   Wt 219 lb 4 oz (99.5 kg)   SpO2 100%   BMI 35.39 kg/m    Physical Exam Vitals and nursing note reviewed.  Constitutional:      General: She is not in acute distress.    Appearance: She is not ill-appearing, toxic-appearing or diaphoretic.  Musculoskeletal:     Right lower leg: Normal. No swelling, tenderness or bony tenderness. No edema.     Left ankle: Normal.     Left Achilles Tendon: Normal.     Left foot: Normal range of motion and normal capillary refill. Bony tenderness (5th metacarapal base, lateral aspect) present. No swelling, deformity or bunion.  Normal pulse.  Skin:    General: Skin is warm and dry.  Neurological:     Mental Status: She is alert and oriented to person, place, and time.  Psychiatric:        Mood and Affect: Mood normal.        Behavior: Behavior normal.     No results found for any visits on 04/13/22.      Assessment & Plan:   Eisa was seen today for foot pain and leg pain.  Diagnoses and all orders for this visit:  Right leg pain Discussed muscle strain. Mobic for pain/inflammation. Discussed RICE therapy.  -     meloxicam (MOBIC) 15 MG tablet; Take 1 tablet (15 mg total) by mouth daily.  Left foot pain Xray pending, will notify patient of results and plan of care pending report. Discussed mobic, RICE therapy for now.  -     DG Foot Complete Left; Future  Return to office for new or worsening symptoms, or if symptoms persist.   The patient indicates understanding of these issues and agrees with the plan.   Valli Glance, FNP

## 2022-04-14 ENCOUNTER — Encounter: Payer: Self-pay | Admitting: Family Medicine

## 2022-04-17 ENCOUNTER — Other Ambulatory Visit (INDEPENDENT_AMBULATORY_CARE_PROVIDER_SITE_OTHER): Payer: Medicaid Other

## 2022-04-17 DIAGNOSIS — M79672 Pain in left foot: Secondary | ICD-10-CM | POA: Diagnosis not present

## 2022-04-27 ENCOUNTER — Encounter: Payer: Self-pay | Admitting: Specialist

## 2022-04-27 ENCOUNTER — Ambulatory Visit: Payer: Medicaid Other | Admitting: Specialist

## 2022-04-27 ENCOUNTER — Ambulatory Visit (INDEPENDENT_AMBULATORY_CARE_PROVIDER_SITE_OTHER): Payer: Medicaid Other

## 2022-04-27 VITALS — BP 108/76 | HR 73 | Ht 66.0 in | Wt 219.0 lb

## 2022-04-27 DIAGNOSIS — Z981 Arthrodesis status: Secondary | ICD-10-CM | POA: Diagnosis not present

## 2022-04-27 NOTE — Progress Notes (Signed)
Post-Op Visit Note   Patient: Kristin Saunders           Date of Birth: [birth year]           MRN: [id number] Visit Date: 04/27/2022 PCP: Junie Spencer, FNP   Assessment & Plan: 11 weeks post op cervical fusion  Chief Complaint:  Chief Complaint  Patient presents with   Neck - Routine Post Op   Visit Diagnoses:  1. S/P cervical spinal fusion   She is not having much pain,  a little bit at night but it stops. Not taking narcotics, does take some  Muscle relaxant when she returns from work. 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 Motor is normal both arms Reflexes are intact 1+ left biceps c/w 2+ right side Knee 3+ bilaterally, no clonus. No spasticity. Radiographs AP and lateral flexion and extension radiographs cervical spine. AP and lateral flexion and extension radiographs of the cervical spine demonstrates anterior fusion C3-4 and C4-5 with plate and screws in good position and alignment, with flexion and extension there is 1.83mm of difference in measurements across similar points over the posterior elements. No soft tissue swelling noted. The artificial disc  C5-6 is in good position and alignment, the artificial disc is moving normally in flexion and extension with minimal Soft tissue calcification over the anterior disc space. Fusion is occurring normally and the artificial disc is well maintained with no sign of loosening.   Plan: Avoid frequent bending and stooping  No lifting greater than 25 lbs. May use ice or moist heat for pain. Weight loss is of benefit. Best medication for lumbar disc disease is arthritis medications like motrin, celebrex and naprosyn. Exercise is important to improve your indurance and does allow people to function better inspite of back pain.  Avoid overhead lifting and overhead use of the arms. Do not lift greater than 25 lbs. Adjust head rest in vehicle to prevent hyperextension if rear ended. Take extra precautions to  avoid falling. Return for follow up with me in 5 weeks then with Dr. Christell Constant, the new spine specialist in 3 months following that.  Follow-Up Instructions: No follow-ups on file.   Orders:  Orders Placed This Encounter  Procedures   XR Cervical Spine 2 or 3 views   No orders of the defined types were placed in this encounter.   Imaging: No results found.  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   Obstructive sleep apnea of adult 05/13/2020   Obesity (BMI 30.0-34.9) 03/19/2018   Past Medical History:  Diagnosis Date   Anxiety    Arthritis    Depression    GERD (gastroesophageal reflux disease)    Headache    Medical history non-contributory     Family History  Problem Relation Age of Onset   Cancer Father        COLON    Past Surgical History:  Procedure Laterality Date  ANTERIOR CERVICAL DECOMP/DISCECTOMY FUSION N/A 02/07/2022   Procedure: ANTERIOR CERVICAL DISCECTOMY AND  FUSION C3-4,  C4-5 WITH PLATE, SCREWS, LOCAL BONE GRAFT AND VIVIGEN;  Surgeon: Kerrin Champagne, MD;  Location: [facility] OR;  Service: Orthopedics;  Laterality: N/A;   CERVICAL DISC ARTHROPLASTY N/A 02/07/2022   Procedure: TOTAL CERVICAL DISC ARTHROPLASTY C5-6;  Surgeon: Kerrin Champagne, MD;  Location: [facility] OR;  Service: Orthopedics;  Laterality: N/A;   NO PAST SURGERIES     TUBAL LIGATION     Social History   Occupational History   Not on file  Tobacco Use   Smoking status: Never   Smokeless  tobacco: Never  Vaping Use   Vaping Use: Never used  Substance and Sexual Activity   Alcohol use: Not Currently   Drug use: No   Sexual activity: Yes    Birth control/protection: None, Surgical

## 2022-04-27 NOTE — Addendum Note (Signed)
Addended by: Vira Browns on: 04/27/2022 09:40 AM   Modules accepted: Orders

## 2022-04-27 NOTE — Patient Instructions (Addendum)
Avoid frequent bending and stooping  No lifting greater than 25 lbs. May use ice or moist heat for pain. Weight loss is of benefit. Best medication for lumbar disc disease is arthritis medications like motrin, celebrex and naprosyn. Exercise is important to improve your indurance and does allow people to function better inspite of back pain.  Avoid overhead lifting and overhead use of the arms. Do not lift greater than 25 lbs. Adjust head rest in vehicle to prevent hyperextension if rear ended. Take extra precautions to avoid falling. Return for follow up with me in 5 weeks then with Dr. Christell Constant, the new spine specialist in 3 months following that.  Vitamin E, coco butter and over the counter Mederma ([facility], [facility], [facility])

## 2022-05-12 DIAGNOSIS — U071 COVID-19: Secondary | ICD-10-CM | POA: Diagnosis not present

## 2022-05-12 DIAGNOSIS — R110 Nausea: Secondary | ICD-10-CM | POA: Diagnosis not present

## 2022-06-01 ENCOUNTER — Encounter: Payer: Self-pay | Admitting: Specialist

## 2022-06-01 ENCOUNTER — Ambulatory Visit: Payer: Medicaid Other | Admitting: Specialist

## 2022-06-01 VITALS — BP 121/85 | HR 73 | Ht 66.0 in | Wt 219.0 lb

## 2022-06-01 DIAGNOSIS — Z981 Arthrodesis status: Secondary | ICD-10-CM | POA: Diagnosis not present

## 2022-06-01 NOTE — Progress Notes (Signed)
Office Visit Note   Patient: Kristin Saunders           Date of Birth: 09-03-1990           MRN: [id number] Visit Date: 06/01/2022              Requested by: Junie Spencer, FNP [address] [place],  [place] [address] PCP: Junie Spencer, FNP   Assessment & Plan: Visit Diagnoses:  1. S/P cervical spinal fusion   2. S/P lumbar fusion   3.      S/P Cervical disc arthroplasty  Plan: Avoid frequent bending and stooping  No lifting greater than 25 lbs. May use ice or moist heat for pain. Weight loss is of benefit. Best medication for lumbar disc disease is arthritis medications like motrin, celebrex and naprosyn. Exercise is important to improve your indurance and does allow people to function better inspite of back pain.  Avoid overhead lifting and overhead use of the arms. Do not lift greater than 25 lbs. Adjust head rest in vehicle to prevent hyperextension if rear ended. Take extra precautions to avoid falling. Return for follow up with me in 5 weeks then with Dr. Christell Constant, the new spine specialist in 3 months following that.  Follow-Up Instructions: No follow-ups on file.   Orders:  No orders of the defined types were placed in this encounter.  No orders of the defined types were placed in this encounter.     Procedures: No procedures performed   Clinical Data: No additional findings.   Subjective: No chief complaint on file.   31 year old right handed female now 4 months post op ACDF 2 levels and artificial disc at the C6-7 level. She works at [employer], doing cooking and prepping, a little bit of everything. Has a current UTI. Being treated.SOme stiffness and tightening of the muscles in the shoulders, neck and low back. History of L4-5 fusion for spondylolisthesis 08/2020.    Review of Systems  Constitutional:  Positive for fatigue (COVID last month, over).  HENT: Negative.    Eyes: Negative.   Respiratory: Negative.    Cardiovascular: Negative.    Gastrointestinal: Negative.   Endocrine: Negative.   Genitourinary: Negative.   Musculoskeletal: Negative.   Skin: Negative.   Allergic/Immunologic: Negative.   Neurological: Negative.   Hematological: Negative.   Psychiatric/Behavioral: Negative.      Objective: Vital Signs: BP 121/85   Pulse 73   Physical Exam Constitutional:      Appearance: She is well-developed.  HENT:     Head: Normocephalic and atraumatic.  Eyes:     Pupils: Pupils are equal, round, and reactive to light.  Pulmonary:     Effort: Pulmonary effort is normal.     Breath sounds: Normal breath sounds.  Abdominal:     General: Bowel sounds are normal.     Palpations: Abdomen is soft.  Musculoskeletal:     Cervical back: Normal range of motion and neck supple.     Lumbar back: Negative right straight leg raise test and negative left straight leg raise test.  Skin:    General: Skin is warm and dry.  Neurological:     Mental Status: She is alert and oriented to person, place, and time.  Psychiatric:        Behavior: Behavior normal.        Thought Content: Thought content normal.        Judgment: Judgment normal.   Back Exam  Tenderness  The patient is experiencing tenderness in the lumbar and cervical.  Range of Motion  Extension:  60 abnormal  Flexion:  80 abnormal  Lateral bend right:  40 abnormal  Lateral bend left:  40 abnormal  Rotation right:  60 abnormal  Rotation left:  60 abnormal   Muscle Strength  Right Quadriceps:  5/5  Left Quadriceps:  5/5  Right Hamstrings:  5/5  Left Hamstrings:  5/5   Tests  Straight leg raise right: negative Straight leg raise left: negative  Other  Toe walk: normal Heel walk: normal Sensation: normal    Specialty Comments:  No specialty comments available.  Imaging: No results found.   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   Obstructive sleep apnea of adult 05/13/2020   Obesity (BMI 30.0-34.9) 03/19/2018   Past Medical History:  Diagnosis Date   Anxiety    Arthritis    Depression    GERD (gastroesophageal reflux disease)    Headache    Medical history non-contributory     Family History  Problem Relation Age of Onset   Cancer Father        COLON    Past Surgical History:  Procedure Laterality Date   ANTERIOR CERVICAL DECOMP/DISCECTOMY FUSION N/A 02/07/2022   Procedure: ANTERIOR CERVICAL DISCECTOMY AND  FUSION C3-4,  C4-5 WITH PLATE, SCREWS, LOCAL BONE GRAFT AND VIVIGEN;  Surgeon: Jessy Oto, MD;  Location: [facility];  Service: Orthopedics;  Laterality: N/A;   CERVICAL DISC ARTHROPLASTY N/A 02/07/2022   Procedure: TOTAL CERVICAL DISC ARTHROPLASTY C5-6;  Surgeon: Jessy Oto, MD;  Location: [facility];  Service: Orthopedics;  Laterality: N/A;   NO PAST SURGERIES     TUBAL LIGATION     Social History   Occupational History   Not on file  Tobacco Use   Smoking status: Never   Smokeless tobacco: Never  Vaping Use   Vaping Use: Never used  Substance and Sexual Activity   Alcohol use: Not Currently   Drug use: No   Sexual activity: Yes    Birth control/protection: None, Surgical

## 2022-06-01 NOTE — Patient Instructions (Signed)
Plan: Avoid frequent bending and stooping  No lifting greater than 25 lbs. May use ice or moist heat for pain. Weight loss is of benefit. Best medication for lumbar disc disease is arthritis medications like motrin, celebrex and naprosyn. Exercise is important to improve your indurance and does allow people to function better inspite of back pain.  Avoid overhead lifting and overhead use of the arms. Do not lift greater than 25 lbs. Adjust head rest in vehicle to prevent hyperextension if rear ended. Take extra precautions to avoid falling. Return for follow up with me in 5 weeks then with Dr. Laurance Flatten, the new spine specialist in 3 months following that.

## 2022-06-02 ENCOUNTER — Ambulatory Visit: Payer: Medicaid Other | Admitting: Family Medicine

## 2022-06-02 ENCOUNTER — Encounter: Payer: Self-pay | Admitting: Family Medicine

## 2022-06-02 VITALS — BP 101/73 | HR 81 | Temp 97.3°F | Ht 66.0 in | Wt 222.0 lb

## 2022-06-02 DIAGNOSIS — R300 Dysuria: Secondary | ICD-10-CM

## 2022-06-02 DIAGNOSIS — N3000 Acute cystitis without hematuria: Secondary | ICD-10-CM

## 2022-06-02 DIAGNOSIS — B3731 Acute candidiasis of vulva and vagina: Secondary | ICD-10-CM | POA: Diagnosis not present

## 2022-06-02 LAB — URINALYSIS, ROUTINE W REFLEX MICROSCOPIC
Bilirubin, UA: NEGATIVE
Glucose, UA: NEGATIVE
Ketones, UA: NEGATIVE
Nitrite, UA: NEGATIVE
RBC, UA: NEGATIVE
Specific Gravity, UA: >1.03 — ABNORMAL HIGH (ref 1.005–1.030)
Urobilinogen, Ur: 0.2 mg/dL (ref 0.2–1.0)
pH, UA: 6 (ref 5.0–7.5)

## 2022-06-02 LAB — MICROSCOPIC EXAMINATION
Epithelial Cells (non renal): >10 /hpf — AB (ref 0–10)
RBC, Urine: NONE SEEN /hpf (ref 0–2)
Renal Epithel, UA: NONE SEEN /hpf

## 2022-06-02 MED ORDER — SULFAMETHOXAZOLE-TRIMETHOPRIM 800-160 MG PO TABS: 1.0000 | ORAL_TABLET | Freq: Two times a day (BID) | ORAL | 0 refills | Status: AC

## 2022-06-02 MED ORDER — FLUCONAZOLE 150 MG PO TABS: 150.0000 mg | ORAL_TABLET | Freq: Once | ORAL | 0 refills | Status: AC

## 2022-06-02 NOTE — Progress Notes (Signed)
Subjective:  Patient ID: Kristin Saunders, female    DOB: 1990-08-30, 30 y.o.   MRN: [id number]  Patient Care Team: Junie Spencer, FNP as PCP - General (Nurse Practitioner)   Chief Complaint:  Dysuria (X 1 week )   HPI: Kristin Saunders is a 31 y.o. female presenting on 06/02/2022 for Dysuria (X 1 week )   Dysuria  This is a new problem. Episode onset: 7 days ago. The problem occurs every urination. The problem has been waxing and waning. The quality of the pain is described as burning and shooting. The pain is moderate. There has been no fever. She is Not sexually active. There is No history of pyelonephritis. Associated symptoms include a discharge, frequency and urgency. Pertinent negatives include no chills, flank pain, hematuria, hesitancy, nausea, possible pregnancy, sweats or vomiting. She has tried increased fluids (AZO) for the symptoms. The treatment provided no relief.     Relevant past medical, surgical, family, and social history reviewed and updated as indicated.  Allergies and medications reviewed and updated. Data reviewed: Chart in Epic.   Past Medical History:  Diagnosis Date   Anxiety    Arthritis    Depression    GERD (gastroesophageal reflux disease)    Headache    Medical history non-contributory     Past Surgical History:  Procedure Laterality Date   ANTERIOR CERVICAL DECOMP/DISCECTOMY FUSION N/A 02/07/2022   Procedure: ANTERIOR CERVICAL DISCECTOMY AND  FUSION C3-4,  C4-5 WITH PLATE, SCREWS, LOCAL BONE GRAFT AND VIVIGEN;  Surgeon: Kerrin Champagne, MD;  Location: [facility];  Service: Orthopedics;  Laterality: N/A;   CERVICAL DISC ARTHROPLASTY N/A 02/07/2022   Procedure: TOTAL CERVICAL DISC ARTHROPLASTY C5-6;  Surgeon: Kerrin Champagne, MD;  Location: [facility];  Service: Orthopedics;  Laterality: N/A;   NO PAST SURGERIES     TUBAL LIGATION      Social History   Socioeconomic History   Marital status: Single    Spouse name: Not on file   Number of children:  2   Years of education: Not on file   Highest education level: Not on file  Occupational History   Not on file  Tobacco Use   Smoking status: Never   Smokeless tobacco: Never  Vaping Use   Vaping Use: Never used  Substance and Sexual Activity   Alcohol use: Not Currently   Drug use: No   Sexual activity: Yes    Birth control/protection: None, Surgical  Other Topics Concern   Not on file  Social History Narrative   Not on file   Social Determinants of Health   Financial Resource Strain: Not on file  Food Insecurity: Not on file  Transportation Needs: Not on file  Physical Activity: Not on file  Stress: Not on file  Social Connections: Not on file  Intimate Partner Violence: Not on file    Outpatient Encounter Medications as of 06/02/2022  Medication Sig   docusate sodium (COLACE) 100 MG capsule Take 1 capsule (100 mg total) by mouth 2 (two) times daily.   fluconazole (DIFLUCAN) 150 MG tablet Take 1 tablet (150 mg total) by mouth once for 1 dose.   gabapentin (NEURONTIN) 100 MG capsule Take 1 capsule (100 mg total) by mouth at bedtime. (Patient taking differently: Take 100 mg by mouth at bedtime as needed (pain).)   meloxicam (MOBIC) 15 MG tablet Take 1 tablet (15 mg total) by mouth daily.   Menthol-Methyl Salicylate (SALONPAS PAIN RELIEF PATCH) PTCH Apply  1 patch topically daily as needed (pain).   methocarbamol (ROBAXIN) 500 MG tablet Take 1 tablet (500 mg total) by mouth 4 (four) times daily.   polyethylene glycol (MIRALAX / GLYCOLAX) 17 g packet Take 17 g by mouth daily as needed for mild constipation.   sulfamethoxazole-trimethoprim (BACTRIM DS) 800-160 MG tablet Take 1 tablet by mouth 2 (two) times daily for 7 days.   No facility-administered encounter medications on file as of 06/02/2022.    No Known Allergies  Review of Systems  Constitutional:  Negative for activity change, appetite change, chills, diaphoresis, fatigue, fever and unexpected weight change.   Respiratory:  Negative for cough and shortness of breath.   Cardiovascular:  Negative for chest pain, palpitations and leg swelling.  Gastrointestinal:  Negative for abdominal pain, nausea and vomiting.  Genitourinary:  Positive for dysuria, frequency, urgency and vaginal discharge. Negative for decreased urine volume, difficulty urinating, dyspareunia, enuresis, flank pain, genital sores, hematuria, hesitancy, menstrual problem, pelvic pain, vaginal bleeding and vaginal pain.  Musculoskeletal:  Negative for arthralgias and myalgias.  Neurological:  Negative for dizziness, weakness, light-headedness and headaches.  Psychiatric/Behavioral:  Negative for confusion.   All other systems reviewed and are negative.       Objective:  BP 101/73   Pulse 81   Temp (!) 97.3 F (36.3 C) (Temporal)   Ht 5\' 6"  (1.676 m)   Wt 222 lb (100.7 kg)   LMP 05/03/2022   SpO2 96%   BMI 35.83 kg/m    Wt Readings from Last 3 Encounters:  06/02/22 222 lb (100.7 kg)  06/01/22 219 lb (99.3 kg)  04/27/22 219 lb (99.3 kg)    Physical Exam Vitals and nursing note reviewed.  Constitutional:      General: She is not in acute distress.    Appearance: Normal appearance. She is obese. She is not ill-appearing, toxic-appearing or diaphoretic.  HENT:     Head: Normocephalic and atraumatic.     Mouth/Throat:     Mouth: Mucous membranes are moist.  Eyes:     Pupils: Pupils are equal, round, and reactive to light.  Cardiovascular:     Rate and Rhythm: Normal rate and regular rhythm.     Heart sounds: Normal heart sounds.  Pulmonary:     Effort: Pulmonary effort is normal.     Breath sounds: Normal breath sounds.  Abdominal:     General: Bowel sounds are normal. There is no distension.     Palpations: Abdomen is soft. There is no mass.     Tenderness: There is no abdominal tenderness. There is no right CVA tenderness, left CVA tenderness, guarding or rebound.     Hernia: No hernia is present.  Skin:     General: Skin is warm and dry.     Capillary Refill: Capillary refill takes less than 2 seconds.  Neurological:     General: No focal deficit present.     Mental Status: She is alert and oriented to person, place, and time.  Psychiatric:        Mood and Affect: Mood normal.        Behavior: Behavior normal.     Results for orders placed or performed during the hospital encounter of 02/07/22  CBC  Result Value Ref Range   WBC 10.4 4.0 - 10.5 K/uL   RBC 4.35 3.87 - 5.11 MIL/uL   Hemoglobin 13.5 12.0 - 15.0 g/dL   HCT 02/09/22 88.4 - 16.6 %   MCV 94.9 80.0 -  100.0 fL   MCH 31.0 26.0 - 34.0 pg   MCHC 32.7 30.0 - 36.0 g/dL   RDW 13.2 11.5 - 15.5 %   Platelets 267 150 - 400 K/uL   nRBC 0.0 0.0 - 0.2 %  Basic metabolic panel  Result Value Ref Range   Sodium 141 135 - 145 mmol/L   Potassium 4.0 3.5 - 5.1 mmol/L   Chloride 111 98 - 111 mmol/L   CO2 22 22 - 32 mmol/L   Glucose, Bld 120 (H) 70 - 99 mg/dL   BUN 8 6 - 20 mg/dL   Creatinine, Ser 0.60 0.44 - 1.00 mg/dL   Calcium 8.7 (L) 8.9 - 10.3 mg/dL   GFR, Estimated >60 >60 mL/min   Anion gap 8 5 - 15  Pregnancy, urine POC  Result Value Ref Range   Preg Test, Ur NEGATIVE NEGATIVE     Urinalysis in office: 2+ leukocytes, trace protein, many bacteria, yeast  Pertinent labs & imaging results that were available during my care of the patient were reviewed by me and considered in my medical decision making.  Assessment & Plan:  Kristin Saunders was seen today for dysuria.  Diagnoses and all orders for this visit:  Dysuria Acute cystitis without hematuria Urinalysis as noted. Culture pending. Will treat with below and change if warranted. No red flags concerning for acute pyelonephritis. Report new, worsening, or persistent symptoms. Repeat urinalysis in 2 weeks.  -     Urinalysis, Routine w reflex microscopic -     Urine Culture -     sulfamethoxazole-trimethoprim (BACTRIM DS) 800-160 MG tablet; Take 1 tablet by mouth 2 (two) times daily for  7 days.  Candida vaginitis Will treat with below. Symptomatic care discussed in detail.  -     fluconazole (DIFLUCAN) 150 MG tablet; Take 1 tablet (150 mg total) by mouth once for 1 dose.     Continue all other maintenance medications.  Follow up plan: Return in about 2 weeks (around 06/16/2022), or if symptoms worsen or fail to improve, for urine.   Continue healthy lifestyle choices, including diet (rich in fruits, vegetables, and lean proteins, and low in salt and simple carbohydrates) and exercise (at least 30 minutes of moderate physical activity daily).  Educational handout given for UTI  The above assessment and management plan was discussed with the patient. The patient verbalized understanding of and has agreed to the management plan. Patient is aware to call the clinic if they develop any new symptoms or if symptoms persist or worsen. Patient is aware when to return to the clinic for a follow-up visit. Patient educated on when it is appropriate to go to the emergency department.   Monia Pouch, FNP-C [facility] [phone]

## 2022-06-04 LAB — URINE CULTURE

## 2022-07-12 ENCOUNTER — Telehealth: Payer: Medicaid Other | Admitting: Nurse Practitioner

## 2022-07-12 ENCOUNTER — Encounter: Payer: Self-pay | Admitting: Nurse Practitioner

## 2022-07-12 DIAGNOSIS — J028 Acute pharyngitis due to other specified organisms: Secondary | ICD-10-CM

## 2022-07-12 DIAGNOSIS — B9689 Other specified bacterial agents as the cause of diseases classified elsewhere: Secondary | ICD-10-CM

## 2022-07-12 MED ORDER — AMOXICILLIN-POT CLAVULANATE 875-125 MG PO TABS: 1.0000 | ORAL_TABLET | Freq: Two times a day (BID) | ORAL | 0 refills | Status: DC

## 2022-07-12 NOTE — Patient Instructions (Signed)
Sore Throat When you have a sore throat, your throat may feel: Tender. Burning. Irritated. Scratchy. Painful when you swallow. Painful when you talk. Many things can cause a sore throat, such as: An infection. Allergies. Dry air. Smoke or pollution. Radiation treatment for cancer. Gastroesophageal reflux disease (GERD). A tumor. A sore throat can be the first sign of another sickness. It can happen with other problems, like: Coughing. Sneezing. Fever. Swelling of the glands in the neck. Most sore throats go away without treatment. Follow these instructions at home:     Medicines Take over-the-counter and prescription medicines only as told by your doctor. Children often get sore throats. Do not give your child aspirin. Use throat sprays to soothe your throat as told by your health care provider. Managing pain To help with pain: Sip warm liquids, such as broth, herbal tea, or warm water. Eat or drink cold or frozen liquids, such as frozen ice pops. Rinse your mouth (gargle) with a salt water mixture 3-4 times a day or as needed. To make salt water, dissolve -1 tsp (3-6 g) of salt in 1 cup (237 mL) of warm water. Do not swallow this mixture. Suck on hard candy or throat lozenges. Put a cool-mist humidifier in your bedroom at night. Sit in the bathroom with the door closed for 5-10 minutes while you run hot water in the shower. General instructions Do not smoke or use any products that contain nicotine or tobacco. If you need help quitting, ask your doctor. Get plenty of rest. Drink enough fluid to keep your pee (urine) pale yellow. Wash your hands often for at least 20 seconds with soap and water. If soap and water are not available, use hand sanitizer. Contact a doctor if: You have a fever for more than 2-3 days. You keep having symptoms for more than 2-3 days. Your throat does not get better in 7 days. You have a fever and your symptoms suddenly get worse. Your  child who is 3 months to 3 years old has a temperature of 102.2F (39C) or higher. Get help right away if: You have trouble breathing. You cannot swallow fluids, soft foods, or your spit. You have swelling in your throat or neck that gets worse. You feel like you may vomit (nauseous) and this feeling lasts a long time. You cannot stop vomiting. These symptoms may be an emergency. Get help right away. Call your local emergency services (911 in the [place] Do not wait to see if the symptoms will go away. Do not drive yourself to the hospital. Summary A sore throat is a painful, burning, irritated, or scratchy throat. Many things can cause a sore throat. Take over-the-counter medicines only as told by your doctor. Get plenty of rest. Drink enough fluid to keep your pee (urine) pale yellow. Contact a doctor if your symptoms get worse or your sore throat does not get better within 7 days. This information is not intended to replace advice given to you by your health care provider. Make sure you discuss any questions you have with your health care provider. Document Revised: 11/03/2020 Document Reviewed: 11/03/2020 Elsevier Patient Education  2023 [employer] Inc[employer]  

## 2022-07-12 NOTE — Progress Notes (Signed)
   Virtual Visit  Note Due to COVID-19 pandemic this visit was conducted virtually. This visit type was conducted due to national recommendations for restrictions regarding the COVID-19 Pandemic (e.g. social distancing, sheltering in place) in an effort to limit this patient's exposure and mitigate transmission in our community. All issues noted in this document were discussed and addressed.  A physical exam was not performed with this format.  I connected with Kristin Saunders on 07/12/22 at 12:40 pm  by telephone and verified that I am speaking with the correct person using two identifiers. Kristin Saunders is currently located at  during visit. The provider, Daryll Drown, NP is located in their office at time of visit.  I discussed the limitations, risks, security and privacy concerns of performing an evaluation and management service by telephone and the availability of in person appointments. I also discussed with the patient that there may be a patient responsible charge related to this service. The patient expressed understanding and agreed to proceed.   History and Present Illness:  Sore Throat  This is a new problem. Episode onset: in the pasrt 4-5 days. The problem has been unchanged. Neither side of throat is experiencing more pain than the other. The maximum temperature recorded prior to her arrival was 100.4 - 100.9 F. The fever has been present for 1 to 2 days. The pain is moderate. Associated symptoms include congestion, neck pain, swollen glands and trouble swallowing. She has had exposure to strep. She has tried acetaminophen for the symptoms. The treatment provided no relief.      Review of Systems  Constitutional:  Positive for chills and fever. Negative for malaise/fatigue.  HENT:  Positive for congestion, sore throat and trouble swallowing.   Musculoskeletal:  Positive for neck pain.  Skin: Negative.  Negative for rash.  All other systems reviewed and are  negative.    Observations/Objective: Televisit patient is in distress  Assessment and Plan: Patient presents with sore throat, congestion, and fever for 5 days. Patient exposed to sick family members. I am treating patient based on symptoms. I advised patient to: Take meds as prescribed - Use a cool mist humidifier  -Use saline nose sprays frequently -Force fluids -salt water gargle -For fever or aches or pains- take Tylenol or ibuprofen. -If symptoms do not improve, she may need to be COVID tested to rule this out Follow up with worsening unresolved symptoms     Follow Up Instructions: Follow-up worsening symptoms    I discussed the assessment and treatment plan with the patient. The patient was provided an opportunity to ask questions and all were answered. The patient agreed with the plan and demonstrated an understanding of the instructions.   The patient was advised to call back or seek an in-person evaluation if the symptoms worsen or if the condition fails to improve as anticipated.  The above assessment and management plan was discussed with the patient. The patient verbalized understanding of and has agreed to the management plan. Patient is aware to call the clinic if symptoms persist or worsen. Patient is aware when to return to the clinic for a follow-up visit. Patient educated on when it is appropriate to go to the emergency department.   Time call ended: 12:53 pm    I provided 13 minutes of  non face-to-face time during this encounter.    Daryll Drown, NP

## 2022-09-01 ENCOUNTER — Ambulatory Visit (INDEPENDENT_AMBULATORY_CARE_PROVIDER_SITE_OTHER): Payer: Medicaid Other

## 2022-09-01 ENCOUNTER — Ambulatory Visit (INDEPENDENT_AMBULATORY_CARE_PROVIDER_SITE_OTHER): Payer: Medicaid Other | Admitting: Orthopedic Surgery

## 2022-09-01 DIAGNOSIS — Z981 Arthrodesis status: Secondary | ICD-10-CM | POA: Diagnosis not present

## 2022-09-01 NOTE — Progress Notes (Signed)
Orthopedic Spine Surgery Office Note  Assessment: Patient is a 32 y.o. female status post L5/S1 decompression and TLIF.  Also status post C3-5 ACDF and C5-6 disc arthroplasty. Doing well   Plan: -Out of bed as tolerated, no brace needed -Activity as tolerated -Over-the-counter pain medications as needed for pain relief -Patient should return to office in 6 months, x-rays at next visit: AP/lateral/flex/ex cervical and lumbar   Patient expressed understanding of the plan and all questions were answered to the patient's satisfaction.   ___________________________________________________________________________   History:  Patient is a 32 y.o. female who presents today for post-op follow up from my retired partner, Dr. Louanne Skye.  Patient had undergone C3-5 ACDF and C5-6 disc arthroplasty along with L5/S1 decompression and TLIF with Dr. Louanne Skye.  She comes in today for routine follow-up.  She states that she is not having any pain.  Has not noticed any issues with the incisions.  No pain rating down her arms or her legs.  Has not noticed any imbalance.  No changes in bowel or bladder habits.  Denies numbness and paresthesias.   Review of systems: Denies fevers and chills, night sweats, unexplained weight loss, history of cancer, pain that wakes them at night  Past medical history: Anxiety Depression GERD Headaches  Allergies: NKDA  Past surgical history:  L5/S1 TLIF, PSIF (08/24/2020- [facility]) C3-5 ACDF and C5/6 disc arthroplasty (02/07/2022 - Nitka) Tubal ligation  Social history: Denies use of nicotine product (smoking, vaping, patches, smokeless) Alcohol use: denies Denies recreational drug use  Physical Exam:  General: no acute distress, appears stated age Neurologic: alert, answering questions appropriately, following commands Respiratory: unlabored breathing on room air, symmetric chest rise Psychiatric: appropriate affect, normal cadence to speech  Lumbar spine incision  appears well-healed with no evidence of infection.  Anterior cervical spine incision appears well-healed with no evidence of infection  MSK (spine):  -Strength exam      Left  Right Grip strength                5/5  5/5 Interosseus   5/5   5/5 Wrist extension  5/5  5/5 Wrist flexion   5/5  5/5 Elbow flexion   5/5  5/5 Deltoid    5/5  5/5  EHL    5/5  5/5 TA    5/5  5/5 GSC    5/5  5/5 Knee extension  5/5  5/5 Hip flexion   5/5  5/5  -Sensory exam    Sensation intact to light touch in L3-S1 nerve distributions of bilateral lower extremities  Sensation intact to light touch in C5-T1 nerve distributions of bilateral upper extremities  -Hoffman sign: negative bilaterally -Clonus: no beats bilaterally -Interosseous wasting: none seen -Gait: normal  Imaging: XR of the cervical spine from 09/01/2022 was independently reviewed and interpreted, showing ACDF from C3-5 and disc arhtroplasty at C5/6 with no evidence of complication. No fracture or dislocation seen.   XR of the lumbar spine from 09/01/2022 was independently reviewed and interpreted, showing lucency around the L5 screws. No backing out of the screws. No lucency around the S1 screws. Interbody is in place at L5/S1 with no evidence of complication. L5/S1 laminectomy defect. No fracture or dislocation. Lordotic alignment.    Patient name: Kristin Saunders Patient MRN: [id number] Date of visit: 09/01/22

## 2022-09-28 DIAGNOSIS — R0981 Nasal congestion: Secondary | ICD-10-CM | POA: Diagnosis not present

## 2022-10-17 ENCOUNTER — Ambulatory Visit: Payer: Medicaid Other | Admitting: Family Medicine

## 2022-10-17 ENCOUNTER — Encounter: Payer: Self-pay | Admitting: Family Medicine

## 2022-10-17 VITALS — BP 97/64 | HR 92 | Temp 99.2°F | Ht 66.0 in | Wt 222.0 lb

## 2022-10-17 DIAGNOSIS — R6889 Other general symptoms and signs: Secondary | ICD-10-CM | POA: Diagnosis not present

## 2022-10-17 DIAGNOSIS — J029 Acute pharyngitis, unspecified: Secondary | ICD-10-CM

## 2022-10-17 DIAGNOSIS — J020 Streptococcal pharyngitis: Secondary | ICD-10-CM | POA: Diagnosis not present

## 2022-10-17 LAB — RAPID STREP SCREEN (MED CTR MEBANE ONLY): Strep Gp A Ag, IA W/Reflex: POSITIVE — AB

## 2022-10-17 MED ORDER — CEFDINIR 300 MG PO CAPS: 600.0000 mg | ORAL_CAPSULE | Freq: Every day | ORAL | 0 refills | Status: DC

## 2022-10-17 MED ORDER — CEFDINIR 300 MG PO CAPS: 300.0000 mg | ORAL_CAPSULE | Freq: Two times a day (BID) | ORAL | 0 refills | Status: AC

## 2022-10-17 NOTE — Progress Notes (Signed)
Acute Office Visit  Subjective:  Patient ID: Kristin Saunders, female    DOB: 28-Apr-1991, 32 y.o.   MRN: [id number]  Chief Complaint  Patient presents with   Sore Throat   Cough   Patient is in today for sore throat and cough.  Started 6 days days ago, last Thursday. Pt kids were sick before her with strep. Symptoms include sore throat. Endorses white patches, sinus pressure, cough, congestion, runny nose, fever highest at 100.1 this morning 8:30. Pain 10/10. States she still has her tonsils. Has tried motrin and nyquil/dayquil. States they are helping a little, not much.  Denies abdominal pain and vomiting. Endorses trouble swallowing but states it is due to pain.   Review of Systems  Respiratory:  Positive for cough.   Gastrointestinal:  Negative for abdominal pain and diarrhea.    Objective:  BP 97/64   Pulse 92   Temp 99.2 F (37.3 C)   Ht '5\' 6"'$  (1.676 m)   Wt 222 lb (100.7 kg)   LMP 10/03/2022 (Approximate)   SpO2 96%   BMI 35.83 kg/m    Physical Exam Constitutional:      General: She is not in acute distress.    Appearance: Normal appearance. She is not ill-appearing, toxic-appearing or diaphoretic.  HENT:     Right Ear: Tympanic membrane normal.     Left Ear: Tympanic membrane normal.     Nose: Congestion and rhinorrhea present.     Mouth/Throat:     Lips: Pink.     Mouth: Mucous membranes are moist. No oral lesions.     Pharynx: Posterior oropharyngeal erythema present. No oropharyngeal exudate.     Tonsils: No tonsillar exudate or tonsillar abscesses. 2+ on the right. 2+ on the left.  Cardiovascular:     Rate and Rhythm: Normal rate.     Pulses: Normal pulses.     Heart sounds: Normal heart sounds. No murmur heard.    No gallop.  Pulmonary:     Effort: Pulmonary effort is normal. No respiratory distress.     Breath sounds: Normal breath sounds. No stridor. No wheezing, rhonchi or rales.  Lymphadenopathy:     Cervical: Cervical adenopathy present.      Right cervical: Posterior cervical adenopathy present.     Left cervical: Posterior cervical adenopathy present.  Skin:    General: Skin is warm.     Capillary Refill: Capillary refill takes less than 2 seconds.  Neurological:     General: No focal deficit present.     Mental Status: She is alert and oriented to person, place, and time. Mental status is at baseline.     Motor: No weakness.  Psychiatric:        Mood and Affect: Mood normal.        Behavior: Behavior normal.        Thought Content: Thought content normal.        Judgment: Judgment normal.    Assessment & Plan:  1. Strep pharyngitis Medication as below. Pt treated with augmentin in 06/2022 for strep pharyngitis. Will use Cefdinir as below.  - cefdinir (OMNICEF) 300 MG capsule; Take 2 capsules (600 mg total) by mouth daily for 10 days.  Dispense: 20 capsule; Refill: 0  2. Sore throat Labs as below. Will communicate results to patient once available.  Strep positive. Pt notified in clinic. - Rapid Strep Screen ([facility] ONLY) - Culture, Group A Strep  3. Flu-like symptoms Labs as below. Will communicate  results to patient once available.  - COVID-19, Flu A+B and RSV  Pt screened positive for minimal anxiety today on GAD-7 offered follow up, pt declined at this time.   The above assessment and management plan was discussed with the patient. The patient verbalized understanding of and has agreed to the management plan using shared-decision making. Patient is aware to call the clinic if they develop any new symptoms or if symptoms fail to improve or worsen. Patient is aware when to return to the clinic for a follow-up visit. Patient educated on when it is appropriate to go to the emergency department.   Return if symptoms worsen or fail to improve.  Donzetta Kohut, DNP-FNP [facility] [address] [place], [place] [address] [phone]

## 2022-10-18 LAB — COVID-19, FLU A+B AND RSV
Influenza A, NAA: NOT DETECTED
Influenza B, NAA: NOT DETECTED
RSV, NAA: NOT DETECTED
SARS-CoV-2, NAA: NOT DETECTED

## 2022-12-07 ENCOUNTER — Encounter: Payer: Self-pay | Admitting: Family Medicine

## 2022-12-07 ENCOUNTER — Telehealth (INDEPENDENT_AMBULATORY_CARE_PROVIDER_SITE_OTHER): Payer: Medicaid Other | Admitting: Family Medicine

## 2022-12-07 DIAGNOSIS — J029 Acute pharyngitis, unspecified: Secondary | ICD-10-CM

## 2022-12-07 DIAGNOSIS — J020 Streptococcal pharyngitis: Secondary | ICD-10-CM

## 2022-12-07 LAB — RAPID STREP SCREEN (MED CTR MEBANE ONLY): Strep Gp A Ag, IA W/Reflex: POSITIVE — AB

## 2022-12-07 MED ORDER — AMOXICILLIN 500 MG PO CAPS: 500.0000 mg | ORAL_CAPSULE | Freq: Two times a day (BID) | ORAL | 0 refills | Status: DC

## 2022-12-07 NOTE — Addendum Note (Signed)
Addended by: Arville Care on: 12/07/2022 03:22 PM   Modules accepted: Orders

## 2022-12-07 NOTE — Progress Notes (Signed)
Pt has been notified of results.

## 2022-12-07 NOTE — Progress Notes (Signed)
Patient came back positive for strep, please let her know that I sent amoxicillin for her.

## 2022-12-07 NOTE — Progress Notes (Signed)
Virtual Visit via MyChart video note  I connected with Kristin Saunders on 12/07/22 at 1328 by video and verified that I am speaking with the correct person using two identifiers. Kristin Saunders is currently located at home and patient are currently with her during visit. The provider, Elige Radon Otilio Groleau, MD is located in their office at time of visit.  Call ended at 1336  I discussed the limitations, risks, security and privacy concerns of performing an evaluation and management service by video and the availability of in person appointments. I also discussed with the patient that there may be a patient responsible charge related to this service. The patient expressed understanding and agreed to proceed.   History and Present Illness: Patient is calling in for sore throat and mucous and drainage. She lost her voice yesterday. She has not had fevers or chills or SOB or Wheezing. She has a cough and congestion.  She has sinus drainage and phlegm. She has non productive cough.  She has fought strep before. She has used cough lozenges and chloraseptic spray.  They have not helped. She has trouble swallowing.   1. Acute pharyngitis, unspecified etiology     No outpatient encounter medications on file as of 12/07/2022.   No facility-administered encounter medications on file as of 12/07/2022.    Review of Systems  Constitutional:  Negative for chills and fever.  HENT:  Positive for postnasal drip, sinus pressure and sore throat. Negative for congestion, ear discharge, ear pain, rhinorrhea and sneezing.   Eyes:  Negative for pain, redness and visual disturbance.  Respiratory:  Positive for cough. Negative for chest tightness and shortness of breath.   Cardiovascular:  Negative for chest pain and leg swelling.  Genitourinary:  Negative for difficulty urinating and dysuria.  Musculoskeletal:  Negative for back pain and gait problem.  Skin:  Negative for rash.  Neurological:  Negative for  light-headedness and headaches.  Psychiatric/Behavioral:  Negative for agitation and behavioral problems.   All other systems reviewed and are negative.   Observations/Objective: Patient will come around 3 PM to get tested for strep.  Sounds comfortable and in no acute distress  Assessment and Plan: Problem List Items Addressed This Visit   None Visit Diagnoses     Acute pharyngitis, unspecified etiology    -  Primary   Relevant Orders   Rapid Strep Screen ([facility] ONLY)       Will come and get tested for strep to see if it is positive or negative. Depending on that test we will assess treatment. Follow up plan: Return if symptoms worsen or fail to improve.     I discussed the assessment and treatment plan with the patient. The patient was provided an opportunity to ask questions and all were answered. The patient agreed with the plan and demonstrated an understanding of the instructions.   The patient was advised to call back or seek an in-person evaluation if the symptoms worsen or if the condition fails to improve as anticipated.  The above assessment and management plan was discussed with the patient. The patient verbalized understanding of and has agreed to the management plan. Patient is aware to call the clinic if symptoms persist or worsen. Patient is aware when to return to the clinic for a follow-up visit. Patient educated on when it is appropriate to go to the emergency department.    I provided 8 minutes of non-face-to-face time during this encounter.    Elige Radon Ivry Pigue,  MD

## 2022-12-21 ENCOUNTER — Encounter: Payer: Self-pay | Admitting: Family Medicine

## 2022-12-21 ENCOUNTER — Telehealth: Payer: Medicaid Other | Admitting: Family Medicine

## 2022-12-21 DIAGNOSIS — J0110 Acute frontal sinusitis, unspecified: Secondary | ICD-10-CM

## 2022-12-21 DIAGNOSIS — J029 Acute pharyngitis, unspecified: Secondary | ICD-10-CM

## 2022-12-21 MED ORDER — AMOXICILLIN-POT CLAVULANATE 875-125 MG PO TABS: 1.0000 | ORAL_TABLET | Freq: Two times a day (BID) | ORAL | 0 refills | Status: AC

## 2022-12-21 NOTE — Progress Notes (Signed)
   Virtual Visit via video Note   Due to COVID-19 pandemic this visit was conducted virtually. This visit type was conducted due to national recommendations for restrictions regarding the COVID-19 Pandemic (e.g. social distancing, sheltering in place) in an effort to limit this patient's exposure and mitigate transmission in our community. All issues noted in this document were discussed and addressed.  A physical exam was not performed with this format.  I connected with  Kristin Saunders  on 12/21/22 at 1311 by video and verified that I am speaking with the correct person using two identifiers. Kristin Saunders is currently located at home and no one is currently with her during the visit. The provider, Gabriel Earing, FNP is located in their office at time of visit.  I discussed the limitations, risks, security and privacy concerns of performing an evaluation and management service by video  and the availability of in person appointments. I also discussed with the patient that there may be a patient responsible charge related to this service. The patient expressed understanding and agreed to proceed.  CC: cough  History and Present Illness:  Kristin Saunders reports cough, congestion, and sore throat that has been ongoing for weeks. She was recently treated with amoxicillin for pharyngitis. Her symptoms did improved with this and then worsened again with increased cough, sore throat, frontal sinus pressure, and sore throat. She denies fever, chest pain, or shortness of pain. She also reports that her kids currently have strep. She has been taking OTC cold and allergy medications without improvement. She is unable to come by the office today as she does not have transportation.   ROS As per HPI.   Observations/Objective: Alert and oriented. Respirations unlabored. No cyanosis. Non toxic appearing. Normal mood and behavior.    Assessment and Plan: Kristin Saunders was seen today for cough.  Diagnoses and all  orders for this visit:  Acute non-recurrent frontal sinusitis Sore throat Augmentin as below. Discussed symptomatic care and return precautions.  -     amoxicillin-clavulanate (AUGMENTIN) 875-125 MG tablet; Take 1 tablet by mouth 2 (two) times daily for 7 days.     Follow Up Instructions: Return to office for new or worsening symptoms, or if symptoms persist.      I discussed the assessment and treatment plan with the patient. The patient was provided an opportunity to ask questions and all were answered. The patient agreed with the plan and demonstrated an understanding of the instructions.   The patient was advised to call back or seek an in-person evaluation if the symptoms worsen or if the condition fails to improve as anticipated.  The above assessment and management plan was discussed with the patient. The patient verbalized understanding of and has agreed to the management plan. Patient is aware to call the clinic if symptoms persist or worsen. Patient is aware when to return to the clinic for a follow-up visit. Patient educated on when it is appropriate to go to the emergency department.   Time call ended: 1316  I provided 5 minutes of face-to-face time during this encounter.    Gabriel Earing, FNP

## 2023-03-01 ENCOUNTER — Ambulatory Visit: Payer: Medicaid Other | Admitting: Orthopedic Surgery

## 2023-03-15 DIAGNOSIS — H5213 Myopia, bilateral: Secondary | ICD-10-CM | POA: Diagnosis not present

## 2023-03-17 DIAGNOSIS — N3000 Acute cystitis without hematuria: Secondary | ICD-10-CM | POA: Diagnosis not present

## 2023-03-17 DIAGNOSIS — J029 Acute pharyngitis, unspecified: Secondary | ICD-10-CM | POA: Diagnosis not present

## 2023-03-23 DIAGNOSIS — R300 Dysuria: Secondary | ICD-10-CM | POA: Diagnosis not present

## 2023-03-23 DIAGNOSIS — B9689 Other specified bacterial agents as the cause of diseases classified elsewhere: Secondary | ICD-10-CM | POA: Diagnosis not present

## 2023-03-23 DIAGNOSIS — R1030 Lower abdominal pain, unspecified: Secondary | ICD-10-CM | POA: Diagnosis not present

## 2023-03-23 DIAGNOSIS — N760 Acute vaginitis: Secondary | ICD-10-CM | POA: Diagnosis not present

## 2023-04-09 ENCOUNTER — Ambulatory Visit: Payer: Medicaid Other | Admitting: Family

## 2023-04-09 ENCOUNTER — Encounter: Payer: Self-pay | Admitting: Family

## 2023-04-09 VITALS — BP 110/78 | HR 74 | Temp 97.4°F | Ht 66.0 in | Wt 228.0 lb

## 2023-04-09 DIAGNOSIS — Z09 Encounter for follow-up examination after completed treatment for conditions other than malignant neoplasm: Secondary | ICD-10-CM

## 2023-04-09 DIAGNOSIS — J029 Acute pharyngitis, unspecified: Secondary | ICD-10-CM

## 2023-04-09 DIAGNOSIS — J020 Streptococcal pharyngitis: Secondary | ICD-10-CM | POA: Diagnosis not present

## 2023-04-09 DIAGNOSIS — B379 Candidiasis, unspecified: Secondary | ICD-10-CM | POA: Diagnosis not present

## 2023-04-09 DIAGNOSIS — T3695XA Adverse effect of unspecified systemic antibiotic, initial encounter: Secondary | ICD-10-CM | POA: Diagnosis not present

## 2023-04-09 DIAGNOSIS — N898 Other specified noninflammatory disorders of vagina: Secondary | ICD-10-CM

## 2023-04-09 LAB — WET PREP FOR TRICH, YEAST, CLUE
Clue Cell Exam: NEGATIVE
Trichomonas Exam: NEGATIVE
Yeast Exam: POSITIVE — AB

## 2023-04-09 LAB — RAPID STREP SCREEN (MED CTR MEBANE ONLY): Strep Gp A Ag, IA W/Reflex: POSITIVE — AB

## 2023-04-09 MED ORDER — FLUCONAZOLE 150 MG PO TABS: 150.0000 mg | ORAL_TABLET | ORAL | 0 refills | Status: DC | PRN

## 2023-04-09 MED ORDER — AMOXICILLIN 500 MG PO CAPS: 500.0000 mg | ORAL_CAPSULE | Freq: Two times a day (BID) | ORAL | 0 refills | Status: AC

## 2023-04-09 NOTE — Progress Notes (Signed)
Subjective:    Patient ID: Kristin Saunders, female    DOB: [birth year], 32 y.o.   MRN: [id number]  Chief Complaint  Patient presents with   ER follow up     Stomach pain all over and still itching    Sore Throat    Daughters + strep and she has had sore throat 5 days    PT presents to the office today for hospital follow up. She went to the ED on 03/23/23 and was diagnosed with BV and given Flagyl BID for 7 days. She has completed this.   She reports both her daughters have strep and want to be tested.  Sore Throat  This is a new problem. The current episode started in the past 7 days. The problem has been unchanged. There has been no fever. The pain is at a severity of 8/10. The pain is mild. Associated symptoms include ear pain and headaches. Pertinent negatives include no congestion, coughing, ear discharge, shortness of breath or trouble swallowing. She has had exposure to strep. She has tried acetaminophen for the symptoms. The treatment provided mild relief.  Vaginal Itching The patient's primary symptoms include genital itching. The patient's pertinent negatives include no genital odor, vaginal bleeding or vaginal discharge. Associated symptoms include headaches.      Review of Systems  HENT:  Positive for ear pain. Negative for congestion, ear discharge and trouble swallowing.   Respiratory:  Negative for cough and shortness of breath.   Genitourinary:  Negative for vaginal discharge.  Neurological:  Positive for headaches.  All other systems reviewed and are negative.      Objective:   Physical Exam Vitals reviewed.  Constitutional:      General: She is not in acute distress.    Appearance: She is well-developed. She is obese.  HENT:     Head: Normocephalic and atraumatic.     Right Ear: External ear normal.     Mouth/Throat:     Pharynx: Posterior oropharyngeal erythema present.  Eyes:     Pupils: Pupils are equal, round, and reactive to light.  Neck:      Thyroid: No thyromegaly.  Cardiovascular:     Rate and Rhythm: Normal rate and regular rhythm.     Heart sounds: Normal heart sounds. No murmur heard. Pulmonary:     Effort: Pulmonary effort is normal. No respiratory distress.     Breath sounds: Normal breath sounds. No wheezing.  Abdominal:     General: Bowel sounds are normal. There is no distension.     Palpations: Abdomen is soft.     Tenderness: There is no abdominal tenderness.  Musculoskeletal:        General: No tenderness. Normal range of motion.     Cervical back: Normal range of motion and neck supple.  Skin:    General: Skin is warm and dry.  Neurological:     Mental Status: She is alert and oriented to person, place, and time.     Cranial Nerves: No cranial nerve deficit.     Deep Tendon Reflexes: Reflexes are normal and symmetric.  Psychiatric:        Behavior: Behavior normal.        Thought Content: Thought content normal.        Judgment: Judgment normal.       BP 110/78   Pulse 74   Temp (!) 97.4 F (36.3 C) (Temporal)   Ht 5\' 6"  (1.676 m)   Wt 228 lb (103.4  kg)   SpO2 95%   BMI 36.80 kg/m      Assessment & Plan:  [name] comes in today with chief complaint of ER follow up  (Stomach pain all over and still itching ) and Sore Throat (Daughters + strep and she has had sore throat 5 days )   Diagnosis and orders addressed:  1. Sore thro - Rapid Strep Screen ([facility] ONLY)  2. Vagina itching - WET PREP FOR TRICH, YEAST, CLUE  3. Strep pharyngitis - Take meds as prescribed - Use a cool mist humidifier  -Use saline nose sprays frequently -Force fluids -For any cough or congestion  Use plain Mucinex- regular strength or max strength is fine -For fever or aces or pains- take tylenol or ibuprofen. -Throat lozenges if help -New toothbrush in 3 days Follow up if symptoms worsen or do not improve  - amoxicillin (AMOXIL) 500 MG capsule; Take 1 capsule (500 mg total) by mouth 2 (two)  times daily for 10 days.  Dispense: 20 capsule; Refill: 0  4. Antibiotic-induced yeast infection Start diflucan after amoxicillin  - fluconazole (DIFLUCAN) 150 MG tablet; Take 1 tablet (150 mg total) by mouth every three (3) days as needed.  Dispense: 3 tablet; Refill: 0  5. Hospital discharge follow-up -Hospital notes reviewed  Continue current medications   [name], FNP

## 2023-04-09 NOTE — Patient Instructions (Signed)

## 2023-04-13 DIAGNOSIS — Z8709 Personal history of other diseases of the respiratory system: Secondary | ICD-10-CM | POA: Diagnosis not present

## 2023-04-13 DIAGNOSIS — Z20822 Contact with and (suspected) exposure to covid-19: Secondary | ICD-10-CM | POA: Diagnosis not present

## 2023-04-13 DIAGNOSIS — R0981 Nasal congestion: Secondary | ICD-10-CM | POA: Diagnosis not present

## 2023-04-16 ENCOUNTER — Ambulatory Visit: Payer: Medicaid Other | Admitting: Family

## 2023-04-26 ENCOUNTER — Ambulatory Visit: Payer: Medicaid Other | Admitting: Orthopedic Surgery

## 2023-04-26 ENCOUNTER — Other Ambulatory Visit (INDEPENDENT_AMBULATORY_CARE_PROVIDER_SITE_OTHER): Payer: Self-pay

## 2023-04-26 ENCOUNTER — Other Ambulatory Visit (INDEPENDENT_AMBULATORY_CARE_PROVIDER_SITE_OTHER): Payer: Medicaid Other

## 2023-04-26 DIAGNOSIS — Z981 Arthrodesis status: Secondary | ICD-10-CM | POA: Diagnosis not present

## 2023-04-26 MED ORDER — CYCLOBENZAPRINE HCL 10 MG PO TABS: 10.0000 mg | ORAL_TABLET | Freq: Three times a day (TID) | ORAL | 0 refills | Status: DC | PRN

## 2023-04-26 MED ORDER — METHYLPREDNISOLONE 4 MG PO TBPK: ORAL_TABLET | ORAL | 0 refills | Status: DC

## 2023-04-26 NOTE — Progress Notes (Signed)
Orthopedic Spine Surgery Office Note   Assessment: Patient is a 32 y.o. female status post L5/S1 decompression and TLIF.  Also status post C3-5 ACDF and C5-6 disc arthroplasty. Has developed pain in her upper lumbar spine that radiates to the lower abdominal wall     Plan: -Out of bed as tolerated, no brace needed -Activity as tolerated -Prescribed Medrol Dosepak and Flexeril for pain relief.  Can continue use Tylenol -Patient said she will call the office about her upper lumbar pain to let me know if the medications helped or did not and from there we can decide what to do next -Patient will return to the office in 6 months, x-rays at next visit: AP/lateral/flex/ex cervical and lumbar     Patient expressed understanding of the plan and all questions were answered to the patient's satisfaction.    ___________________________________________________________________________     History:   Patient is a 32 y.o. female who presents today for post-op follow up from my retired partner, Dr. Otelia Sergeant.  Patient had undergone C3-5 ACDF and C5-6 disc arthroplasty along with L5/S1 decompression and TLIF with Dr. Otelia Sergeant.  Patient has been doing well since she was last seen.  She is not having any pain radiating into her upper extremities.  She is also not having any pain radiating into her lower extremities.  She has developed pain within the last 4 days that starts in her upper lumbar spine and radiates into the lower abdominal wall.  She states there is no trauma or injury that preceded the onset of this pain.Pain has been stable since onset.     Physical Exam:   General: no acute distress, appears stated age Neurologic: alert, answering questions appropriately, following commands Respiratory: unlabored breathing on room air, symmetric chest rise Psychiatric: appropriate affect, normal cadence to speech    MSK (spine):   -Strength exam                                                   Left                   Right Grip strength                5/5                  5/5 Interosseus                  5/5                  5/5 Wrist extension            5/5                  5/5 Wrist flexion                 5/5                  5/5 Elbow flexion                5/5                  5/5 Deltoid                          5/5  5/5   EHL                              5/5                  5/5 TA                                 5/5                  5/5 GSC                             5/5                  5/5 Knee extension            5/5                  5/5 Hip flexion                    5/5                  5/5   -Sensory exam                           Sensation intact to light touch in L3-S1 nerve distributions of bilateral lower extremities             Sensation intact to light touch in C5-T1 nerve distributions of bilateral upper extremities   -Hoffman sign: negative bilaterally -Clonus: no beats bilaterally -Interosseous wasting: none seen -Gait: normal   Imaging: XRs of the cervical spine from 04/26/2023 were independently reviewed and interpreted, showing ACDF from C3-5 and disc arthroplasty C5/6. There is no lucency around the screws. There appears to be fusion mass across the former C3-5 disc spaces. There is bone formation anterior to the disc arthroplasty at C5/6 that appears unchanged from prior films.  No fracture or dislocation seen.   XRs of the lumbar spine from 04/26/2023 were independently reviewed and interpreted, showing posterior instrumentation at L5 and S1 that appears in stable position.  Very subtle lucency is seen inferior to the L5 screws on the flexion view.  No other lucency seen.  Screws of not backed out.  Interbody device at L5/S1 that appears in appropriate position.  No lucency around the interbody.  L5/S1 laminectomy defect seen.  No fracture or dislocation.     Patient name: Kristin Saunders Patient MRN: [id number] Date of visit: 04/26/23

## 2023-05-02 DIAGNOSIS — R109 Unspecified abdominal pain: Secondary | ICD-10-CM | POA: Diagnosis not present

## 2023-05-02 DIAGNOSIS — N134 Hydroureter: Secondary | ICD-10-CM | POA: Diagnosis not present

## 2023-05-02 DIAGNOSIS — Z20822 Contact with and (suspected) exposure to covid-19: Secondary | ICD-10-CM | POA: Diagnosis not present

## 2023-05-02 DIAGNOSIS — R070 Pain in throat: Secondary | ICD-10-CM | POA: Diagnosis not present

## 2023-05-02 DIAGNOSIS — E860 Dehydration: Secondary | ICD-10-CM | POA: Diagnosis not present

## 2023-05-02 DIAGNOSIS — N132 Hydronephrosis with renal and ureteral calculous obstruction: Secondary | ICD-10-CM | POA: Diagnosis not present

## 2023-05-02 DIAGNOSIS — E861 Hypovolemia: Secondary | ICD-10-CM | POA: Diagnosis not present

## 2023-05-02 DIAGNOSIS — R1110 Vomiting, unspecified: Secondary | ICD-10-CM | POA: Diagnosis not present

## 2023-05-02 DIAGNOSIS — N10 Acute tubulo-interstitial nephritis: Secondary | ICD-10-CM | POA: Diagnosis not present

## 2023-05-02 DIAGNOSIS — N201 Calculus of ureter: Secondary | ICD-10-CM | POA: Diagnosis not present

## 2023-05-03 DIAGNOSIS — Z87442 Personal history of urinary calculi: Secondary | ICD-10-CM | POA: Diagnosis not present

## 2023-05-03 DIAGNOSIS — N201 Calculus of ureter: Secondary | ICD-10-CM | POA: Diagnosis not present

## 2023-05-03 DIAGNOSIS — Q6261 Deviation of ureter: Secondary | ICD-10-CM | POA: Diagnosis not present

## 2023-05-04 DIAGNOSIS — N201 Calculus of ureter: Secondary | ICD-10-CM | POA: Diagnosis not present

## 2023-05-08 DIAGNOSIS — N201 Calculus of ureter: Secondary | ICD-10-CM | POA: Diagnosis not present

## 2023-06-08 DIAGNOSIS — N136 Pyonephrosis: Secondary | ICD-10-CM | POA: Diagnosis not present

## 2023-06-08 DIAGNOSIS — T83593A Infection and inflammatory reaction due to other urinary stents, initial encounter: Secondary | ICD-10-CM | POA: Diagnosis not present

## 2023-06-08 DIAGNOSIS — R300 Dysuria: Secondary | ICD-10-CM | POA: Diagnosis not present

## 2023-06-08 DIAGNOSIS — Z981 Arthrodesis status: Secondary | ICD-10-CM | POA: Diagnosis not present

## 2023-06-08 DIAGNOSIS — N390 Urinary tract infection, site not specified: Secondary | ICD-10-CM | POA: Diagnosis not present

## 2023-06-09 DIAGNOSIS — Z8619 Personal history of other infectious and parasitic diseases: Secondary | ICD-10-CM | POA: Diagnosis not present

## 2023-06-09 DIAGNOSIS — N1330 Unspecified hydronephrosis: Secondary | ICD-10-CM | POA: Diagnosis not present

## 2023-06-09 DIAGNOSIS — T83593A Infection and inflammatory reaction due to other urinary stents, initial encounter: Secondary | ICD-10-CM | POA: Diagnosis not present

## 2023-06-10 DIAGNOSIS — T83592A Infection and inflammatory reaction due to indwelling ureteral stent, initial encounter: Secondary | ICD-10-CM | POA: Diagnosis not present

## 2023-06-11 DIAGNOSIS — T83593A Infection and inflammatory reaction due to other urinary stents, initial encounter: Secondary | ICD-10-CM | POA: Diagnosis not present

## 2023-06-11 DIAGNOSIS — T83592A Infection and inflammatory reaction due to indwelling ureteral stent, initial encounter: Secondary | ICD-10-CM | POA: Diagnosis not present

## 2023-06-12 DIAGNOSIS — N201 Calculus of ureter: Secondary | ICD-10-CM | POA: Diagnosis not present

## 2023-06-13 ENCOUNTER — Telehealth: Payer: Self-pay

## 2023-06-13 NOTE — Transitions of Care (Post Inpatient/ED Visit) (Signed)
06/13/2023  Name: Kristin Saunders MRN: [id number] DOB: 1991/02/21  Today's TOC FU Call Status: Today's TOC FU Call Status:: Successful TOC FU Call Completed TOC FU Call Complete Date: 06/13/23 Patient's Name and Date of Birth confirmed.  Transition Care Management Follow-up Telephone Call Date of Discharge: 06/11/23 Discharge Facility: Other [facility]) Name of Other (Non-[facility]) Discharge Facility: [facility] Type of Discharge: Inpatient Admission Primary Inpatient Discharge Diagnosis:: Infection Associated With Urinary Stent How have you been since you were released from the hospital?: Better (Patient states she is so much better!) Any questions or concerns?: No  Items Reviewed: Did you receive and understand the discharge instructions provided?: Yes Medications obtained,verified, and reconciled?: Yes (Medications Reviewed) Any new allergies since your discharge?: No Dietary orders reviewed?: Yes Type of Diet Ordered:: Regular Do you have support at home?: Yes People in Home: parent(s) Name of Support/Comfort Primary Source: Viviann Spare  Medications Reviewed Today: Medications Reviewed Today     Reviewed by Jodelle Gross, RN (Case Manager) on 06/13/23 at 1337  Med List Status: <None>   Medication Order Taking? Sig Documenting Provider Last Dose Status Informant  cyclobenzaprine (FLEXERIL) 10 MG tablet [id number] Yes Take 1 tablet (10 mg total) by mouth 3 (three) times daily as needed (pain, muscle spasms). London Sheer, MD Taking Active   fluconazole (DIFLUCAN) 150 MG tablet [id number] Yes Take 1 tablet (150 mg total) by mouth every three (3) days as needed. Junie Spencer, FNP Taking Active   methylPREDNISolone (MEDROL DOSEPAK) 4 MG TBPK tablet [id number] Yes Take as prescribed on the box London Sheer, MD Taking Active   oxybutynin (DITROPAN-XL) 10 MG 24 hr tablet [id number] Yes Take 1 tablet by mouth daily. [provider] Taking Active              Home Care and Equipment/Supplies: Were Home Health Services Ordered?: No Any new equipment or medical supplies ordered?: No  Functional Questionnaire: Do you need assistance with bathing/showering or dressing?: No Do you need assistance with meal preparation?: No Do you need assistance with eating?: No Do you have difficulty maintaining continence: No Do you need assistance with getting out of bed/getting out of a chair/moving?: No Do you have difficulty managing or taking your medications?: No  Follow up appointments reviewed: PCP Follow-up appointment confirmed?: Yes Date of PCP follow-up appointment?: 06/14/23 Follow-up Provider: [facility] Follow-up appointment confirmed?: Yes Date of Specialist follow-up appointment?: 06/12/23 Follow-Up Specialty Provider:: Dr. Myles Lipps Do you need transportation to your follow-up appointment?: No Do you understand care options if your condition(s) worsen?: Yes-patient verbalized understanding  SDOH Interventions Today    Flowsheet Row Most Recent Value  SDOH Interventions   Food Insecurity Interventions Intervention Not Indicated  Transportation Interventions Intervention Not Indicated      Jodelle Gross RN, BSN, CCM RN Care Manager  Transitions of Care  [facility]  [phone]

## 2023-06-14 ENCOUNTER — Ambulatory Visit (INDEPENDENT_AMBULATORY_CARE_PROVIDER_SITE_OTHER): Payer: Medicaid Other

## 2023-06-14 ENCOUNTER — Encounter: Payer: Self-pay | Admitting: Family Medicine

## 2023-06-14 VITALS — BP 99/69 | HR 78 | Temp 98.3°F | Ht 66.0 in | Wt 232.0 lb

## 2023-06-14 DIAGNOSIS — Z1331 Encounter for screening for depression: Secondary | ICD-10-CM | POA: Diagnosis not present

## 2023-06-14 DIAGNOSIS — Z87442 Personal history of urinary calculi: Secondary | ICD-10-CM

## 2023-06-14 DIAGNOSIS — Z9889 Other specified postprocedural states: Secondary | ICD-10-CM

## 2023-06-14 NOTE — Progress Notes (Signed)
Subjective:  Patient ID: Kristin Saunders, female    DOB: 05/29/1991, 32 y.o.   MRN: [id number]  Patient Care Team: Junie Spencer, FNP as PCP - General (Nurse Practitioner)   Chief Complaint:  Hospitalization Follow-up (Urinary tract infection/)   HPI: Kristin Saunders is a 32 y.o. female presenting on 06/14/2023 for Hospitalization Follow-up (Urinary tract infection/) Patient presented to [facility] on 05/02/2023 with acute pain in RUQ, RLQ, and flank pain. A CT of Abd/Pelvis revealed 5 mm stone at the right UVJ and area suspicious for pyelonephritis. She received antibiotics and right ureteral stent.  States that she was discharged to home with planned ESWL of right ureteral stone on 05/08/23, which was completed. Ureteral stent removed Tuesday 06/12/23. On repeat imaging of CT and KUB there was not stone present.  States that she is doing a lot better. Denies pain with urination, back pain. Denies NVD, fever. Has follow up planned with urology in January with renal ultrasound planned.  Labs collected on 06/11/23.   Relevant past medical, surgical, family, and social history reviewed and updated as indicated.  Allergies and medications reviewed and updated. Data reviewed: Chart in Epic.   Past Medical History:  Diagnosis Date   Anxiety    Arthritis    Depression    GERD (gastroesophageal reflux disease)    Headache    Medical history non-contributory     Past Surgical History:  Procedure Laterality Date   ANTERIOR CERVICAL DECOMP/DISCECTOMY FUSION N/A 02/07/2022   Procedure: ANTERIOR CERVICAL DISCECTOMY AND  FUSION C3-4,  C4-5 WITH PLATE, SCREWS, LOCAL BONE GRAFT AND VIVIGEN;  Surgeon: Kerrin Champagne, MD;  Location: [facility] OR;  Service: Orthopedics;  Laterality: N/A;   CERVICAL DISC ARTHROPLASTY N/A 02/07/2022   Procedure: TOTAL CERVICAL DISC ARTHROPLASTY C5-6;  Surgeon: Kerrin Champagne, MD;  Location: [facility] OR;  Service: Orthopedics;  Laterality: N/A;   NO PAST SURGERIES     TUBAL  LIGATION      Social History   Socioeconomic History   Marital status: Single    Spouse name: Not on file   Number of children: 2   Years of education: Not on file   Highest education level: Not on file  Occupational History   Not on file  Tobacco Use   Smoking status: Never   Smokeless tobacco: Never  Vaping Use   Vaping status: Never Used  Substance and Sexual Activity   Alcohol use: Not Currently   Drug use: No   Sexual activity: Yes    Birth control/protection: None, Surgical  Other Topics Concern   Not on file  Social History Narrative   Not on file   Social Determinants of Health   Financial Resource Strain: Not on file  Food Insecurity: No Food Insecurity (06/13/2023)   Hunger Vital Sign    Worried About Running Out of Food in the Last Year: Never true    Ran Out of Food in the Last Year: Never true  Transportation Needs: No Transportation Needs (06/13/2023)   PRAPARE - Administrator, Civil Service (Medical): No    Lack of Transportation (Non-Medical): No  Physical Activity: Not on file  Stress: Not on file  Social Connections: Unknown (12/19/2021)   [facility] from [facility]   Social Network    Social Network: Not on file  Intimate Partner Violence: Unknown (11/25/2021)   Received from [facility]   HITS    Physically Hurt: Not  on file    Insult or Talk Down To: Not on file    Threaten Physical Harm: Not on file    Scream or Curse: Not on file    Outpatient Encounter Medications as of 06/14/2023  Medication Sig   [DISCONTINUED] cyclobenzaprine (FLEXERIL) 10 MG tablet Take 1 tablet (10 mg total) by mouth 3 (three) times daily as needed (pain, muscle spasms).   [DISCONTINUED] fluconazole (DIFLUCAN) 150 MG tablet Take 1 tablet (150 mg total) by mouth every three (3) days as needed.   [DISCONTINUED] methylPREDNISolone (MEDROL DOSEPAK) 4 MG TBPK tablet Take as prescribed on the box   [DISCONTINUED] oxybutynin  (DITROPAN-XL) 10 MG 24 hr tablet Take 1 tablet by mouth daily.   No facility-administered encounter medications on file as of 06/14/2023.    No Known Allergies  Review of Systems As per HPI  Objective:  BP 99/69   Pulse 78   Temp 98.3 F (36.8 C)   Ht 5\' 6"  (1.676 m)   Wt 232 lb (105.2 kg)   LMP 06/06/2023 (Exact Date)   SpO2 96%   BMI 37.45 kg/m    Wt Readings from Last 3 Encounters:  06/14/23 232 lb (105.2 kg)  04/09/23 228 lb (103.4 kg)  10/17/22 222 lb (100.7 kg)    Physical Exam Constitutional:      General: She is awake. She is not in acute distress.    Appearance: Normal appearance. She is well-developed and well-groomed. She is obese. She is not ill-appearing, toxic-appearing or diaphoretic.  Cardiovascular:     Rate and Rhythm: Normal rate and regular rhythm.     Pulses: Normal pulses.          Radial pulses are 2+ on the right side and 2+ on the left side.       Posterior tibial pulses are 2+ on the right side and 2+ on the left side.     Heart sounds: Normal heart sounds. No murmur heard.    No gallop.  Pulmonary:     Effort: Pulmonary effort is normal. No respiratory distress.     Breath sounds: Normal breath sounds. No stridor. No wheezing, rhonchi or rales.  Abdominal:     General: Abdomen is flat. Bowel sounds are normal. There is no distension.     Palpations: Abdomen is soft. There is no mass.     Tenderness: There is no abdominal tenderness. There is no right CVA tenderness, left CVA tenderness, guarding or rebound.     Hernia: No hernia is present.  Musculoskeletal:     Cervical back: Full passive range of motion without pain and neck supple.     Right lower leg: No edema.     Left lower leg: No edema.  Skin:    General: Skin is warm.     Capillary Refill: Capillary refill takes less than 2 seconds.  Neurological:     General: No focal deficit present.     Mental Status: She is alert, oriented to person, place, and time and easily aroused.  Mental status is at baseline.     GCS: GCS eye subscore is 4. GCS verbal subscore is 5. GCS motor subscore is 6.     Motor: No weakness.  Psychiatric:        Attention and Perception: Attention and perception normal.        Mood and Affect: Mood and affect normal.        Speech: Speech normal.  Behavior: Behavior normal. Behavior is cooperative.        Thought Content: Thought content normal. Thought content does not include homicidal or suicidal ideation. Thought content does not include homicidal or suicidal plan.        Cognition and Memory: Cognition and memory normal.        Judgment: Judgment normal.     Results for orders placed or performed in visit on 04/09/23  Rapid Strep Screen ([facility] ONLY)   Specimen: Other   Other  Result Value Ref Range   Strep Gp A Ag, IA W/Reflex Positive (A) Negative  WET PREP FOR TRICH, YEAST, CLUE   Specimen: Vaginal Swab   Vaginal Swab  Result Value Ref Range   Trichomonas Exam Negative Negative   Yeast Exam Positive (A) Negative   Clue Cell Exam Negative Negative       06/14/2023   10:23 AM 10/17/2022    3:41 PM 04/13/2022    1:44 PM 10/17/2021   12:35 PM 03/23/2020   11:21 AM  Depression screen PHQ 2/9  Decreased Interest 3 0 0 3 3  Down, Depressed, Hopeless 3 0 0 0 3  PHQ - 2 Score 6 0 0 3 6  Altered sleeping 3 0 2 3 3   Tired, decreased energy 3 2 0 3 3  Change in appetite 0 0 0  0  Feeling bad or failure about yourself  0 0 0 0 0  Trouble concentrating 0 0 0 0 2  Moving slowly or fidgety/restless 0 0 0 3 1  Suicidal thoughts 0 0 0 0 0  PHQ-9 Score 12 2 2 12 15   Difficult doing work/chores Very difficult Somewhat difficult Very difficult Very difficult        06/14/2023   10:24 AM 10/17/2022    3:41 PM 04/13/2022    1:45 PM 10/17/2021   12:36 PM  GAD 7 : Generalized Anxiety Score  Nervous, Anxious, on Edge 0 0 0 2  Control/stop worrying 3 0 3 2  Worry too much - different things 3 3 3 3   Trouble relaxing 3 3 3  3   Restless 2 0 2 3  Easily annoyed or irritable 3 0 1 2  Afraid - awful might happen 3 0 0 0  Total GAD 7 Score 17 6 12 15   Anxiety Difficulty Very difficult Somewhat difficult Very difficult Somewhat difficult    Pertinent labs & imaging results that were available during my care of the patient were reviewed by me and considered in my medical decision making.  Assessment & Plan:  Hilal was seen today for hospitalization follow-up.  Diagnoses and all orders for this visit:  History of urinary stone Patient doing well. Denies any symptoms. Labs on 05/2123 unremarkable, will not complete additional labs at this time. Reviewed multiple admission and procedure notes from 05/02/23 -06/12/23 in Care Everywhere from [facility]. Plan for patient to monitor for increased symptoms at home. Patient to follow up with urology and complete renal ultrasound.   History of removal of ureteral stent As above.   Encounter for screening for depression Pt screened positive for depression and anxiety today. Pt offered nonpharmacologic and pharmacologic therapy. Pt declined initiating treatment at this time. Safety contract established today with patient in clinic. Denies intent to harm herself or others. Pt to notify provider if they would like to initiate treatment.     Continue all other maintenance medications.  Follow up plan: Return if symptoms worsen or  fail to improve.   Continue healthy lifestyle choices, including diet (rich in fruits, vegetables, and lean proteins, and low in salt and simple carbohydrates) and exercise (at least 30 minutes of moderate physical activity daily).  Written and verbal instructions provided   The above assessment and management plan was discussed with the patient. The patient verbalized understanding of and has agreed to the management plan. Patient is aware to call the clinic if they develop any new symptoms or if symptoms persist or worsen. Patient is aware  when to return to the clinic for a follow-up visit. Patient educated on when it is appropriate to go to the emergency department.   Neale Burly, DNP-FNP [facility] [address] [place], [place] [address] [phone]

## 2023-06-20 ENCOUNTER — Telehealth: Payer: Medicaid Other | Admitting: Nurse Practitioner

## 2023-06-20 ENCOUNTER — Encounter: Payer: Self-pay | Admitting: Nurse Practitioner

## 2023-06-20 DIAGNOSIS — R300 Dysuria: Secondary | ICD-10-CM | POA: Diagnosis not present

## 2023-06-20 DIAGNOSIS — N3000 Acute cystitis without hematuria: Secondary | ICD-10-CM

## 2023-06-20 LAB — MICROSCOPIC EXAMINATION
RBC, Urine: NONE SEEN /[HPF] (ref 0–2)
Renal Epithel, UA: NONE SEEN /[HPF]
Yeast, UA: NONE SEEN

## 2023-06-20 LAB — URINALYSIS, ROUTINE W REFLEX MICROSCOPIC
Bilirubin, UA: NEGATIVE
Glucose, UA: NEGATIVE
Ketones, UA: NEGATIVE
Leukocytes,UA: NEGATIVE
Nitrite, UA: NEGATIVE
Protein,UA: NEGATIVE
RBC, UA: NEGATIVE
Specific Gravity, UA: 1.02 (ref 1.005–1.030)
Urobilinogen, Ur: 0.2 mg/dL (ref 0.2–1.0)
pH, UA: 7 (ref 5.0–7.5)

## 2023-06-20 MED ORDER — DOXYCYCLINE HYCLATE 100 MG PO CAPS: 100.0000 mg | ORAL_CAPSULE | Freq: Two times a day (BID) | ORAL | 0 refills | Status: DC

## 2023-06-20 NOTE — Progress Notes (Signed)
Virtual Visit via video Note Due to COVID-19 pandemic this visit was conducted virtually. This visit type was conducted due to national recommendations for restrictions regarding the COVID-19 Pandemic (e.g. social distancing, sheltering in place) in an effort to limit this patient's exposure and mitigate transmission in our community. All issues noted in this document were discussed and addressed.  A physical exam was not performed with this format.   I connected with Kristin Saunders on 06/20/2023 at 12:45 by name and DOB and verified that I am speaking with the correct person using two identifiers. Kristin Saunders is currently located at home and is currently with them during visit. The provider, Martina Sinner, DNP is located in their office at time of visit.  I discussed the limitations, risks, security and privacy concerns of performing an evaluation and management service by virtual visit and the availability of in person appointments. I also discussed with the patient that there may be a patient responsible charge related to this service. The patient expressed understanding and agreed to proceed.  Subjective:  Patient ID: Kristin Saunders, female    DOB: 01-Dec-1990, 32 y.o.   MRN: [id number]  Chief Complaint:  Urinary Tract Infection   HPI: Kristin Saunders is a 32 y.o. female presenting on 06/20/2023 for Urinary Tract Infection   HPI Kristin Saunders is a 32 y.o. female who complains of urinary frequency, urgency and dysuria x 3 days, "They removed a stent and it feels like the infection is coming back" Denies  flank pain, fever, chills, or abnormal vaginal discharge or bleeding. She will urine sample to the lab  Relevant past medical, surgical, family, and social history reviewed and updated as indicated.  Allergies and medications reviewed and updated.   Past Medical History:  Diagnosis Date   Anxiety    Arthritis    Depression    GERD (gastroesophageal reflux disease)     Headache    Medical history non-contributory     Past Surgical History:  Procedure Laterality Date   ANTERIOR CERVICAL DECOMP/DISCECTOMY FUSION N/A 02/07/2022   Procedure: ANTERIOR CERVICAL DISCECTOMY AND  FUSION C3-4,  C4-5 WITH PLATE, SCREWS, LOCAL BONE GRAFT AND VIVIGEN;  Surgeon: Kerrin Champagne, MD;  Location: [facility] OR;  Service: Orthopedics;  Laterality: N/A;   CERVICAL DISC ARTHROPLASTY N/A 02/07/2022   Procedure: TOTAL CERVICAL DISC ARTHROPLASTY C5-6;  Surgeon: Kerrin Champagne, MD;  Location: [facility] OR;  Service: Orthopedics;  Laterality: N/A;   NO PAST SURGERIES     TUBAL LIGATION      Social History   Socioeconomic History   Marital status: Single    Spouse name: Not on file   Number of children: 2   Years of education: Not on file   Highest education level: Not on file  Occupational History   Not on file  Tobacco Use   Smoking status: Never   Smokeless tobacco: Never  Vaping Use   Vaping status: Never Used  Substance and Sexual Activity   Alcohol use: Not Currently   Drug use: No   Sexual activity: Yes    Birth control/protection: None, Surgical  Other Topics Concern   Not on file  Social History Narrative   Not on file   Social Determinants of Health   Financial Resource Strain: Not on file  Food Insecurity: No Food Insecurity (06/13/2023)   Hunger Vital Sign    Worried About Running Out of Food in the Last Year: Never true    Ran Out  of Food in the Last Year: Never true  Transportation Needs: No Transportation Needs (06/13/2023)   PRAPARE - Administrator, Civil Service (Medical): No    Lack of Transportation (Non-Medical): No  Physical Activity: Not on file  Stress: Not on file  Social Connections: Unknown (06/19/2023)   Received from [facility]   Social Network    Social Network: Not on file  Intimate Partner Violence: Unknown (06/19/2023)   Received from [facility]   HITS    Physically Hurt: Not on file    Insult or Talk Down To: Not  on file    Threaten Physical Harm: Not on file    Scream or Curse: Not on file    No outpatient encounter medications on file as of 06/20/2023.   No facility-administered encounter medications on file as of 06/20/2023.    No Known Allergies  Review of Systems  Constitutional:  Negative for chills and fever.  HENT:  Negative for congestion and rhinorrhea.   Cardiovascular:  Negative for chest pain and leg swelling.  Genitourinary:  Positive for dysuria, frequency and hematuria.    Observations/Objective: No vital signs or physical exam, this was a virtual health encounter.  Pt alert and oriented, answers all questions appropriately, and able to speak in full sentences.  OBJECTIVE:   without tenderness, guarding, mass, rebound or organomegaly. No CVA tenderness or inguinal adenopathy noted. Urine dipstick shows positive for nitrates and positive for leukocytes.  Micro exam: 0-5 WBC's per HPF, few+ bacteria, and mucus thread .   Assessment and Plan: Rashel was seen today for urinary tract infection.  Diagnoses and all orders for this visit:  Dysuria -     Urinalysis, Routine w reflex microscopic -     Urine Culture  Acute cystitis without hematuria -     Urine Culture  Other orders -     Microscopic Examination     Follow Up Instructions: Return if symptoms worsen or fail to improve.    ASSESSMENT: UTI uncomplicated without evidence of pyelonephritis Doxy BID fro 7 days, urine culture ordered  PLAN: Treatment per orders - also push fluids, may use Pyridium OTC prn. Call or return to clinic prn if these symptoms worsen or fail to improve as anticipated.   Relevant past medical, surgical, family, and social history reviewed and updated as indicated.  Allergies and medications reviewed and updated. I discussed the assessment and treatment plan with the patient. The patient was provided an opportunity to ask questions and all were answered. The patient agreed with the  plan and demonstrated an understanding of the instructions.   The patient was advised to call back or seek an in-person evaluation if the symptoms worsen or if the condition fails to improve as anticipated.  The above assessment and management plan was discussed with the patient. The patient verbalized understanding of and has agreed to the management plan. Patient is aware to call the clinic if they develop any new symptoms or if symptoms persist or worsen. Patient is aware when to return to the clinic for a follow-up visit. Patient educated on when it is appropriate to go to the emergency department.    I provided 10 minutes of time during this video encounter.   Arrie Aran Santa Lighter, DNP [facility] [address] [place], [place] [address] [phone] 06/20/2023

## 2023-06-24 LAB — URINE CULTURE

## 2023-06-25 DIAGNOSIS — R519 Headache, unspecified: Secondary | ICD-10-CM | POA: Diagnosis not present

## 2023-06-25 DIAGNOSIS — R070 Pain in throat: Secondary | ICD-10-CM | POA: Diagnosis not present

## 2023-06-26 ENCOUNTER — Telehealth: Payer: Medicaid Other | Admitting: Family

## 2023-06-26 ENCOUNTER — Encounter: Payer: Self-pay | Admitting: Family

## 2023-06-26 DIAGNOSIS — J069 Acute upper respiratory infection, unspecified: Secondary | ICD-10-CM | POA: Diagnosis not present

## 2023-06-26 DIAGNOSIS — J029 Acute pharyngitis, unspecified: Secondary | ICD-10-CM | POA: Diagnosis not present

## 2023-06-26 MED ORDER — CETIRIZINE HCL 10 MG PO TABS: 10.0000 mg | ORAL_TABLET | Freq: Every day | ORAL | 1 refills | Status: DC

## 2023-06-26 MED ORDER — BENZONATATE 200 MG PO CAPS: 200.0000 mg | ORAL_CAPSULE | Freq: Three times a day (TID) | ORAL | 1 refills | Status: DC | PRN

## 2023-06-26 MED ORDER — FLUTICASONE PROPIONATE 50 MCG/ACT NA SUSP: 2.0000 | Freq: Every day | NASAL | 6 refills | Status: DC

## 2023-06-26 NOTE — Progress Notes (Signed)
Virtual Visit Consent   Kristin Saunders, you are scheduled for a virtual visit with a [facility] provider today. Just as with appointments in the office, your consent must be obtained to participate. Your consent will be active for this visit and any virtual visit you may have with one of our providers in the next 365 days. If you have a MyChart account, a copy of this consent can be sent to you electronically.  As this is a virtual visit, video technology does not allow for your provider to perform a traditional examination. This may limit your provider's ability to fully assess your condition. If your provider identifies any concerns that need to be evaluated in person or the need to arrange testing (such as labs, EKG, etc.), we will make arrangements to do so. Although advances in technology are sophisticated, we cannot ensure that it will always work on either your end or our end. If the connection with a video visit is poor, the visit may have to be switched to a telephone visit. With either a video or telephone visit, we are not always able to ensure that we have a secure connection.  By engaging in this virtual visit, you consent to the provision of healthcare and authorize for your insurance to be billed (if applicable) for the services provided during this visit. Depending on your insurance coverage, you may receive a charge related to this service.  I need to obtain your verbal consent now. Are you willing to proceed with your visit today? Kristin Saunders has provided verbal consent on 06/26/2023 for a virtual visit (video or telephone). Kristin Rodney, FNP  Date: 06/26/2023 1:21 PM  Virtual Visit via Video Note   I, Kristin Saunders, connected with  Kristin Saunders  ([id number], September 18, 1990) on 06/26/23 at  3:55 PM EST by a video-enabled telemedicine application and verified that I am speaking with the correct person using two identifiers.  Location: Patient: Virtual Visit Location Patient:  Home Provider: Virtual Visit Location Provider:Office   I discussed the limitations of evaluation and management by telemedicine and the availability of in person appointments. The patient expressed understanding and agreed to proceed.    History of Present Illness: Kristin Saunders is a 32 y.o. who identifies as a female who was assigned female at birth, and is being seen today for sore throat.  HPI: Sore Throat  This is a new problem. The current episode started in the past 7 days. The problem has been gradually worsening. There has been no fever. The pain is at a severity of 6/10. The pain is mild. Associated symptoms include coughing, ear pain, headaches, swollen glands and trouble swallowing. Pertinent negatives include no congestion or shortness of breath. She has had no exposure to strep. She has tried acetaminophen for the symptoms. The treatment provided mild relief.    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   Obstructive sleep apnea of adult 05/13/2020   Spondylolisthesis at L5-S1 level 03/11/2019   Spondylolysis 03/11/2019   Chronic bilateral low back pain with bilateral sciatica 03/11/2019   Obesity (BMI 30.0-34.9) 03/19/2018    Allergies: No Known Allergies Medications:  Current Outpatient Medications:    benzonatate (TESSALON) 200  MG capsule, Take 1 capsule (200 mg total) by mouth 3 (three) times daily as needed., Disp: 30 capsule, Rfl: 1   cetirizine (ZYRTEC ALLERGY) 10 MG tablet, Take 1 tablet (10 mg total) by mouth daily., Disp: 90 tablet, Rfl: 1   fluticasone (FLONASE) 50 MCG/ACT nasal spray, Place 2 sprays into both nostrils daily., Disp: 16 g, Rfl: 6   doxycycline (VIBRAMYCIN)  100 MG capsule, Take 1 capsule (100 mg total) by mouth 2 (two) times daily., Disp: 14 capsule, Rfl: 0  Observations/Objective: Patient is well-developed, well-nourished in no acute distress.  Resting comfortably  at home.  Head is normocephalic, atraumatic.  No labored breathing.  Speech is clear and coherent with logical content.  Patient is alert and oriented at baseline.  Throat erythemas   Assessment and Plan: 1. Acute pharyngitis, unspecified etiology - benzonatate (TESSALON) 200 MG capsule; Take 1 capsule (200 mg total) by mouth 3 (three) times daily as needed.  Dispense: 30 capsule; Refill: 1 - fluticasone (FLONASE) 50 MCG/ACT nasal spray; Place 2 sprays into both nostrils daily.  Dispense: 16 g; Refill: 6 - cetirizine (ZYRTEC ALLERGY) 10 MG tablet; Take 1 tablet (10 mg total) by mouth daily.  Dispense: 90 tablet; Refill: 1  2. Viral URI - benzonatate (TESSALON) 200 MG capsule; Take 1 capsule (200 mg total) by mouth 3 (three) times daily as needed.  Dispense: 30 capsule; Refill: 1 - fluticasone (FLONASE) 50 MCG/ACT nasal spray; Place 2 sprays into both nostrils daily.  Dispense: 16 g; Refill: 6 - cetirizine (ZYRTEC ALLERGY) 10 MG tablet; Take 1 tablet (10 mg total) by mouth daily.  Dispense: 90 tablet; Refill: 1  - Take meds as prescribed - Use a cool mist humidifier  -Use saline nose sprays frequently -Force fluids -For any cough or congestion  Use plain Mucinex- regular strength or max strength is fine -For fever or aces or pains- take tylenol or ibuprofen. -Throat lozenges if help -New toothbrush in 3 days -Follow up if symptoms worsen or do not improve, if no improvement by Thursday or Friday will send in antibiotic   Follow Up Instructions: I discussed the assessment and treatment plan with the patient. The patient was provided an opportunity to ask questions and all were answered. The patient agreed with the plan and demonstrated an understanding of the instructions.  A  copy of instructions were sent to the patient via MyChart unless otherwise noted below.     The patient was advised to call back or seek an in-person evaluation if the symptoms worsen or if the condition fails to improve as anticipated.    Kristin Rodney, FNP

## 2023-06-27 ENCOUNTER — Other Ambulatory Visit: Payer: Self-pay | Admitting: Nurse Practitioner

## 2023-06-27 DIAGNOSIS — R300 Dysuria: Secondary | ICD-10-CM

## 2023-06-27 DIAGNOSIS — N3000 Acute cystitis without hematuria: Secondary | ICD-10-CM

## 2023-06-27 MED ORDER — NITROFURANTOIN MONOHYD MACRO 100 MG PO CAPS: 100.0000 mg | ORAL_CAPSULE | Freq: Two times a day (BID) | ORAL | 0 refills | Status: DC

## 2023-07-23 ENCOUNTER — Ambulatory Visit: Payer: Medicaid Other | Admitting: Family Medicine

## 2023-07-23 ENCOUNTER — Encounter: Payer: Self-pay | Admitting: Family Medicine

## 2023-07-23 VITALS — BP 109/70 | HR 104 | Temp 98.3°F | Ht 66.0 in | Wt 229.1 lb

## 2023-07-23 DIAGNOSIS — J069 Acute upper respiratory infection, unspecified: Secondary | ICD-10-CM

## 2023-07-23 LAB — VERITOR FLU A/B WAIVED
Influenza A: NEGATIVE
Influenza B: NEGATIVE

## 2023-07-23 NOTE — Progress Notes (Signed)
Acute Office Visit  Subjective:     Patient ID: Kristin Saunders, female    DOB: 07/06/1991, 32 y.o.   MRN: [id number]  Chief Complaint  Patient presents with   Cough    Cough This is a new problem. Episode onset: 2 days. The problem has been unchanged. The cough is Non-productive. Associated symptoms include chills, ear pain, a fever (max temp 100), headaches, myalgias and nasal congestion. Pertinent negatives include no chest pain, ear congestion, hemoptysis, sore throat, shortness of breath, sweats or wheezing. Treatments tried: dayquil, nyquil. The treatment provided mild relief. There is no history of asthma, bronchitis, COPD or pneumonia.   Sick contacts at work.   Review of Systems  Constitutional:  Positive for chills and fever (max temp 100).  HENT:  Positive for ear pain. Negative for sore throat.   Respiratory:  Positive for cough. Negative for hemoptysis, shortness of breath and wheezing.   Cardiovascular:  Negative for chest pain.  Musculoskeletal:  Positive for myalgias.  Neurological:  Positive for headaches.        Objective:    BP 109/70   Pulse (!) 104   Temp 98.3 F (36.8 C) (Temporal)   Ht 5\' 6"  (1.676 m)   Wt 229 lb 2 oz (103.9 kg)   SpO2 95%   BMI 36.98 kg/m    Physical Exam Vitals and nursing note reviewed.  Constitutional:      General: She is not in acute distress.    Appearance: Normal appearance. She is ill-appearing. She is not toxic-appearing or diaphoretic.  HENT:     Head: Normocephalic and atraumatic.     Right Ear: Tympanic membrane, ear canal and external ear normal.     Left Ear: Tympanic membrane, ear canal and external ear normal.     Nose: Congestion present.     Mouth/Throat:     Mouth: Mucous membranes are moist.     Pharynx: Oropharynx is clear. No oropharyngeal exudate or posterior oropharyngeal erythema.  Eyes:     General:        Right eye: No discharge.        Left eye: No discharge.     Conjunctiva/sclera:  Conjunctivae normal.  Cardiovascular:     Rate and Rhythm: Normal rate and regular rhythm.     Heart sounds: Normal heart sounds. No murmur heard. Pulmonary:     Effort: Pulmonary effort is normal. No respiratory distress.     Breath sounds: Normal breath sounds. No wheezing or rhonchi.  Musculoskeletal:        General: Normal range of motion.     Cervical back: Neck supple.  Lymphadenopathy:     Cervical: No cervical adenopathy.  Skin:    General: Skin is warm and dry.  Neurological:     Mental Status: She is alert and oriented to person, place, and time. Mental status is at baseline.  Psychiatric:        Mood and Affect: Mood normal.        Behavior: Behavior normal.     No results found for any visits on 07/23/23.      Assessment & Plan:   Claudio was seen today for cough.  Diagnoses and all orders for this visit:  Viral URI Negative flu today. Declined Covid test, will take home Covid test. Discussed symptomatic care and return precautions.  -     Veritor Flu A/B Waived  The patient indicates understanding of these issues and agrees with the plan.  Gabriel Earing, FNP

## 2023-07-23 NOTE — Patient Instructions (Signed)
Viral Respiratory Infection A respiratory infection is an illness that affects part of the respiratory system, such as the lungs, nose, or throat. A respiratory infection that is caused by a virus is called a viral respiratory infection. Common types of viral respiratory infections include: A cold. The flu (influenza). A respiratory syncytial virus (RSV) infection. What are the causes? This condition is caused by a virus. The virus may spread through contact with droplets or direct contact with infected people or their mucus or secretions. The virus may spread from person to person (is contagious). What are the signs or symptoms? Symptoms of this condition include: A stuffy or runny nose. A sore throat or cough. Shortness of breath or difficulty breathing. Yellow or green mucus (sputum). Other symptoms may include: A fever. Sweating or chills. Fatigue. Achy muscles. A headache. How is this diagnosed? This condition may be diagnosed based on: Your symptoms. A physical exam. Testing of secretions from the nose or throat. Chest X-ray. How is this treated? This condition may be treated with medicines, such as: Antiviral medicine. This may shorten the length of time a person has symptoms. Expectorants. These make it easier to cough up mucus. Decongestant nasal sprays. Acetaminophen or NSAIDs, such as ibuprofen, to relieve fever and pain. Antibiotic medicines are not prescribed for viral infections.This is because antibiotics are designed to kill bacteria. They do not kill viruses. Follow these instructions at home: Managing pain and congestion Take over-the-counter and prescription medicines only as told by your health care provider. If you have a sore throat, gargle with a mixture of salt and water 3-4 times a day or as needed. To make salt water, completely dissolve -1 tsp (3-6 g) of salt in 1 cup (237 mL) of warm water. Use nose drops made from salt water to ease congestion and  soften raw skin around your nose. Take 2 tsp (10 mL) of honey at bedtime to lessen coughing at night. Do not give honey to children who are younger than 1 year. Drink enough fluid to keep your urine pale yellow. This helps prevent dehydration and helps loosen up mucus. General instructions  Rest as much as possible. Do not drink alcohol. Do not use any products that contain nicotine or tobacco. These products include cigarettes, chewing tobacco, and vaping devices, such as e-cigarettes. If you need help quitting, ask your health care provider. Keep all follow-up visits. This is important. How is this prevented?     Get an annual flu shot. You may get the flu shot in late summer, fall, or winter. Ask your health care provider when you should get your flu shot. Avoid spreading your infection to other people. If you are sick: Wash your hands with soap and water often, especially after you cough or sneeze. Wash for at least 20 seconds. If soap and water are not available, use alcohol-based hand sanitizer. Cover your mouth when you cough. Cover your nose and mouth when you sneeze. Do not share cups or eating utensils. Clean commonly used objects often. Clean commonly touched surfaces. Stay home from work or school as told by your health care provider. Avoid contact with people who are sick during cold and flu season. This is generally fall and winter. Contact a health care provider if: Your symptoms last for 10 days or longer. Your symptoms get worse over time. You have severe sinus pain in your face or forehead. The glands in your jaw or neck become very swollen. You have shortness of breath. Get   help right away if you: Feel pain or pressure in your chest. Have trouble breathing. Faint or feel like you will faint. Have severe and persistent vomiting. Feel confused or disoriented. These symptoms may represent a serious problem that is an emergency. Do not wait to see if the symptoms will  go away. Get medical help right away. Call your local emergency services (911 in the [place] Do not drive yourself to the hospital. Summary A respiratory infection is an illness that affects part of the respiratory system, such as the lungs, nose, or throat. A respiratory infection that is caused by a virus is called a viral respiratory infection. Common types of viral respiratory infections include a cold, influenza, and respiratory syncytial virus (RSV) infection. Symptoms of this condition include a stuffy or runny nose, cough, fatigue, achy muscles, sore throat, and fevers or chills. Antibiotic medicines are not prescribed for viral infections. This is because antibiotics are designed to kill bacteria. They are not effective against viruses. This information is not intended to replace advice given to you by your health care provider. Make sure you discuss any questions you have with your health care provider. Document Revised: 11/11/2020 Document Reviewed: 11/11/2020 Elsevier Patient Education  2024 [employer] Inc[employer]  

## 2023-08-08 ENCOUNTER — Other Ambulatory Visit: Payer: Self-pay

## 2023-08-08 ENCOUNTER — Emergency Department (HOSPITAL_COMMUNITY)
Admission: EM | Admit: 2023-08-08 | Discharge: 2023-08-08 | Discharge status: Against Medical Advice | Payer: Medicaid Other | Attending: Emergency Medicine | Admitting: Emergency Medicine

## 2023-08-08 ENCOUNTER — Emergency Department (HOSPITAL_COMMUNITY): Payer: Medicaid Other

## 2023-08-08 ENCOUNTER — Encounter (HOSPITAL_COMMUNITY): Payer: Self-pay

## 2023-08-08 DIAGNOSIS — M542 Cervicalgia: Secondary | ICD-10-CM | POA: Diagnosis not present

## 2023-08-08 DIAGNOSIS — R059 Cough, unspecified: Secondary | ICD-10-CM | POA: Insufficient documentation

## 2023-08-08 DIAGNOSIS — R1012 Left upper quadrant pain: Secondary | ICD-10-CM | POA: Insufficient documentation

## 2023-08-08 DIAGNOSIS — M549 Dorsalgia, unspecified: Secondary | ICD-10-CM | POA: Insufficient documentation

## 2023-08-08 DIAGNOSIS — M545 Low back pain, unspecified: Secondary | ICD-10-CM | POA: Diagnosis not present

## 2023-08-08 DIAGNOSIS — Z5321 Procedure and treatment not carried out due to patient leaving prior to being seen by health care provider: Secondary | ICD-10-CM | POA: Insufficient documentation

## 2023-08-08 DIAGNOSIS — R109 Unspecified abdominal pain: Secondary | ICD-10-CM | POA: Diagnosis not present

## 2023-08-08 DIAGNOSIS — N281 Cyst of kidney, acquired: Secondary | ICD-10-CM | POA: Diagnosis not present

## 2023-08-08 LAB — CBC WITH DIFFERENTIAL/PLATELET
Abs Immature Granulocytes: 0.02 10*3/uL (ref 0.00–0.07)
Basophils Absolute: 0 10*3/uL (ref 0.0–0.1)
Basophils Relative: 1 %
Eosinophils Absolute: 0 10*3/uL (ref 0.0–0.5)
Eosinophils Relative: 0 %
HCT: 43.6 % (ref 36.0–46.0)
Hemoglobin: 14.2 g/dL (ref 12.0–15.0)
Immature Granulocytes: 0 %
Lymphocytes Relative: 20 %
Lymphs Abs: 1.4 10*3/uL (ref 0.7–4.0)
MCH: 30.9 pg (ref 26.0–34.0)
MCHC: 32.6 g/dL (ref 30.0–36.0)
MCV: 95 fL (ref 80.0–100.0)
Monocytes Absolute: 0.5 10*3/uL (ref 0.1–1.0)
Monocytes Relative: 6 %
Neutro Abs: 5.2 10*3/uL (ref 1.7–7.7)
Neutrophils Relative %: 73 %
Platelets: 303 10*3/uL (ref 150–400)
RBC: 4.59 MIL/uL (ref 3.87–5.11)
RDW: 13.2 % (ref 11.5–15.5)
WBC: 7.2 10*3/uL (ref 4.0–10.5)
nRBC: 0 % (ref 0.0–0.2)

## 2023-08-08 LAB — URINALYSIS, ROUTINE W REFLEX MICROSCOPIC
Bilirubin Urine: NEGATIVE
Glucose, UA: NEGATIVE mg/dL
Hgb urine dipstick: NEGATIVE
Ketones, ur: NEGATIVE mg/dL
Nitrite: NEGATIVE
Protein, ur: NEGATIVE mg/dL
Specific Gravity, Urine: 1.019 (ref 1.005–1.030)
pH: 6 (ref 5.0–8.0)

## 2023-08-08 LAB — BASIC METABOLIC PANEL
Anion gap: 11 (ref 5–15)
BUN: 11 mg/dL (ref 6–20)
CO2: 24 mmol/L (ref 22–32)
Calcium: 9.1 mg/dL (ref 8.9–10.3)
Chloride: 104 mmol/L (ref 98–111)
Creatinine, Ser: 0.76 mg/dL (ref 0.44–1.00)
GFR, Estimated: >60 mL/min (ref >60)
Glucose, Bld: 90 mg/dL (ref 70–99)
Potassium: 3.8 mmol/L (ref 3.5–5.1)
Sodium: 139 mmol/L (ref 135–145)

## 2023-08-08 LAB — POC URINE PREG, ED: Preg Test, Ur: NEGATIVE

## 2023-08-08 NOTE — ED Provider Triage Note (Signed)
Emergency Medicine Provider Triage Evaluation Note  Kristin Saunders , a 32 y.o. female  was evaluated in triage.  Pt complains of left-sided flank pain has been present since Sunday.  Patient has a history of kidney stones and states this feels similar.  Pain does wrap around to the left upper quadrant abdomen.  She denies any dysuria, urinary frequency or urgency. No fever or chills.   Review of Systems  Positive:  Negative: See above  Physical Exam  BP 116/82 (BP Location: Right Arm)   Pulse 68   Temp 98.5 F (36.9 C)   Resp 18   Ht 5\' 6"  (1.676 m)   Wt 99.8 kg   SpO2 100%   BMI 35.51 kg/m  Gen:   Awake, no distress   Resp:  Normal effort  MSK:   Moves extremities without difficulty  Other:  Left CVAT  Medical Decision Making  Medically screening exam initiated at 2:40 PM.  Appropriate orders placed.  Kristin Saunders was informed that the remainder of the evaluation will be completed by another provider, this initial triage assessment does not replace that evaluation, and the importance of remaining in the ED until their evaluation is complete.     Kristin Saunders, [place] 08/08/23 1441

## 2023-08-08 NOTE — ED Notes (Signed)
Pt decided to leave 

## 2023-08-08 NOTE — ED Triage Notes (Addendum)
Pt here for middle back pain and pain under breast/ neck pain for a week. C/O cough last night. Denies fevers. Denies n/v/d. Dark yellow urine and hx of kidney stone in November. Pt states it hurts to urinate.

## 2023-08-09 DIAGNOSIS — R300 Dysuria: Secondary | ICD-10-CM | POA: Diagnosis not present

## 2023-08-09 DIAGNOSIS — N390 Urinary tract infection, site not specified: Secondary | ICD-10-CM | POA: Diagnosis not present

## 2023-08-09 DIAGNOSIS — R109 Unspecified abdominal pain: Secondary | ICD-10-CM | POA: Diagnosis not present

## 2023-08-28 ENCOUNTER — Encounter: Payer: Self-pay | Admitting: Family

## 2023-08-28 ENCOUNTER — Telehealth: Payer: Medicaid Other | Admitting: Family

## 2023-08-28 DIAGNOSIS — M545 Low back pain, unspecified: Secondary | ICD-10-CM | POA: Diagnosis not present

## 2023-08-28 DIAGNOSIS — G8929 Other chronic pain: Secondary | ICD-10-CM | POA: Diagnosis not present

## 2023-08-28 DIAGNOSIS — N644 Mastodynia: Secondary | ICD-10-CM | POA: Diagnosis not present

## 2023-08-28 DIAGNOSIS — E669 Obesity, unspecified: Secondary | ICD-10-CM | POA: Diagnosis not present

## 2023-08-28 DIAGNOSIS — N62 Hypertrophy of breast: Secondary | ICD-10-CM

## 2023-08-28 DIAGNOSIS — E66811 Obesity, class 1: Secondary | ICD-10-CM

## 2023-08-28 MED ORDER — NAPROXEN 500 MG PO TABS: 500.0000 mg | ORAL_TABLET | Freq: Two times a day (BID) | ORAL | 1 refills | Status: DC

## 2023-08-28 NOTE — Addendum Note (Signed)
 Addended by: [name] A on: 08/28/2023 12:44 PM   Modules accepted: Orders, Level of Service

## 2023-08-28 NOTE — Patient Instructions (Signed)

## 2023-08-28 NOTE — Progress Notes (Addendum)
 Virtual Visit Consent   Kristin Saunders, you are scheduled for a virtual visit with a [facility] provider today. Just as with appointments in the office, your consent must be obtained to participate. Your consent will be active for this visit and any virtual visit you may have with one of our providers in the next 365 days. If you have a MyChart account, a copy of this consent can be sent to you electronically.  As this is a virtual visit, video technology does not allow for your provider to perform a traditional examination. This may limit your provider's ability to fully assess your condition. If your provider identifies any concerns that need to be evaluated in person or the need to arrange testing (such as labs, EKG, etc.), we will make arrangements to do so. Although advances in technology are sophisticated, we cannot ensure that it will always work on either your end or our end. If the connection with a video visit is poor, the visit may have to be switched to a telephone visit. With either a video or telephone visit, we are not always able to ensure that we have a secure connection.  By engaging in this virtual visit, you consent to the provision of healthcare and authorize for your insurance to be billed (if applicable) for the services provided during this visit. Depending on your insurance coverage, you may receive a charge related to this service.  I need to obtain your verbal consent now. Are you willing to proceed with your visit today? Kristin Saunders has provided verbal consent on 08/28/2023 for a virtual visit (video or telephone). Bari Learn, FNP  Date: 08/28/2023 12:44 PM  Virtual Visit via Video Note   I, Bari Learn, connected with  Kristin Saunders  ([id number], [birth year]) on 08/28/23 at  8:25 AM EST by a video-enabled telemedicine application and verified that I am speaking with the correct person using two identifiers.  Location: Patient: Virtual Visit Location Patient:  Home Provider: Virtual Visit Location Provider: Home Office   I discussed the limitations of evaluation and management by telemedicine and the availability of in person appointments. The patient expressed understanding and agreed to proceed.    History of Present Illness: Kristin Saunders is a 33 y.o. who identifies as a female who was assigned female at birth, and is being seen today for bilateral breast pain that comes and goes. Reports she has lower back pain that is a 10 out 10. She is currently wearing 43 DDD and wants to discuss breast reduction.   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   Obstructive sleep apnea of adult 05/13/2020   Spondylolisthesis at L5-S1 level 03/11/2019   Spondylolysis 03/11/2019   Chronic bilateral low back pain with bilateral sciatica 03/11/2019   Obesity (BMI 30.0-34.9) 03/19/2018    Allergies: No Known Allergies Medications:  Current Outpatient Medications:    naproxen  (NAPROSYN ) 500 MG tablet, Take 1 tablet (500 mg total) by mouth 2 (two) times daily with a meal., Disp: 60 tablet, Rfl: 1   fluticasone  (FLONASE ) 50 MCG/ACT nasal spray, Place 2 sprays into both nostrils daily., Disp: 16 g, Rfl: 6  Observations/Objective: Patient is well-developed, well-nourished in  no acute distress.  Resting comfortably  at home.  Head is normocephalic, atraumatic.  No labored breathing.  Speech is clear and coherent with logical content.  Patient is alert and oriented at baseline.  Pain in lumbar with flexion and extension   Assessment and Plan: 1. Breast pain (Primary) - Ambulatory referral to Plastic Surgery  2. Chronic bilateral low back pain without  sciatica - Ambulatory referral to Plastic Surgery - naproxen  (NAPROSYN ) 500 MG tablet; Take 1 tablet (500 mg total) by mouth 2 (two) times daily with a meal.  Dispense: 60 tablet; Refill: 1  3. Large breasts - Ambulatory referral to Plastic Surgery  4. Obesity (BMI 30.0-34.9)  Will place referral to plastic surgery. Discussed she will need to complete PT. ROM exercises encouraged. NSAID as needed. Encouraged weight loss.  Keep chronic follow up  Follow Up Instructions: I discussed the assessment and treatment plan with the patient. The patient was provided an opportunity to ask questions and all were answered. The patient agreed with the plan and demonstrated an understanding of the instructions.  A copy of instructions were sent to the patient via MyChart unless otherwise noted below.     The patient was advised to call back or seek an in-person evaluation if the symptoms worsen or if the condition fails to improve as anticipated.    Bari Learn, FNP

## 2023-09-11 ENCOUNTER — Telehealth (INDEPENDENT_AMBULATORY_CARE_PROVIDER_SITE_OTHER): Payer: Medicaid Other | Admitting: Nurse Practitioner

## 2023-09-11 ENCOUNTER — Encounter: Payer: Self-pay | Admitting: Nurse Practitioner

## 2023-09-11 DIAGNOSIS — J020 Streptococcal pharyngitis: Secondary | ICD-10-CM | POA: Diagnosis not present

## 2023-09-11 DIAGNOSIS — J029 Acute pharyngitis, unspecified: Secondary | ICD-10-CM | POA: Diagnosis not present

## 2023-09-11 LAB — RAPID STREP SCREEN (MED CTR MEBANE ONLY): Strep Gp A Ag, IA W/Reflex: POSITIVE — AB

## 2023-09-11 MED ORDER — AMOXICILLIN 500 MG PO CAPS: 500.0000 mg | ORAL_CAPSULE | Freq: Two times a day (BID) | ORAL | 0 refills | Status: AC

## 2023-09-11 NOTE — Progress Notes (Signed)
Virtual Visit via video Note Due to COVID-19 pandemic this visit was conducted virtually. This visit type was conducted due to national recommendations for restrictions regarding the COVID-19 Pandemic (e.g. social distancing, sheltering in place) in an effort to limit this patient's exposure and mitigate transmission in our community. All issues noted in this document were discussed and addressed.  A physical exam was not performed with this format.   I connected with Kristin Saunders on 09/11/2023 at 1212 by name and dOB and verified that I am speaking with the correct person using two identifiers. Kristin Saunders is currently located at home and  during visit. The provider, Martina Sinner, DNP is located in their office at time of visit.  I discussed the limitations, risks, security and privacy concerns of performing an evaluation and management service by virtual visit and the availability of in person appointments. I also discussed with the patient that there may be a patient responsible charge related to this service. The patient expressed understanding and agreed to proceed.  Subjective:  Patient ID: Kristin Saunders, female    DOB: [birth year], 33 y.o.   MRN: [id number]  Chief Complaint:  Sore Throat ("For 2 days, trouble swallowing and cough yellow sputum for 1-day")   HPI: Kristin Saunders is a 33 y.o. female presenting on 09/11/2023 for Sore Throat ("For 2 days, trouble swallowing and cough yellow sputum for 1-day")  Sore throat new issues 33 year old female presents with a 2-day history of sore throat. She reports difficulty swallowing but denies fever, cough, or yellow sputum. The patient does not mention any other significant symptoms such as nasal congestion, headache, or fatigue. She has decided to come to the clinic for a throat swab to test for streptococcal infection.  No significant past medical history reported, and no known allergies mentioned. The patient is not on any  current medications and denies recent illness or exposure to anyone with similar symptoms. Relevant past medical, surgical, family, and social history reviewed and updated as indicated.  Allergies and medications reviewed and updated.   Past Medical History:  Diagnosis Date   Anxiety    Arthritis    Depression    GERD (gastroesophageal reflux disease)    Headache    Medical history non-contributory     Past Surgical History:  Procedure Laterality Date   ANTERIOR CERVICAL DECOMP/DISCECTOMY FUSION N/A 02/07/2022   Procedure: ANTERIOR CERVICAL DISCECTOMY AND  FUSION C3-4,  C4-5 WITH PLATE, SCREWS, LOCAL BONE GRAFT AND VIVIGEN;  Surgeon: Kerrin Champagne, MD;  Location: [facility] OR;  Service: Orthopedics;  Laterality: N/A;   CERVICAL DISC ARTHROPLASTY N/A 02/07/2022   Procedure: TOTAL CERVICAL DISC ARTHROPLASTY C5-6;  Surgeon: Kerrin Champagne, MD;  Location: [facility] OR;  Service: Orthopedics;  Laterality: N/A;   NO PAST SURGERIES     TUBAL LIGATION      Social History   Socioeconomic History   Marital status: Single    Spouse name: Not on file   Number of children: 2   Years of education: Not on file   Highest education level: Not on file  Occupational History   Not on file  Tobacco Use   Smoking status: Never   Smokeless tobacco: Never  Vaping Use   Vaping status: Never Used  Substance and Sexual Activity   Alcohol use: Not Currently   Drug use: No   Sexual activity: Yes    Birth control/protection: None, Surgical  Other Topics Concern   Not on file  Social  History Narrative   Not on file   Social Drivers of Health   Financial Resource Strain: Not on file  Food Insecurity: No Food Insecurity (06/13/2023)   Hunger Vital Sign    Worried About Running Out of Food in the Last Year: Never true    Ran Out of Food in the Last Year: Never true  Transportation Needs: No Transportation Needs (06/13/2023)   PRAPARE - Administrator, Civil Service (Medical): No    Lack of  Transportation (Non-Medical): No  Physical Activity: Not on file  Stress: Not on file  Social Connections: Unknown (06/19/2023)   Received from [facility]   Social Network    Social Network: Not on file  Intimate Partner Violence: Unknown (06/19/2023)   Received from [facility]   HITS    Physically Hurt: Not on file    Insult or Talk Down To: Not on file    Threaten Physical Harm: Not on file    Scream or Curse: Not on file    Outpatient Encounter Medications as of 09/11/2023  Medication Sig   amoxicillin (AMOXIL) 500 MG capsule Take 1 capsule (500 mg total) by mouth 2 (two) times daily for 10 days.   fluticasone (FLONASE) 50 MCG/ACT nasal spray Place 2 sprays into both nostrils daily.   naproxen (NAPROSYN) 500 MG tablet Take 1 tablet (500 mg total) by mouth 2 (two) times daily with a meal.   No facility-administered encounter medications on file as of 09/11/2023.    No Known Allergies  Review of Systems  Constitutional:  Positive for fever. Negative for chills and fatigue.  HENT:  Positive for sore throat. Negative for congestion, drooling, ear pain, postnasal drip, rhinorrhea, sinus pressure, sinus pain and sneezing.        Normal swallowing  Eyes:  Negative for pain.  Respiratory:  Positive for cough.        Yellow sputum for 1 day  Cardiovascular:  Negative for chest pain and palpitations.  Gastrointestinal:  Negative for diarrhea and nausea.  Skin:  Negative for color change and rash.  Neurological:  Negative for dizziness and headaches.     Observations/Objective: No vital signs or physical exam, this was a virtual health encounter.  Pt alert and oriented, answers all questions appropriately, and able to speak in full sentences.    Assessment and Plan: Kristin Saunders was seen today for sore throat.  Diagnoses and all orders for this visit:  Sore throat -     Cancel: POCT Rapid Strep A Dipstick Test -     Rapid Strep Screen ([facility] ONLY) -      amoxicillin (AMOXIL) 500 MG capsule; Take 1 capsule (500 mg total) by mouth 2 (two) times daily for 10 days.  Acute streptococcal pharyngitis -     amoxicillin (AMOXIL) 500 MG capsule; Take 1 capsule (500 mg total) by mouth 2 (two) times daily for 10 days.   Kristin Saunders is 65 yrs caucasian female seen today via telehealth for pharyngitis POCT strep positive will treat with Amoxicillin 500 mg BID for 10 days Increase hydration, tylenol/ibuprofen for fever Note for work provided Follow Up Instructions: Return if symptoms worsen or fail to improve.    I discussed the assessment and treatment plan with the patient. The patient was provided an opportunity to ask questions and all were answered. The patient agreed with the plan and demonstrated an understanding of the instructions.   The patient was advised to call back or  seek an in-person evaluation if the symptoms worsen or if the condition fails to improve as anticipated.  The above assessment and management plan was discussed with the patient. The patient verbalized understanding of and has agreed to the management plan. Patient is aware to call the clinic if they develop any new symptoms or if symptoms persist or worsen. Patient is aware when to return to the clinic for a follow-up visit. Patient educated on when it is appropriate to go to the emergency department.    I provided 12 minutes of time during this video encounter.   Arrie Aran Santa Lighter, DNP [facility] [address] [place], [place] [address] [phone] 09/11/2023

## 2023-09-20 ENCOUNTER — Institutional Professional Consult (permissible substitution): Payer: Medicaid Other | Admitting: Plastic Surgery

## 2023-09-20 DIAGNOSIS — R351 Nocturia: Secondary | ICD-10-CM | POA: Diagnosis not present

## 2023-09-20 DIAGNOSIS — N201 Calculus of ureter: Secondary | ICD-10-CM | POA: Diagnosis not present

## 2023-09-20 DIAGNOSIS — R300 Dysuria: Secondary | ICD-10-CM | POA: Diagnosis not present

## 2023-09-20 DIAGNOSIS — R3982 Chronic bladder pain: Secondary | ICD-10-CM | POA: Diagnosis not present

## 2023-10-03 ENCOUNTER — Ambulatory Visit: Payer: Medicaid Other | Admitting: Nurse Practitioner

## 2023-10-08 ENCOUNTER — Ambulatory Visit: Payer: Medicaid Other | Admitting: Family

## 2023-10-11 ENCOUNTER — Encounter: Payer: Self-pay | Admitting: Family

## 2023-10-15 ENCOUNTER — Ambulatory Visit: Payer: Self-pay | Admitting: Family

## 2023-10-15 ENCOUNTER — Encounter: Payer: Self-pay | Admitting: Nurse Practitioner

## 2023-10-15 ENCOUNTER — Telehealth: Payer: Self-pay | Admitting: Family

## 2023-10-15 ENCOUNTER — Telehealth (INDEPENDENT_AMBULATORY_CARE_PROVIDER_SITE_OTHER): Payer: Medicaid Other | Admitting: Nurse Practitioner

## 2023-10-15 DIAGNOSIS — L500 Allergic urticaria: Secondary | ICD-10-CM

## 2023-10-15 DIAGNOSIS — R220 Localized swelling, mass and lump, head: Secondary | ICD-10-CM

## 2023-10-15 MED ORDER — PREDNISONE 20 MG PO TABS: 40.0000 mg | ORAL_TABLET | Freq: Every day | ORAL | 0 refills | Status: AC

## 2023-10-15 NOTE — Patient Instructions (Signed)
 Angioedema Angioedema is swelling in the body. The swelling can occur in any part of the body. It can affect any part of the body, including the legs, hands, genitals, face, mouth, lips, and organs. It may cause itchy, red, swollen areas of skin (hives) to form. This condition may: Happen only one time. Happen more than one time. It can also stop at any time. Keep coming back for a number of years. Someday it may stop. What are the causes? This condition may be caused by: Foods, such as milk, eggs, shellfish, wheat, or nuts. Medicines, such as ACE inhibitors, antibiotics, birth control pills, dyes used in X-rays or NSAIDs such as ibuprofen. Hereditary angioedema (HAE) is passed from parent to child. Symptoms can occur because of: Illness. Infection. Stress. Changes in hormones. Exercise. Minor surgery. Dental work. In some cases, the cause of this condition may not be known. What increases the risk? You are more likely to have HAE if you have family members with this condition. What are the signs or symptoms? Symptoms of this condition include: Swollen skin. Itchy, red, swollen areas of skin. Pain, pressure, or tenderness in the affected area. Swollen eyelids, face, lips, or tongue. Trouble drinking, swallowing, or fully closing the mouth. Being hoarse or having a sore throat. Wheezing. Trouble breathing. If your organs are affected, you may: Feel like vomiting. Have pain in your belly (abdomen). Vomit or have watery poop (diarrhea). Have trouble swallowing. Have trouble peeing. How is this treated? To treat this condition, you may be told: To avoid things that cause attacks (triggers). These include foods or things that cause allergies. To stop medicines that cause the condition. To take medicines to treat the condition. In very bad cases, a breathing tube or a machine that helps with breathing (ventilator) may be used. Follow these instructions at home:  Take all  medicines only as told by your doctor. If you were given medicines to treat allergies, always carry them with you. Wear a medical bracelet as told by your doctor. Avoid the things that cause attacks. These may include: Foods. Things in your environment (such as pollen). Stress. Exercise. Avoid all medicines that caused the attacks. Talk to your doctor before you have kids. Some types of this condition may be passed from parent to child. Where to find more information American Academy of Allergy Asthma & Immunology: www.aaaai.org Contact a doctor if: You have another attack. Your attacks happen more often, even after you take steps to prevent them. Your attacks are worse every time they occur. You are thinking about having kids. Get help right away if: Your mouth, tongue, or lips get very swollen. Your swelling gets worse. You have trouble breathing or swallowing. You have trouble talking. You have chest pain. You feel dizzy. You feel light-headed. You faint. These symptoms may be an emergency. Get help right away. Call your local emergency services (911 in the [place] Do not wait to see if the symptoms will go away. Do not drive yourself to the hospital. Summary Angioedema is swelling in the body. Angioedema can be caused by the food you eat or the medicines you take. Avoid the things that cause your attacks. These can be food, medicines, or things in your environment. If you were given medicines for allergies, always carry them with you. Get help right away if your mouth, tongue, or lips get swollen. Also, get help right away if you have trouble breathing or swallowing. This information is not intended to replace advice given  to you by your health care provider. Make sure you discuss any questions you have with your health care provider. Document Revised: 12/08/2020 Document Reviewed: 12/08/2020 Elsevier Patient Education  2024 [employer].

## 2023-10-15 NOTE — Telephone Encounter (Signed)
  Chief Complaint: lower lip mild swelling, upper lip sores Symptoms: lower lip mild swelling/tingling/redness, upper lip sores, sore throat, bumps on tongue Frequency: sore throat and bumps on tongue intermittent x couple of weeks, lip symptoms x today Pertinent Negatives: Patient denies difficulty breathing, tongue swelling, mouth itching, fever, toothaches. Disposition: [] ED /[] Urgent Care (no appt availability in office) / [x] Appointment(In office/virtual)/ []  [facility]/ [] Home Care/ [] Refused Recommended Disposition /[] [facility]/ []  Follow-up with PCP Additional Notes: Patient states she has felt feverish but when she checks she does not have a fever. Patient refused in person visit due to she states she does not have any transportation. Patient requesting virtual visit.   Copied from CRM [id number]. Topic: Clinical - Red Word Triage >> Oct 15, 2023 11:11 AM Geroge Baseman wrote: Red Word that prompted transfer to Nurse Triage: Sore throat, bumps on tongue, been going on a couple weeks off and on. Lips are swollen and tingling. Reason for Disposition  Swelling is painful to touch  Answer Assessment - Initial Assessment Questions 1. ONSET: "When did the swelling start?" (e.g., minutes, hours, days)     Today.  2. SEVERITY: "How swollen is it?"     "They ain't bad, it's just freaking me out".  The lower lip is swollen and upper lip has sores on it.  3. ITCHING: "Is there any itching?" If Yes, ask: "How much?"   (Scale 1-10; mild, moderate or severe)     Denies.  4. PAIN: "Is the swelling painful to touch?" If Yes, ask: "How painful is it?"   (Scale 1-10; mild, moderate or severe)     Burning, 5/10.  5. CAUSE: "What do you think is causing the lip swelling?"     Unsure of cause, patient states she has a new boyfriend. She isn't sure if that is related.  6. RECURRENT SYMPTOM: "Have you had lip swelling before?" If Yes, ask: "When was the last time?" "What happened  that time?"     Denies.  7. OTHER SYMPTOMS: "Do you have any other symptoms?" (e.g., toothache)     Sore throat and bumps on tongue intermittent x couple of weeks (she states she recently was diagnosed with strep throat and completed the antibiotic).  8. PREGNANCY: "Is there any chance you are pregnant?" "When was your last menstrual period?"     LMP: currently on it.  Protocols used: Lip Swelling-A-AH

## 2023-10-15 NOTE — Progress Notes (Signed)
 Virtual Visit Consent   Kristin Saunders, you are scheduled for a virtual visit with Mary-Margaret Daphine Deutscher, FNP, a [facility] provider, today.     Just as with appointments in the office, your consent must be obtained to participate.  Your consent will be active for this visit and any virtual visit you may have with one of our providers in the next 365 days.     If you have a MyChart account, a copy of this consent can be sent to you electronically.  All virtual visits are billed to your insurance company just like a traditional visit in the office.    As this is a virtual visit, video technology does not allow for your provider to perform a traditional examination.  This may limit your provider's ability to fully assess your condition.  If your provider identifies any concerns that need to be evaluated in person or the need to arrange testing (such as labs, EKG, etc.), we will make arrangements to do so.     Although advances in technology are sophisticated, we cannot ensure that it will always work on either your end or our end.  If the connection with a video visit is poor, the visit may have to be switched to a telephone visit.  With either a video or telephone visit, we are not always able to ensure that we have a secure connection.     I need to obtain your verbal consent now.   Are you willing to proceed with your visit today? YES   Delonna Ney has provided verbal consent on 10/15/2023 for a virtual visit (video or telephone).   Mary-Margaret Daphine Deutscher, FNP   Date: 10/15/2023 12:37 PM   Virtual Visit via Video Note   I, Mary-Margaret Daphine Deutscher, connected with Judithann Sheen ([id number], 02-05-1991) on 10/15/23 at 12:30 PM EST by a video-enabled telemedicine application and verified that I am speaking with the correct person using two identifiers.  Location: Patient: Virtual Visit Location Patient: Home Provider: Virtual Visit Location Provider: Mobile   I discussed the limitations of  evaluation and management by telemedicine and the availability of in person appointments. The patient expressed understanding and agreed to proceed.    History of Present Illness: Kristin Saunders is a 33 y.o. who identifies as a female who was assigned female at birth, and is being seen today for lip swelling.  HPI: Upper lip swelling and tingling. Has itchy ras that is coming and going for several days.     Review of Systems  Constitutional:  Negative for chills and fever.  HENT:  Negative for congestion and sore throat.   Cardiovascular:  Negative for leg swelling.    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   Obstructive sleep apnea of adult 05/13/2020   Spondylolisthesis at L5-S1 level 03/11/2019   Spondylolysis 03/11/2019   Chronic bilateral low back pain with bilateral sciatica 03/11/2019   Obesity (BMI 30.0-34.9) 03/19/2018    Allergies: No Known Allergies Medications:  Current Outpatient Medications:    fluticasone (FLONASE) 50 MCG/ACT nasal spray, Place 2 sprays into both nostrils daily., Disp: 16 g, Rfl: 6   naproxen (NAPROSYN) 500  MG tablet, Take 1 tablet (500 mg total) by mouth 2 (two) times daily with a meal., Disp: 60 tablet, Rfl: 1  Observations/Objective: Patient is well-developed, well-nourished in no acute distress.  Resting comfortably  at home.  Head is normocephalic, atraumatic.  No labored breathing.  Speech is clear and coherent with logical content.  Patient is alert and oriented at baseline.  Mid upper lip swelling Has red raised lesions on chest and legs.  Assessment and Plan:  Judithann Sheen in today with chief  complaint of No chief complaint on file.   1. Allergic urticaria (Primary) Claritin daily Force fluids To ED if develops SOB - predniSONE (DELTASONE) 20 MG tablet; Take 2 tablets (40 mg total) by mouth daily with breakfast for 5 days. 2 po daily for 5 days  Dispense: 10 tablet; Refill: 0  2. Lip swelling - predniSONE (DELTASONE) 20 MG tablet; Take 2 tablets (40 mg total) by mouth daily with breakfast for 5 days. 2 po daily for 5 days  Dispense: 10 tablet; Refill: 0     Follow Up Instructions: I discussed the assessment and treatment plan with the patient. The patient was provided an opportunity to ask questions and all were answered. The patient agreed with the plan and demonstrated an understanding of the instructions.  A copy of instructions were sent to the patient via MyChart.  The patient was advised to call back or seek an in-person evaluation if the symptoms worsen or if the condition fails to improve as anticipated.  Time:  I spent 5 minutes with the patient via telehealth technology discussing the above problems/concerns.    Mary-Margaret Daphine Deutscher, FNP

## 2023-10-18 ENCOUNTER — Ambulatory Visit: Payer: Medicaid Other | Admitting: Plastic Surgery

## 2023-10-18 ENCOUNTER — Encounter: Payer: Self-pay | Admitting: Plastic Surgery

## 2023-10-18 VITALS — BP 125/84 | HR 87 | Ht 66.0 in | Wt 225.6 lb

## 2023-10-18 DIAGNOSIS — M4004 Postural kyphosis, thoracic region: Secondary | ICD-10-CM

## 2023-10-18 DIAGNOSIS — N6489 Other specified disorders of breast: Secondary | ICD-10-CM | POA: Diagnosis not present

## 2023-10-18 DIAGNOSIS — B3731 Acute candidiasis of vulva and vagina: Secondary | ICD-10-CM | POA: Diagnosis not present

## 2023-10-18 DIAGNOSIS — M793 Panniculitis, unspecified: Secondary | ICD-10-CM | POA: Diagnosis not present

## 2023-10-18 DIAGNOSIS — Z20822 Contact with and (suspected) exposure to covid-19: Secondary | ICD-10-CM | POA: Diagnosis not present

## 2023-10-18 DIAGNOSIS — N62 Hypertrophy of breast: Secondary | ICD-10-CM | POA: Diagnosis not present

## 2023-10-18 DIAGNOSIS — Z202 Contact with and (suspected) exposure to infections with a predominantly sexual mode of transmission: Secondary | ICD-10-CM | POA: Diagnosis not present

## 2023-10-18 DIAGNOSIS — M546 Pain in thoracic spine: Secondary | ICD-10-CM | POA: Diagnosis not present

## 2023-10-18 DIAGNOSIS — R21 Rash and other nonspecific skin eruption: Secondary | ICD-10-CM | POA: Diagnosis not present

## 2023-10-18 DIAGNOSIS — R070 Pain in throat: Secondary | ICD-10-CM | POA: Diagnosis not present

## 2023-10-18 DIAGNOSIS — R350 Frequency of micturition: Secondary | ICD-10-CM | POA: Diagnosis not present

## 2023-10-18 NOTE — Progress Notes (Signed)
 Referring Provider Junie Spencer, FNP [address] [place],  [place] [address]   CC:  Chief Complaint  Patient presents with   Advice Only      Kristin Saunders is an 33 y.o. female.  HPI: Kristin Saunders is a 33 year old female who presents today with complaints of upper back and neck pain of at least 2 years duration.  She feels that this upper back and neck pain is secondary to large breast size.  She states that she has difficulty with daily activities due to the large size of her breast and her breast getting in the way when she is trying to lift boxes at work.  She also finds it difficult to find bras that fit appropriately.  She would like to have a bilateral breast reduction.  No Known Allergies  Outpatient Encounter Medications as of 10/18/2023  Medication Sig   fluticasone (FLONASE) 50 MCG/ACT nasal spray Place 2 sprays into both nostrils daily.   naproxen (NAPROSYN) 500 MG tablet Take 1 tablet (500 mg total) by mouth 2 (two) times daily with a meal.   predniSONE (DELTASONE) 20 MG tablet Take 2 tablets (40 mg total) by mouth daily with breakfast for 5 days. 2 po daily for 5 days   No facility-administered encounter medications on file as of 10/18/2023.     Past Medical History:  Diagnosis Date   Anxiety    Arthritis    Depression    GERD (gastroesophageal reflux disease)    Headache    Medical history non-contributory     Past Surgical History:  Procedure Laterality Date   ANTERIOR CERVICAL DECOMP/DISCECTOMY FUSION N/A 02/07/2022   Procedure: ANTERIOR CERVICAL DISCECTOMY AND  FUSION C3-4,  C4-5 WITH PLATE, SCREWS, LOCAL BONE GRAFT AND VIVIGEN;  Surgeon: Kerrin Champagne, MD;  Location: [facility] OR;  Service: Orthopedics;  Laterality: N/A;   CERVICAL DISC ARTHROPLASTY N/A 02/07/2022   Procedure: TOTAL CERVICAL DISC ARTHROPLASTY C5-6;  Surgeon: Kerrin Champagne, MD;  Location: [facility] OR;  Service: Orthopedics;  Laterality: N/A;   NO PAST SURGERIES     TUBAL LIGATION      Family  History  Problem Relation Age of Onset   Cancer Father        COLON    Social History   Social History Narrative   Not on file     Review of Systems General: Denies fevers, chills, weight loss CV: Denies chest pain, shortness of breath, palpitations Breast: Patient feels large size of her breasts are contributing to her upper back and neck pain.  She denies other breast issues and denies having had any breast biopsies or breast surgery.  Physical Exam    10/18/2023   12:54 PM 08/08/2023    2:33 PM 08/08/2023    2:00 PM  Vitals with BMI  Height 5\' 6"  5\' 6"    Weight 225 lbs 10 oz 220 lbs   BMI 36.43 35.53   Systolic 125  116  Diastolic 84  82  Pulse 87  68    General:  No acute distress,  Alert and oriented, Non-Toxic, Normal speech and affect Breast: Patient has bilateral pendulous breast with grade 3 ptosis.  She has significant asymmetry of the breast.  There are no dominant masses on physical exam.  The nipples are normal in appearance without evidence of nipple discharge.  Her sternal notch to nipple distance on the right is 34 cm and 35 cm on the left.  Her fold to nipple  distance on the right is 16 cm and 17 cm on the left. Mammogram: Not applicable due to age Assessment/Plan Symptomatic macromastia: Patient has very large breasts and would likely benefit from bilateral breast reduction.  I can remove 700 g on the right and 900 g on the left.  We discussed breast reductions at length.  I showed her the location of the incisions and we discussed the unpredictable nature of scarring and wound healing.  Discussed the risks of bleeding, infection, and seroma formation.  She understands I will use drains postoperatively.  We discussed the risk of nipple loss due to nipple ischemia.  She understands if this happens that she will not have a nipple any longer.  We discussed the importance of early ambulation to prevent DVT.  We discussed the postoperative limitations including no heavy  lifting greater than 20 pounds, no vigorous activity, no submerging the incisions in water for 6 weeks.  She will need to wear supportive compressive garment for 6 weeks.  All questions were answered to her satisfaction.  Photographs were obtained today with her consent.  Will submit her for bilateral breast reduction at her request.  Panniculitis: Before leaving patient asked me about her pannus.  She has ongoing rashes which she treats with over-the-counter medications.  I briefly showed her where the incisions would be for panniculectomy and we discussed drains and compression.  She would like to proceed with the breast surgery first but would like to consider a panniculectomy once she is healed from her breast reduction this is reasonable and I will discuss further with her when she is ready.  Santiago Glad 10/18/2023, 2:03 PM

## 2023-10-20 ENCOUNTER — Encounter (HOSPITAL_COMMUNITY): Payer: Self-pay | Admitting: Specialist

## 2023-10-24 ENCOUNTER — Ambulatory Visit: Payer: Medicaid Other | Admitting: Orthopedic Surgery

## 2023-12-19 DIAGNOSIS — R070 Pain in throat: Secondary | ICD-10-CM | POA: Diagnosis not present

## 2023-12-19 DIAGNOSIS — J209 Acute bronchitis, unspecified: Secondary | ICD-10-CM | POA: Diagnosis not present

## 2024-02-05 DIAGNOSIS — N926 Irregular menstruation, unspecified: Secondary | ICD-10-CM | POA: Diagnosis not present

## 2024-02-05 DIAGNOSIS — R309 Painful micturition, unspecified: Secondary | ICD-10-CM | POA: Diagnosis not present

## 2024-02-05 DIAGNOSIS — B3731 Acute candidiasis of vulva and vagina: Secondary | ICD-10-CM | POA: Diagnosis not present

## 2024-02-07 DIAGNOSIS — R1012 Left upper quadrant pain: Secondary | ICD-10-CM | POA: Diagnosis not present

## 2024-02-07 DIAGNOSIS — M549 Dorsalgia, unspecified: Secondary | ICD-10-CM | POA: Diagnosis not present

## 2024-02-07 DIAGNOSIS — N2889 Other specified disorders of kidney and ureter: Secondary | ICD-10-CM | POA: Diagnosis not present

## 2024-02-07 DIAGNOSIS — Z87442 Personal history of urinary calculi: Secondary | ICD-10-CM | POA: Diagnosis not present

## 2024-02-07 DIAGNOSIS — Z5321 Procedure and treatment not carried out due to patient leaving prior to being seen by health care provider: Secondary | ICD-10-CM | POA: Diagnosis not present

## 2024-03-31 ENCOUNTER — Ambulatory Visit: Payer: Self-pay

## 2024-03-31 NOTE — Telephone Encounter (Signed)
 FYI Only or Action Required?: FYI only for provider.  Patient was last seen in primary care on 10/15/2023 by Kristin Mustard, FNP.  Called Nurse Triage reporting Flank Pain and Abdominal Pain.  Symptoms began a week ago.  Interventions attempted: OTC medications: AZO and Rest, hydration, or home remedies.  Symptoms are: unchanged.  Triage Disposition: Go to ED Now (or PCP Triage)  Patient/caregiver understands and will follow disposition?: Yes  Copied from CRM #[id number]. Topic: Clinical - Red Word Triage >> Mar 31, 2024 11:53 AM Jasmin G wrote: [facility] that prompted transfer to Nurse Triage: Pt is experiencing a very painful UTI, pt states that she feels the pain on stomach and kidneys, she also stated that her urine is really yellow and that she her body feels hot but is not running a fever. Reason for Disposition  Patient sounds very sick or weak to the triager  Answer Assessment - Initial Assessment Questions 1. LOCATION: Where does it hurt?      Lower abdominal area 2. RADIATION: Does the pain shoot anywhere else? (e.g., chest, back)     back 3. ONSET: When did the pain begin? (e.g., minutes, hours or days ago)      Started a week ago 4. SUDDEN: Gradual or sudden onset?     gradual 5. PATTERN Does the pain come and go, or is it constant?     constant 6. SEVERITY: How bad is the pain?  (e.g., Scale 1-10; mild, moderate, or severe)     7 out of 10 7. RECURRENT SYMPTOM: Have you ever had this type of stomach pain before? If Yes, ask: When was the last time? and What happened that time?      Yes-reports having a kidney stone before 8. CAUSE: What do you think is causing the stomach pain? (e.g., gallstones, recent abdominal surgery)     Patient is unsure 9. RELIEVING/AGGRAVATING FACTORS: What makes it better or worse? (e.g., antacids, bending or twisting motion, bowel movement)     AZO made it better 10. OTHER SYMPTOMS: Do you have any other  symptoms? (e.g., back pain, diarrhea, fever, urination pain, vomiting)       Back pain,  11. PREGNANCY: Is there any chance you are pregnant? When was your last menstrual period?       no  Protocols used: Abdominal Pain - [facility]

## 2024-03-31 NOTE — Telephone Encounter (Signed)
 Patient was advised to the urgent care.

## 2024-04-28 DIAGNOSIS — R0981 Nasal congestion: Secondary | ICD-10-CM | POA: Diagnosis not present

## 2024-04-28 DIAGNOSIS — J40 Bronchitis, not specified as acute or chronic: Secondary | ICD-10-CM | POA: Diagnosis not present

## 2024-04-28 DIAGNOSIS — J0100 Acute maxillary sinusitis, unspecified: Secondary | ICD-10-CM | POA: Diagnosis not present

## 2024-05-12 ENCOUNTER — Ambulatory Visit: Admitting: Family

## 2024-05-12 ENCOUNTER — Encounter: Payer: Self-pay | Admitting: Family

## 2024-05-12 VITALS — BP 112/80 | HR 81 | Temp 97.1°F | Ht 66.0 in | Wt 227.2 lb

## 2024-05-12 DIAGNOSIS — Z0001 Encounter for general adult medical examination with abnormal findings: Secondary | ICD-10-CM

## 2024-05-12 DIAGNOSIS — M79671 Pain in right foot: Secondary | ICD-10-CM

## 2024-05-12 DIAGNOSIS — R300 Dysuria: Secondary | ICD-10-CM

## 2024-05-12 DIAGNOSIS — Z0000 Encounter for general adult medical examination without abnormal findings: Secondary | ICD-10-CM | POA: Diagnosis not present

## 2024-05-12 DIAGNOSIS — E669 Obesity, unspecified: Secondary | ICD-10-CM

## 2024-05-12 DIAGNOSIS — N3000 Acute cystitis without hematuria: Secondary | ICD-10-CM

## 2024-05-12 LAB — MICROSCOPIC EXAMINATION

## 2024-05-12 LAB — URINALYSIS, COMPLETE
Bilirubin, UA: NEGATIVE
Glucose, UA: NEGATIVE
Ketones, UA: NEGATIVE
Nitrite, UA: NEGATIVE
Specific Gravity, UA: 1.02 (ref 1.005–1.030)
Urobilinogen, Ur: 0.2 mg/dL (ref 0.2–1.0)
pH, UA: 7 (ref 5.0–7.5)

## 2024-05-12 LAB — LIPID PANEL

## 2024-05-12 MED ORDER — CEPHALEXIN 500 MG PO CAPS: 500.0000 mg | ORAL_CAPSULE | Freq: Two times a day (BID) | ORAL | 0 refills | Status: DC

## 2024-05-12 MED ORDER — DICLOFENAC SODIUM 75 MG PO TBEC: 75.0000 mg | DELAYED_RELEASE_TABLET | Freq: Two times a day (BID) | ORAL | 1 refills | Status: DC

## 2024-05-12 NOTE — Patient Instructions (Signed)
 Foot Pain Many things can cause foot pain. Common causes include injuries to the foot. The injuries include sprains or broken bones, or injuries that affect the nerves in the feet. Other causes of foot pain include arthritis, blisters, and bunions. To know what causes your foot pain, your health care provider will take a detailed history of your symptoms. They will also do a physical exam as well as imaging tests, such as X-ray or MRI. Follow these instructions at home: Managing pain, stiffness, and swelling  If told, put ice on the painful area. Put ice in a plastic bag. Place a towel between your skin and the bag. Leave the ice on for 20 minutes, 2-3 times a day. If your skin turns bright red, remove the ice right away to prevent skin damage. The risk of damage is higher if you cannot feel pain, heat, or cold. Activity Do not stand or walk for long periods. Do stretches to relieve foot pain and stiffness as told by your provider. Do not lift anything that is heavier than 10 lb (4.5 kg), or the limit that you are told, until your provider says that it is safe. Lifting a lot of weight can put added pressure on your feet. Return to your normal activities as told by your provider. Ask your provider what activities are safe for you. Lifestyle Wear comfortable, supportive shoes that fit you well. Do not wear high heels. Keep your feet clean and dry. General instructions Take over-the-counter and prescription medicines only as told by your provider. Rub your foot gently. Pay attention to any changes in your symptoms. Let your provider know if symptoms become worse. Keep all follow-up visits. Your provider will want to monitor your progress. Contact a health care provider if: Your pain does not get better after a few days of treatment at home. Your pain gets worse. You cannot stand on your foot. Your foot or toes are swollen. Your foot is numb or tingling. Get help right away if: Your foot  or toes turn white or blue. You have warmth and redness along your foot. This information is not intended to replace advice given to you by your health care provider. Make sure you discuss any questions you have with your health care provider. Document Revised: 08/31/2022 Document Reviewed: 05/09/2022 Elsevier Patient Education  2024 [employer].

## 2024-05-12 NOTE — Progress Notes (Signed)
 Subjective:    Patient ID: Kristin Saunders, female    DOB: 1991/04/04, 33 y.o.   MRN: [id number]  Chief Complaint  Patient presents with   Foot Pain   Dysuria   PT presents to the office today for CPE without pap.   She is currently not taking any medications.   She is complaining dysuria that started 3 days ago.   Complaining of right heel pain that started several months ago. Denies any injury, but does stand on her feet all day for her job.  Foot Pain This is a new problem. The problem has been unchanged. Pertinent negatives include no nausea or vomiting. The symptoms are aggravated by standing. She has tried ice for the symptoms. The treatment provided mild relief.  Dysuria  This is a new problem. The current episode started in the past 7 days. The problem occurs intermittently. The problem has been gradually worsening. The quality of the pain is described as burning. The pain is at a severity of 8/10. There has been no fever. Associated symptoms include frequency and urgency. Pertinent negatives include no flank pain, hematuria, nausea or vomiting. She has tried increased fluids for the symptoms.      Review of Systems  Gastrointestinal:  Negative for nausea and vomiting.  Genitourinary:  Positive for dysuria, frequency and urgency. Negative for flank pain and hematuria.  All other systems reviewed and are negative.   Social History   Socioeconomic History   Marital status: Single    Spouse name: Not on file   Number of children: 2   Years of education: Not on file   Highest education level: Not on file  Occupational History   Not on file  Tobacco Use   Smoking status: Never   Smokeless tobacco: Never  Vaping Use   Vaping status: Never Used  Substance and Sexual Activity   Alcohol use: Not Currently   Drug use: No   Sexual activity: Yes    Birth control/protection: None, Surgical  Other Topics Concern   Not on file  Social History Narrative   Not on file    Social Drivers of Health   Financial Resource Strain: Not on file  Food Insecurity: No Food Insecurity (06/13/2023)   Hunger Vital Sign    Worried About Running Out of Food in the Last Year: Never true    Ran Out of Food in the Last Year: Never true  Transportation Needs: No Transportation Needs (06/13/2023)   PRAPARE - Administrator, Civil Service (Medical): No    Lack of Transportation (Non-Medical): No  Physical Activity: Not on file  Stress: Not on file  Social Connections: Unknown (06/19/2023)   [facility]   Social Network    Social Network: Not on file   Family History  Problem Relation Age of Onset   Cancer Father        COLON        Objective:   Physical Exam Vitals reviewed.  Constitutional:      General: She is not in acute distress.    Appearance: She is well-developed. She is obese.  HENT:     Head: Normocephalic and atraumatic.     Right Ear: Tympanic membrane normal.     Left Ear: Tympanic membrane normal.  Eyes:     Pupils: Pupils are equal, round, and reactive to light.  Neck:     Thyroid: No thyromegaly.  Cardiovascular:     Rate and Rhythm:  Normal rate and regular rhythm.     Heart sounds: Normal heart sounds. No murmur heard. Pulmonary:     Effort: Pulmonary effort is normal. No respiratory distress.     Breath sounds: Normal breath sounds. No wheezing.  Abdominal:     General: Bowel sounds are normal. There is no distension.     Palpations: Abdomen is soft.     Tenderness: There is no abdominal tenderness.  Musculoskeletal:        General: Tenderness present. Normal range of motion.     Cervical back: Normal range of motion and neck supple.  Skin:    General: Skin is warm and dry.  Neurological:     Mental Status: She is alert and oriented to person, place, and time.     Cranial Nerves: No cranial nerve deficit.     Deep Tendon Reflexes: Reflexes are normal and symmetric.  Psychiatric:        Behavior:  Behavior normal.        Thought Content: Thought content normal.        Judgment: Judgment normal.       BP 112/80   Pulse 81   Temp (!) 97.1 F (36.2 C)   Ht 5' 6 (1.676 m)   Wt 227 lb 3.2 oz (103.1 kg)   BMI 36.67 kg/m      Assessment & Plan:  Kristin Saunders comes in today with chief complaint of Foot Pain and Dysuria   Diagnosis and orders addressed:  1. Dysuria - CMP14+EGFR - CBC with Differential/Platelet - Urinalysis, Complete - Urine Culture  2. Annual physical exam (Primary) - CMP14+EGFR - CBC with Differential/Platelet - Lipid panel - TSH  3. Obesity (BMI 30-39.9) - CMP14+EGFR - CBC with Differential/Platelet  4. Right foot pain Start diclofenac  BID with food No other NSAID's  Wear good shoe support  Ice - CMP14+EGFR - diclofenac  (VOLTAREN ) 75 MG EC tablet; Take 1 tablet (75 mg total) by mouth 2 (two) times daily.  Dispense: 60 tablet; Refill: 1  5. Acute cystitis without hematuria Force fluids AZO over the counter X2 days RTO prn Culture pending - cephALEXin  (KEFLEX ) 500 MG capsule; Take 1 capsule (500 mg total) by mouth 2 (two) times daily.  Dispense: 14 capsule; Refill: 0   Labs pending Continue current medications  Keep follow up with specialists  Health Maintenance reviewed Diet and exercise encouraged  Return if symptoms worsen or fail to improve.    Kristin Learn, FNP

## 2024-05-13 ENCOUNTER — Ambulatory Visit: Payer: Self-pay | Admitting: Family

## 2024-05-13 ENCOUNTER — Ambulatory Visit: Payer: Self-pay

## 2024-05-13 LAB — CMP14+EGFR
ALT: 13 IU/L (ref 0–32)
AST: 10 IU/L (ref 0–40)
Albumin: 3.7 g/dL — AB (ref 3.9–4.9)
Alkaline Phosphatase: 112 IU/L (ref 41–116)
BUN/Creatinine Ratio: 19 (ref 9–23)
BUN: 13 mg/dL (ref 6–20)
Bilirubin Total: 0.5 mg/dL (ref 0.0–1.2)
CO2: 24 mmol/L (ref 20–29)
Calcium: 8.9 mg/dL (ref 8.7–10.2)
Chloride: 106 mmol/L (ref 96–106)
Creatinine, Ser: 0.69 mg/dL (ref 0.57–1.00)
Globulin, Total: 2.4 g/dL (ref 1.5–4.5)
Glucose: 87 mg/dL (ref 70–99)
Potassium: 4.1 mmol/L (ref 3.5–5.2)
Sodium: 138 mmol/L (ref 134–144)
Total Protein: 6.1 g/dL (ref 6.0–8.5)
eGFR: 118 mL/min/1.73 (ref >59)

## 2024-05-13 LAB — CBC WITH DIFFERENTIAL/PLATELET
Basophils Absolute: 0 x10E3/uL (ref 0.0–0.2)
Basos: 0 %
EOS (ABSOLUTE): 0 x10E3/uL (ref 0.0–0.4)
Eos: 1 %
Hematocrit: 42.8 % (ref 34.0–46.6)
Hemoglobin: 14.1 g/dL (ref 11.1–15.9)
Immature Grans (Abs): 0 x10E3/uL (ref 0.0–0.1)
Immature Granulocytes: 0 %
Lymphocytes Absolute: 1.3 x10E3/uL (ref 0.7–3.1)
Lymphs: 19 %
MCH: 32.2 pg (ref 26.6–33.0)
MCHC: 32.9 g/dL (ref 31.5–35.7)
MCV: 98 fL — ABNORMAL HIGH (ref 79–97)
Monocytes Absolute: 0.4 x10E3/uL (ref 0.1–0.9)
Monocytes: 5 %
Neutrophils Absolute: 5.2 x10E3/uL (ref 1.4–7.0)
Neutrophils: 75 %
Platelets: 270 x10E3/uL (ref 150–450)
RBC: 4.38 x10E6/uL (ref 3.77–5.28)
RDW: 12.7 % (ref 11.7–15.4)
WBC: 6.9 x10E3/uL (ref 3.4–10.8)

## 2024-05-13 LAB — TSH: TSH: 1.11 u[IU]/mL (ref 0.450–4.500)

## 2024-05-13 LAB — LIPID PANEL
Cholesterol, Total: 171 mg/dL (ref 100–199)
HDL: 49 mg/dL (ref >39)
LDL CALC COMMENT:: 3.5 ratio (ref 0.0–4.4)
LDL Chol Calc (NIH): 103 mg/dL — AB (ref 0–99)
Triglycerides: 103 mg/dL (ref 0–149)
VLDL Cholesterol Cal: 19 mg/dL (ref 5–40)

## 2024-05-13 NOTE — Telephone Encounter (Signed)
 FYI Only or Action Required?: FYI only for provider.  Patient was last seen in primary care on 05/12/2024 by Lavell Bari LABOR, FNP.  Called Nurse Triage reporting Results.  Note read to patient from provider.   Triage Disposition: Information or Advice Only Call  Patient/caregiver understands and will follow disposition?: Yes  Copied from CRM [id number]. Topic: Clinical - Lab/Test Results >> May 13, 2024  9:21 AM Kristin Saunders wrote: Reason for CRM: Patient has further questions about her labs Reason for Disposition  [1] Other NON-URGENT information for PCP AND [2] does not require PCP response  Answer Assessment - Initial Assessment Questions 1. REASON FOR CALL or QUESTION: What is your reason for calling today? or How can I best     Patient called to received lab results. Provider note was read to patient. Patient verbalized understanding and states no further questions 2. CALLER: Document the source of call. (e.g., laboratory staff, caregiver or patient).     patient  Protocols used: PCP Call - No Triage-A-[facility]

## 2024-05-14 ENCOUNTER — Telehealth: Payer: Self-pay

## 2024-05-14 ENCOUNTER — Other Ambulatory Visit (HOSPITAL_COMMUNITY): Payer: Self-pay

## 2024-05-14 LAB — URINE CULTURE

## 2024-05-14 NOTE — Telephone Encounter (Signed)
 Pharmacy Patient Advocate Encounter  Received notification from [employer] that Prior Authorization for Diclofenac  Sodium 75MG  dr tablets  has been APPROVED from 05/14/24 to 05/14/25. Ran test claim, Copay is $4. This test claim was processed through [facility]- copay amounts may vary at other pharmacies due to pharmacy/plan contracts, or as the patient moves through the different stages of their insurance plan.   PA #/Case ID/Reference #: [id number]

## 2024-05-26 ENCOUNTER — Other Ambulatory Visit (HOSPITAL_COMMUNITY): Payer: Self-pay

## 2024-06-23 ENCOUNTER — Encounter: Payer: Self-pay | Admitting: Radiology

## 2024-06-29 DIAGNOSIS — Z041 Encounter for examination and observation following transport accident: Secondary | ICD-10-CM | POA: Diagnosis not present

## 2024-06-29 DIAGNOSIS — S62022A Displaced fracture of middle third of navicular [scaphoid] bone of left wrist, initial encounter for closed fracture: Secondary | ICD-10-CM | POA: Diagnosis not present

## 2024-06-29 DIAGNOSIS — S62025A Nondisplaced fracture of middle third of navicular [scaphoid] bone of left wrist, initial encounter for closed fracture: Secondary | ICD-10-CM | POA: Diagnosis not present

## 2024-06-29 DIAGNOSIS — W1839XA Other fall on same level, initial encounter: Secondary | ICD-10-CM | POA: Diagnosis not present
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
## 2024-07-01 ENCOUNTER — Encounter: Payer: Self-pay | Admitting: Family Medicine

## 2024-07-01 ENCOUNTER — Ambulatory Visit: Admitting: Family Medicine

## 2024-07-01 VITALS — BP 94/63 | HR 86 | Temp 97.8°F | Ht 66.0 in | Wt 230.0 lb

## 2024-07-01 DIAGNOSIS — S62002A Unspecified fracture of navicular [scaphoid] bone of left wrist, initial encounter for closed fracture: Secondary | ICD-10-CM | POA: Diagnosis not present

## 2024-07-01 DIAGNOSIS — F32A Depression, unspecified: Secondary | ICD-10-CM

## 2024-07-01 MED ORDER — IBUPROFEN 800 MG PO TABS: 800.0000 mg | ORAL_TABLET | Freq: Three times a day (TID) | ORAL | 0 refills | Status: AC | PRN
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
## 2024-07-01 NOTE — Patient Instructions (Signed)
 Keep the thumb in the brace until seen by orthopedics.   [phone] [place], [place] [address]  [facility] 9 AM to 5 PM   Fractured scaphoid bone of left hand

## 2024-07-04 ENCOUNTER — Telehealth: Payer: Self-pay | Admitting: Family Medicine

## 2024-07-04 NOTE — Telephone Encounter (Signed)
 If patient believes she can still do her job duties without injury to her hand, I am ok if she works. If she wants note, ok to give to her.

## 2024-07-04 NOTE — Telephone Encounter (Signed)
 [facility] [employer]acks for acute visit. Please advise on work

## 2024-07-04 NOTE — Telephone Encounter (Unsigned)
 Copied from CRM #[id number]. Topic: Clinical - Medical Advice >> Jul 04, 2024 11:26 AM Avram MATSU wrote: Reason for CRM: patient has an appt to see a hand specialist and wanted to know if she's ok to go back to work until then. Please advise [phone] (M)

## 2024-07-04 NOTE — Telephone Encounter (Signed)
 Copied from CRM #[id number]. Topic: Clinical - Medical Advice >> Jul 04, 2024 11:26 AM Avram MATSU wrote: Reason for CRM: patient has an appt to see a hand specialist and wanted to know if she's ok to go back to work until then. Please advise [phone] (M)

## 2024-07-04 NOTE — Telephone Encounter (Signed)
 Patient states she has to use her hands all the time unable to work with out injuring it. Letter written to be out of work until 11/19 to see the hand specialists. Patient aware note is up front for her to pick up.

## 2024-07-09 ENCOUNTER — Ambulatory Visit: Admitting: Orthopedic Surgery

## 2024-07-09 ENCOUNTER — Other Ambulatory Visit: Payer: Self-pay

## 2024-07-09 DIAGNOSIS — M25532 Pain in left wrist: Secondary | ICD-10-CM

## 2024-07-09 DIAGNOSIS — S62025A Nondisplaced fracture of middle third of navicular [scaphoid] bone of left wrist, initial encounter for closed fracture: Secondary | ICD-10-CM
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
## 2024-07-24 ENCOUNTER — Telehealth: Payer: Self-pay | Admitting: Family
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
## 2024-08-01 ENCOUNTER — Telehealth: Payer: Self-pay | Admitting: Family

## 2024-08-01 ENCOUNTER — Encounter: Payer: Self-pay | Admitting: Family

## 2024-08-01 ENCOUNTER — Ambulatory Visit: Admitting: Family

## 2024-08-01 VITALS — BP 94/50 | HR 75 | Temp 97.5°F | Ht 66.0 in | Wt 232.4 lb

## 2024-08-01 DIAGNOSIS — M25532 Pain in left wrist: Secondary | ICD-10-CM

## 2024-08-01 DIAGNOSIS — S62002D Unspecified fracture of navicular [scaphoid] bone of left wrist, subsequent encounter for fracture with routine healing: Secondary | ICD-10-CM | POA: Diagnosis not present

## 2024-08-01 DIAGNOSIS — Z0279 Encounter for issue of other medical certificate: Secondary | ICD-10-CM

## 2024-08-01 DIAGNOSIS — Z09 Encounter for follow-up examination after completed treatment for conditions other than malignant neoplasm: Secondary | ICD-10-CM | POA: Diagnosis not present

## 2024-08-01 DIAGNOSIS — S62002S Unspecified fracture of navicular [scaphoid] bone of left wrist, sequela: Secondary | ICD-10-CM | POA: Diagnosis not present

## 2024-08-01 NOTE — Telephone Encounter (Signed)
 Kristin Saunders dropped off FMLA forms to be completed and signed.  Form Fee Paid? (Y/N)       y     If NO, form is placed on front office manager desk to hold until payment received. If YES, then form will be placed in the RX/HH Nurse Coordinators box for completion.  Form will not be processed until payment is received
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
## 2024-08-01 NOTE — Patient Instructions (Signed)
 Wrist Fracture Treated With Immobilization  A wrist fracture is a break or crack in one of the bones of the wrist. A broken wrist is often treated by wearing a cast, splint, or sling (immobilization). These devices hold the broken pieces in place so they can heal. What are the causes? This condition may be caused by: A direct hit on the wrist. Injury, such as a car crash or falling on an outstretched hand. Bone conditions, such as osteoporosis. Falls. This happens more often to older adults. What increases the risk? You're more likely to get this condition if: You do contact sports or high-risk sports such as: Skiing. Biking. Ice skating. You take steroids. You've had a fracture before. You're female. You're older. You smoke. You drink more than three alcoholic beverages a day. What are the signs or symptoms? Severe pain that might get worse when gripping, squeezing, or moving your hand or wrist. Swelling and tenderness. Bruising. Not being able to move the wrist or hand normally. The wrist hanging in an odd position or looking oddly shaped. Numbness or tingling in the fingers. How is this diagnosed? This condition may be diagnosed based on medical history and a physical exam. You may also have tests such as: X-rays. CT scan. MRI. How is this treated? This condition may be treated by: Wearing a cast, splint for 5 to 6 weeks. Taking medicine for pain. Doing exercises. Follow these instructions at home: If you have a cast: Do not put pressure on any part of the cast until it's hard. This may take a few hours. Do not stick anything inside it to scratch your skin. Doing this can lead to infection. Check the skin around your cast every day. Tell your health care provider if you see problems. It's OK to put lotion on dry skin around the cast. Keep the cast clean and dry. If you have a splint: Wear the splint as told by your provider. Take it off only at the times your  provider says you can. Check the skin around it every day. Loosen the splint if your fingers tingle, are numb, or turn cold and blue. Keep the splint clean and dry. Bathing Do not take baths, swim, or use a hot tub until you're told it's OK. Ask if you can shower. If your cast or splint isn't waterproof: Do not let it get wet. Cover it when you take a bath or shower. Use a cover that doesn't let any water in. Managing pain, stiffness, and swelling  Use ice or an ice pack as told. If you have a splint or sling that you can take off, remove it only as told. Place a towel between your skin and the ice, or between your cast and the ice. Leave the ice on for 20 minutes, 2-3 times a day. If your skin turns red, take off the ice right away to prevent skin damage. The risk of damage is higher if you can't feel pain, heat, or cold. Move your fingers often to reduce stiffness and swelling. Raise the injured area above the level of your heart while you are sitting or lying down. Use pillows as needed. Activity Do not lift with your injured wrist, or put weight on it, until your provider says it's safe to do so. Exercise as told. Ask when it's safe to drive if you have a cast, splint, or sling on your wrist. Ask what things are safe for you to do at home. Ask when you can  go back to work or school. Medicines Take your medicines only as told. You may need to take steps to help treat or prevent trouble pooping (constipation), such as: Taking medicines to help you poop. Eating foods high in fiber, like beans, whole grains, and fresh fruits and vegetables. Drinking more fluids as told. Ask your provider if it's safe to drive or use machines while taking pain medicine. General instructions Do not smoke, vape, or use nicotine or tobacco. Doing this can slow down healing. Contact a health care provider if: Your cast or splint is loose or damaged. You have any new pain, swelling, or bruising. Your  pain, swelling, and bruising do not get better. You have a fever or chills. Get help right away if: Your skin or fingers on your injured arm turn blue or gray. Your arm feels cold or numb. You have very bad pain in your injured wrist. These symptoms may be an emergency. Call 911 right away Do not wait to see if the symptoms will go away. Do not drive yourself to the hospital. This information is not intended to replace advice given to you by your health care provider. Make sure you discuss any questions you have with your health care provider. Document Revised: 06/26/2023 Document Reviewed: 02/07/2023 Elsevier Patient Education  2024 [employer].

## 2024-08-11 ENCOUNTER — Ambulatory Visit: Admitting: Orthopedic Surgery

## 2024-08-29 NOTE — Telephone Encounter (Signed)
 PCP completed and signed FMLA forms. They have been faxed to [employer] at fax number [phone]. Patient has been contacted and informed they are complete.

## 2024-09-03 ENCOUNTER — Other Ambulatory Visit: Payer: Self-pay

## 2024-09-03 ENCOUNTER — Ambulatory Visit: Admitting: Orthopedic Surgery

## 2024-09-03 DIAGNOSIS — M25532 Pain in left wrist: Secondary | ICD-10-CM | POA: Diagnosis not present

## 2024-09-03 NOTE — Progress Notes (Signed)
 "  Kristin Saunders - 34 y.o. female MRN [id number]  Date of birth: [birth year]  Office Visit Note: Visit Date: 09/03/2024 PCP: Lavell Bari LABOR, FNP Referred by: Lavell Bari LABOR, FNP  Subjective: No chief complaint on file.  HPI: Kristin Saunders is a pleasant 34 y.o. female who returns today for follow-up of a scaphoid fracture sustained approximately 2 months prior.  She has been compliant with thumb spica casting as instructed.  States that pain has improved significantly.   Pertinent ROS were reviewed with the patient and found to be negative unless otherwise specified above in HPI.       Assessment & Plan: Visit Diagnoses:  1. Pain in left wrist     Plan: Extensive discussion was had with the patient today regarding her left wrist injury.  Repeat x-rays were obtained today which once again show a nondisplaced fracture of the left scaphoid waist with appropriately healing.  We discussed the underlying etiology and pathophysiology of his condition as well as the poor blood supply to the scaphoid bone which can often preclude healing or delayed healing.  We discussed the importance for prolonged immobilization should we use conservative treatment modalities.  We also discussed surgical treatment modalities in the form of open reduction internal fixation of the left scaphoid.  Risk and benefits of both conservative versus surgical treatment measures were discussed in detail today as well as the standard postoperative protocol.    Understanding risks and benefits, patient would like to continue with conservative treatments.  She was transition to a removable thumb spica cast today.  She will follow-up in approximately 1 month for repeat clinical and radiographic check.   Follow-up: No follow-ups on file.   Meds & Orders: No orders of the defined types were placed in this encounter.   Orders Placed This Encounter  Procedures   XR Wrist Complete Left     Procedures: No procedures  performed      Clinical History: No specialty comments available.  She reports that she has never smoked. She has never used smokeless tobacco. No results for input(s): HGBA1C, LABURIC in the last 8760 hours.  Objective:   Vital Signs: There were no vitals taken for this visit.  Physical Exam  Gen: Well-appearing, in no acute distress; non-toxic CV: Regular Rate. Well-perfused. Warm.  Resp: Breathing unlabored on room air; no wheezing. Psych: Fluid speech in conversation; appropriate affect; normal thought process  Ortho Exam Left wrist: - Minimal pain elicited over the anatomic snuffbox and scaphoid tubercle, wrist flexion/extension 45/35, pain at extremes of motion Skin is intact, mild swelling, sensation intact distally median/radial/ulnar distributions, AIN/PIN incision Ross is intact, hand remains warm well-perfused   Imaging: XR Wrist Complete Left Result Date: 09/04/2024 X-rays of the left wrist demonstrate still appearance of the previously known scaphoid waist fracture, fracture remains nondisplaced in nature.  There is evidence of bony consolidation in comparison to prior films.    Past Medical/Family/Surgical/Social History: Medications & Allergies reviewed per EMR, new medications updated. 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   Obesity (BMI 30-39.9) 03/19/2018   Past Medical  History:  Diagnosis Date   Anxiety    Arthritis    Depression    GERD (gastroesophageal reflux disease)    Headache    Medical history non-contributory    Family History   Problem Relation Age of Onset   Cancer Father        COLON   Past Surgical History:  Procedure Laterality Date   ANTERIOR CERVICAL DECOMP/DISCECTOMY FUSION N/A 02/07/2022   Procedure: ANTERIOR CERVICAL DISCECTOMY AND  FUSION C3-4,  C4-5 WITH PLATE, SCREWS, LOCAL BONE GRAFT AND VIVIGEN;  Surgeon: Lucilla Lynwood BRAVO, MD;  Location: [facility] OR;  Service: Orthopedics;  Laterality: N/A;   CERVICAL DISC ARTHROPLASTY N/A 02/07/2022   Procedure: TOTAL CERVICAL DISC ARTHROPLASTY C5-6;  Surgeon: Lucilla Lynwood BRAVO, MD;  Location: [facility] OR;  Service: Orthopedics;  Laterality: N/A;   NO PAST SURGERIES     TUBAL LIGATION     Social History   Occupational History   Not on file  Tobacco Use   Smoking status: Never   Smokeless tobacco: Never  Vaping Use   Vaping status: Never Used  Substance and Sexual Activity   Alcohol use: Not Currently   Drug use: No   Sexual activity: Yes    Birth control/protection: None, Surgical    Tiyana Galla Estela) Arlinda, M.D. [facility], Hand Surgery   "

## 2024-10-13 ENCOUNTER — Ambulatory Visit: Admitting: Orthopedic Surgery
